# Patient Record
Sex: Female | Born: 1940 | Race: White | Hispanic: No | Marital: Married | Smoking: Former smoker
Health system: Southern US, Community
[De-identification: ages and names within clinical notes are randomized; demographics above are authoritative.]

## PROBLEM LIST (undated history)

## (undated) DIAGNOSIS — E039 Hypothyroidism, unspecified: Secondary | ICD-10-CM

## (undated) DIAGNOSIS — K5790 Diverticulosis of intestine, part unspecified, without perforation or abscess without bleeding: Secondary | ICD-10-CM

## (undated) DIAGNOSIS — C4491 Basal cell carcinoma of skin, unspecified: Secondary | ICD-10-CM

## (undated) DIAGNOSIS — D649 Anemia, unspecified: Secondary | ICD-10-CM

## (undated) DIAGNOSIS — I509 Heart failure, unspecified: Secondary | ICD-10-CM

## (undated) DIAGNOSIS — G629 Polyneuropathy, unspecified: Secondary | ICD-10-CM

## (undated) DIAGNOSIS — M1990 Unspecified osteoarthritis, unspecified site: Secondary | ICD-10-CM

## (undated) DIAGNOSIS — Z9289 Personal history of other medical treatment: Secondary | ICD-10-CM

## (undated) DIAGNOSIS — K219 Gastro-esophageal reflux disease without esophagitis: Secondary | ICD-10-CM

## (undated) DIAGNOSIS — R112 Nausea with vomiting, unspecified: Secondary | ICD-10-CM

## (undated) DIAGNOSIS — I2510 Atherosclerotic heart disease of native coronary artery without angina pectoris: Secondary | ICD-10-CM

## (undated) DIAGNOSIS — Z9889 Other specified postprocedural states: Secondary | ICD-10-CM

---

## 1968-10-09 HISTORY — PX: THYROIDECTOMY: SHX17

## 1970-02-08 HISTORY — PX: ABDOMINAL HYSTERECTOMY: SHX81

## 1988-10-09 HISTORY — PX: BASAL CELL CARCINOMA EXCISION: SHX1214

## 2008-02-09 HISTORY — PX: CARDIAC CATHETERIZATION: SHX172

## 2008-02-09 HISTORY — PX: PERICARDIUM SURGERY: SHX742

## 2008-02-09 HISTORY — PX: CORONARY ARTERY BYPASS GRAFT: SHX141

## 2008-03-05 ENCOUNTER — Ambulatory Visit: Payer: Self-pay | Admitting: Thoracic Surgery (Cardiothoracic Vascular Surgery)

## 2008-04-29 ENCOUNTER — Ambulatory Visit: Payer: Self-pay | Admitting: Cardiothoracic Surgery

## 2008-05-27 ENCOUNTER — Ambulatory Visit: Payer: Self-pay | Admitting: Thoracic Surgery (Cardiothoracic Vascular Surgery)

## 2013-07-09 ENCOUNTER — Emergency Department (HOSPITAL_COMMUNITY): Payer: Medicare Other

## 2013-07-09 ENCOUNTER — Encounter (HOSPITAL_COMMUNITY): Payer: Self-pay | Admitting: Emergency Medicine

## 2013-07-09 ENCOUNTER — Inpatient Hospital Stay (HOSPITAL_COMMUNITY)
Admission: EM | Admit: 2013-07-09 | Discharge: 2013-07-26 | DRG: 291 | Disposition: A | Discharge status: Home / Self Care | Payer: Medicare Other | Attending: Internal Medicine | Admitting: Internal Medicine

## 2013-07-09 DIAGNOSIS — Z951 Presence of aortocoronary bypass graft: Secondary | ICD-10-CM

## 2013-07-09 DIAGNOSIS — E662 Morbid (severe) obesity with alveolar hypoventilation: Secondary | ICD-10-CM | POA: Diagnosis present

## 2013-07-09 DIAGNOSIS — I5033 Acute on chronic diastolic (congestive) heart failure: Principal | ICD-10-CM | POA: Diagnosis present

## 2013-07-09 DIAGNOSIS — J9600 Acute respiratory failure, unspecified whether with hypoxia or hypercapnia: Secondary | ICD-10-CM | POA: Diagnosis present

## 2013-07-09 DIAGNOSIS — E890 Postprocedural hypothyroidism: Secondary | ICD-10-CM | POA: Diagnosis present

## 2013-07-09 DIAGNOSIS — I2510 Atherosclerotic heart disease of native coronary artery without angina pectoris: Secondary | ICD-10-CM | POA: Diagnosis present

## 2013-07-09 DIAGNOSIS — D649 Anemia, unspecified: Secondary | ICD-10-CM | POA: Diagnosis present

## 2013-07-09 DIAGNOSIS — I509 Heart failure, unspecified: Secondary | ICD-10-CM | POA: Diagnosis present

## 2013-07-09 DIAGNOSIS — E876 Hypokalemia: Secondary | ICD-10-CM | POA: Diagnosis present

## 2013-07-09 DIAGNOSIS — G4733 Obstructive sleep apnea (adult) (pediatric): Secondary | ICD-10-CM | POA: Diagnosis present

## 2013-07-09 DIAGNOSIS — I10 Essential (primary) hypertension: Secondary | ICD-10-CM | POA: Diagnosis present

## 2013-07-09 DIAGNOSIS — Z87891 Personal history of nicotine dependence: Secondary | ICD-10-CM

## 2013-07-09 DIAGNOSIS — Z6826 Body mass index (BMI) 26.0-26.9, adult: Secondary | ICD-10-CM

## 2013-07-09 DIAGNOSIS — Z952 Presence of prosthetic heart valve: Secondary | ICD-10-CM

## 2013-07-09 DIAGNOSIS — I959 Hypotension, unspecified: Secondary | ICD-10-CM | POA: Diagnosis present

## 2013-07-09 DIAGNOSIS — I050 Rheumatic mitral stenosis: Secondary | ICD-10-CM

## 2013-07-09 DIAGNOSIS — I5043 Acute on chronic combined systolic (congestive) and diastolic (congestive) heart failure: Secondary | ICD-10-CM

## 2013-07-09 DIAGNOSIS — E039 Hypothyroidism, unspecified: Secondary | ICD-10-CM

## 2013-07-09 DIAGNOSIS — J9601 Acute respiratory failure with hypoxia: Secondary | ICD-10-CM

## 2013-07-09 DIAGNOSIS — Z880 Allergy status to penicillin: Secondary | ICD-10-CM

## 2013-07-09 DIAGNOSIS — G609 Hereditary and idiopathic neuropathy, unspecified: Secondary | ICD-10-CM | POA: Diagnosis present

## 2013-07-09 DIAGNOSIS — G589 Mononeuropathy, unspecified: Secondary | ICD-10-CM | POA: Diagnosis present

## 2013-07-09 LAB — COMPREHENSIVE METABOLIC PANEL
ALT: 10 U/L (ref 0–35)
AST: 27 U/L (ref 0–37)
Albumin: 3.2 g/dL — ABNORMAL LOW (ref 3.5–5.2)
Alkaline Phosphatase: 148 U/L — ABNORMAL HIGH (ref 39–117)
BUN: 13 mg/dL (ref 6–23)
CALCIUM: 8.7 mg/dL (ref 8.4–10.5)
CO2: 19 meq/L (ref 19–32)
Chloride: 98 mEq/L (ref 96–112)
Creatinine, Ser: 0.75 mg/dL (ref 0.50–1.10)
GFR, EST NON AFRICAN AMERICAN: 82 mL/min — AB (ref >90)
GLUCOSE: 92 mg/dL (ref 70–99)
Potassium: 4.2 mEq/L (ref 3.7–5.3)
SODIUM: 133 meq/L — AB (ref 137–147)
Total Bilirubin: 1.2 mg/dL (ref 0.3–1.2)
Total Protein: 6.7 g/dL (ref 6.0–8.3)

## 2013-07-09 LAB — CBC
HCT: 26 % — ABNORMAL LOW (ref 36.0–46.0)
Hemoglobin: 8.1 g/dL — ABNORMAL LOW (ref 12.0–15.0)
MCH: 27.5 pg (ref 26.0–34.0)
MCHC: 31.2 g/dL (ref 30.0–36.0)
MCV: 88.1 fL (ref 78.0–100.0)
Platelets: 156 10*3/uL (ref 150–400)
RBC: 2.95 MIL/uL — ABNORMAL LOW (ref 3.87–5.11)
RDW: 22 % — AB (ref 11.5–15.5)
WBC: 4.8 10*3/uL (ref 4.0–10.5)

## 2013-07-09 LAB — CBC WITH DIFFERENTIAL/PLATELET
BASOS PCT: 0 % (ref 0–1)
Basophils Absolute: 0 10*3/uL (ref 0.0–0.1)
EOS PCT: 2 % (ref 0–5)
Eosinophils Absolute: 0.1 10*3/uL (ref 0.0–0.7)
HEMATOCRIT: 28.2 % — AB (ref 36.0–46.0)
HEMOGLOBIN: 8.7 g/dL — AB (ref 12.0–15.0)
LYMPHS ABS: 1.1 10*3/uL (ref 0.7–4.0)
Lymphocytes Relative: 23 % (ref 12–46)
MCH: 27.6 pg (ref 26.0–34.0)
MCHC: 30.9 g/dL (ref 30.0–36.0)
MCV: 89.5 fL (ref 78.0–100.0)
MONO ABS: 0.5 10*3/uL (ref 0.1–1.0)
Monocytes Relative: 11 % (ref 3–12)
NEUTROS ABS: 3.1 10*3/uL (ref 1.7–7.7)
NEUTROS PCT: 64 % (ref 43–77)
Platelets: 149 10*3/uL — ABNORMAL LOW (ref 150–400)
RBC: 3.15 MIL/uL — AB (ref 3.87–5.11)
RDW: 21.9 % — ABNORMAL HIGH (ref 11.5–15.5)
WBC: 4.8 10*3/uL (ref 4.0–10.5)

## 2013-07-09 LAB — D-DIMER, QUANTITATIVE (NOT AT ARMC): D DIMER QUANT: 2.08 ug{FEU}/mL — AB (ref 0.00–0.48)

## 2013-07-09 LAB — TROPONIN I
Troponin I: <0.3 ng/mL (ref <0.30)
Troponin I: <0.3 ng/mL (ref <0.30)

## 2013-07-09 LAB — LIPID PANEL
Cholesterol: 156 mg/dL (ref 0–200)
HDL: 34 mg/dL — ABNORMAL LOW (ref >39)
LDL Cholesterol: 104 mg/dL — ABNORMAL HIGH (ref 0–99)
Total CHOL/HDL Ratio: 4.6 RATIO
Triglycerides: 89 mg/dL (ref <150)
VLDL: 18 mg/dL (ref 0–40)

## 2013-07-09 LAB — PRO B NATRIURETIC PEPTIDE: PRO B NATRI PEPTIDE: 1673 pg/mL — AB (ref 0–125)

## 2013-07-09 LAB — CREATININE, SERUM
Creatinine, Ser: 0.76 mg/dL (ref 0.50–1.10)
GFR calc Af Amer: >90 mL/min (ref >90)
GFR calc non Af Amer: 82 mL/min — ABNORMAL LOW (ref >90)

## 2013-07-09 LAB — HEMOGLOBIN A1C
HEMOGLOBIN A1C: 5.1 % (ref <5.7)
MEAN PLASMA GLUCOSE: 100 mg/dL (ref <117)

## 2013-07-09 LAB — TSH: TSH: 7.3 u[IU]/mL — AB (ref 0.350–4.500)

## 2013-07-09 MED ORDER — FERROUS SULFATE 325 (65 FE) MG PO TABS
325.0000 mg | ORAL_TABLET | Freq: Every day | ORAL | Status: DC
  Administered 2013-07-10 – 2013-07-26 (×17): 325 mg via ORAL
  Filled 2013-07-09 (×20): qty 1

## 2013-07-09 MED ORDER — LOSARTAN POTASSIUM 25 MG PO TABS: 25.0000 mg | ORAL_TABLET | Freq: Every day | ORAL | Status: DC

## 2013-07-09 MED ORDER — METOPROLOL TARTRATE 25 MG PO TABS
25.0000 mg | ORAL_TABLET | Freq: Two times a day (BID) | ORAL | Status: DC
  Administered 2013-07-09 – 2013-07-12 (×7): 25 mg via ORAL
  Filled 2013-07-09 (×9): qty 1

## 2013-07-09 MED ORDER — GABAPENTIN 300 MG PO CAPS
300.0000 mg | ORAL_CAPSULE | Freq: Every day | ORAL | Status: DC
  Administered 2013-07-09 – 2013-07-25 (×17): 300 mg via ORAL
  Filled 2013-07-09 (×18): qty 1

## 2013-07-09 MED ORDER — ACETAMINOPHEN 325 MG PO TABS
650.0000 mg | ORAL_TABLET | Freq: Four times a day (QID) | ORAL | Status: DC | PRN
  Administered 2013-07-09 – 2013-07-23 (×20): 650 mg via ORAL
  Filled 2013-07-09 (×20): qty 2

## 2013-07-09 MED ORDER — LISINOPRIL 5 MG PO TABS
5.0000 mg | ORAL_TABLET | Freq: Every day | ORAL | Status: DC
  Filled 2013-07-09: qty 1

## 2013-07-09 MED ORDER — SODIUM CHLORIDE 0.9 % IJ SOLN
3.0000 mL | INTRAMUSCULAR | Status: DC | PRN
  Administered 2013-07-16: 3 mL via INTRAVENOUS

## 2013-07-09 MED ORDER — POTASSIUM CHLORIDE CRYS ER 20 MEQ PO TBCR
20.0000 meq | EXTENDED_RELEASE_TABLET | Freq: Two times a day (BID) | ORAL | Status: DC
  Administered 2013-07-09 – 2013-07-13 (×9): 20 meq via ORAL
  Filled 2013-07-09 (×14): qty 1

## 2013-07-09 MED ORDER — AMIODARONE HCL 200 MG PO TABS
200.0000 mg | ORAL_TABLET | Freq: Every day | ORAL | Status: DC
  Administered 2013-07-10: 200 mg via ORAL
  Filled 2013-07-09: qty 1

## 2013-07-09 MED ORDER — FUROSEMIDE 10 MG/ML IJ SOLN: 40.0000 mg | Freq: Two times a day (BID) | INTRAMUSCULAR | Status: DC

## 2013-07-09 MED ORDER — FUROSEMIDE 10 MG/ML IJ SOLN
40.0000 mg | Freq: Once | INTRAMUSCULAR | Status: AC
  Administered 2013-07-09: 40 mg via INTRAVENOUS
  Filled 2013-07-09: qty 4

## 2013-07-09 MED ORDER — SODIUM CHLORIDE 0.9 % IJ SOLN
3.0000 mL | Freq: Two times a day (BID) | INTRAMUSCULAR | Status: DC
  Administered 2013-07-09 – 2013-07-18 (×13): 3 mL via INTRAVENOUS

## 2013-07-09 MED ORDER — SODIUM CHLORIDE 0.9 % IJ SOLN
3.0000 mL | Freq: Two times a day (BID) | INTRAMUSCULAR | Status: DC
  Administered 2013-07-09 – 2013-07-25 (×24): 3 mL via INTRAVENOUS

## 2013-07-09 MED ORDER — SODIUM CHLORIDE 0.9 % IV SOLN
250.0000 mL | INTRAVENOUS | Status: DC | PRN
  Administered 2013-07-10: 250 mL via INTRAVENOUS

## 2013-07-09 MED ORDER — LEVOTHYROXINE SODIUM 200 MCG PO TABS
200.0000 ug | ORAL_TABLET | Freq: Every day | ORAL | Status: DC
  Administered 2013-07-10: 200 ug via ORAL
  Filled 2013-07-09 (×3): qty 1

## 2013-07-09 MED ORDER — HEPARIN SODIUM (PORCINE) 5000 UNIT/ML IJ SOLN
5000.0000 [IU] | Freq: Three times a day (TID) | INTRAMUSCULAR | Status: DC
  Administered 2013-07-09 – 2013-07-25 (×48): 5000 [IU] via SUBCUTANEOUS
  Filled 2013-07-09 (×56): qty 1

## 2013-07-09 NOTE — ED Notes (Signed)
Ordered diet tray 

## 2013-07-09 NOTE — ED Provider Notes (Signed)
CSN: [id number]     Arrival date & time 07/09/13  1412 History   First MD Initiated Contact with Patient 07/09/13 1509     Chief Complaint  Patient presents with  . Shortness of Breath  . Leg Swelling     (Consider location/radiation/quality/duration/timing/severity/associated sxs/prior Treatment) HPI Comments: Patient is a 73 year old female with history of aortic valve replacement and congestive heart failure. She presents today with complaints of shortness of breath and increased lower extremity swelling. This has been occurring intermittently over the past 6 weeks. Daughter reports that she has been hospitalized at [facility] 4 times during this period of time. She states that she is given Lasix and her swelling improves. She'll after she arrives home the swelling increases and she becomes more short of breath. Her cardiologist is Dr. Sherrian Divers at [facility]. They came here today as they seem frustrated with a lack of explanation for why her swelling continues to come back.  Patient is a 73 y.o. female presenting with shortness of breath. The history is provided by the patient and a relative.  Shortness of Breath Severity:  Moderate Onset quality:  Gradual Duration:  2 days Timing:  Constant Progression:  Worsening Chronicity:  New Context: activity   Relieved by:  Nothing Worsened by:  Nothing tried Ineffective treatments:  None tried   No past medical history on file. No past surgical history on file. No family history on file. History  Substance Use Topics  . Smoking status: Not on file  . Smokeless tobacco: Not on file  . Alcohol Use: Not on file   OB History   No data available     Review of Systems  Respiratory: Positive for shortness of breath.   All other systems reviewed and are negative.     Allergies  Penicillins  Home Medications   Prior to Admission medications   Medication Sig Start Date End Date Taking? Authorizing Provider   amiodarone (PACERONE) 200 MG tablet Take 200 mg by mouth daily. 07/06/13  Yes Historical Provider, MD  ferrous sulfate 325 (65 FE) MG tablet Take 325 mg by mouth daily with breakfast.   Yes Historical Provider, MD  furosemide (LASIX) 40 MG tablet Take 20 mg by mouth daily. 07/02/13  Yes Historical Provider, MD  gabapentin (NEURONTIN) 300 MG capsule Take 300 mg by mouth at bedtime. 05/31/13  Yes Historical Provider, MD  levothyroxine (SYNTHROID, LEVOTHROID) 200 MCG tablet Take 200 mcg by mouth daily before breakfast.   Yes Historical Provider, MD  lisinopril (PRINIVIL,ZESTRIL) 5 MG tablet Take 5 mg by mouth daily. 05/10/13  Yes Historical Provider, MD  LORazepam (ATIVAN) 1 MG tablet Take 1 mg by mouth at bedtime as needed. sleep 06/01/13  Yes Historical Provider, MD  losartan (COZAAR) 25 MG tablet Take 25 mg by mouth daily. 06/17/13  Yes Historical Provider, MD  metoprolol tartrate (LOPRESSOR) 25 MG tablet Take 25 mg by mouth 2 (two) times daily. 05/04/13  Yes Historical Provider, MD  potassium chloride SA (K-DUR,KLOR-CON) 20 MEQ tablet Take 20 mEq by mouth 2 (two) times daily. 07/02/13  Yes Historical Provider, MD   BP 108/53  Resp 16  Ht 5' 8.5" (1.74 m)  Wt 214 lb (97.07 kg)  BMI 32.06 kg/m2  SpO2 97% Physical Exam  Nursing note and vitals reviewed. Constitutional: She is oriented to person, place, and time. She appears well-developed and well-nourished. No distress.  HENT:  Head: Normocephalic and atraumatic.  Neck: Normal range of motion. Neck  supple.  Cardiovascular: Normal rate and regular rhythm.  Exam reveals no gallop and no friction rub.   No murmur heard. Pulmonary/Chest: Effort normal and breath sounds normal. No respiratory distress. She has no wheezes.  Abdominal: Soft. Bowel sounds are normal. She exhibits no distension. There is no tenderness.  Musculoskeletal: Normal range of motion. She exhibits edema.  There is 2-3+ bilateral lower extremity edema. There is erythema and warmth  to the anterior aspects of both lower legs. Dorsalis pedis pulses are palpable bilaterally.  Neurological: She is alert and oriented to person, place, and time.  Skin: Skin is warm and dry. She is not diaphoretic.    ED Course  Procedures (including critical care time) Labs Review Labs Reviewed  CBC WITH DIFFERENTIAL  COMPREHENSIVE METABOLIC PANEL  PRO B NATRIURETIC PEPTIDE  TROPONIN I    Imaging Review No results found.   EKG Interpretation   Date/Time:  Monday July 09 2013 14:21:56 EDT Ventricular Rate:  83 PR Interval:  254 QRS Duration: 142 QT Interval:  467 QTC Calculation: 549 R Axis:   98 Text Interpretation:  Sinus rhythm Prolonged PR interval Right bundle  branch block No prior ecg for comparison Confirmed by DELOS  MD, Megon Kalina  ([id number]) on 07/09/2013 3:22:05 PM      MDM   Final diagnoses:  None    Patient is a 72 year old female with history of congestive heart failure. She presents with lower extremity edema and difficulty breathing. She does have a hypoxic with O2 saturations of 90%. This improved with supplemental oxygen. Workup reveals an elevated BNP, negative troponin and chest x-ray that is reflective of a CHF exacerbation. She was given intravenous Lasix with good diuresis. Do to her hypoxia, however I feel as though admission is indicated. I've spoken with Dr. Ernestina Patches from [facility] who agrees to admit the patient.   Veryl Speak, MD 07/10/13 1530

## 2013-07-09 NOTE — ED Notes (Signed)
Pt in from home c/o SOB onset today with bil lower extremity swelling worsening with redness below the knee bilaterally, pt reports having abdominal swelling more so than baseline, pt has lasix 40 mg Rx, daughter reports her taking 20 mg Lasix, pt denies CP, n/v/d, hx of aortic valve replacement, pt A&O x4, follows commands, speaks in complete sentences, pt on 2L [employer] upon arrival to ED

## 2013-07-09 NOTE — Progress Notes (Signed)
Patient c/o generalized pain.  4/10.  MD made aware and tylenol ordered per MD. RN will continue to monitor. Shellee Milo, RN

## 2013-07-09 NOTE — H&P (Signed)
Hospitalist Admission History and Physical  Patient name: Nicole Hansen record number: [id number] Date of birth: [birth year] Age: 73 y.o. Gender: female  Primary Care Provider: Rubie Maid, MD  Chief Complaint: acute resp failure, edema, ? CHF  History of Present Illness:This is a 73 y.o. 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 Patient gets her care to [facility]. Patient states that she was seen back in March for an upper respiratory infection by her primary care physician. Was put on antibiotics and steroids. Patient had a significant amount of weight gain with steroids and has been subsequently hospitalized several times for shortness of breath. Station she's been seen several times by the cardiologist for swelling and dyspnea. States she has had normal echocardiograms. Was instructed to take higher doses of Lasix and she would like. Daughter and patient state that she can only tolerate up to 20 mg of Lasix daily. Otherwise she urinates excessively on the floor. Patient states she's gained up to 50 pounds of weight since initial steroids dosing. Still up with 30-40 pounds with the 20 mg of Lasix. Denies any chest pain. Has had some chest pressure. Positive orthopnea. Positive lower extremity swelling. States that she was also transfused 4 units of blood and [place] within the past month. Had an upper and lower endoscopy that was negative for any bleeding.  Presented to the ER today hemodynamically stable. Pressures in the 100s to 120s. Satting 90-95% on room air. White blood cell count 4.8, hemoglobin 8.7 creatinine 0.75. Pro BNP 1673. Trop WNL.    Assessment and Plan: Nicole Hansen is a 73 y.o. year old female presenting with acute resp failure, edema   Active  Problems:   Acute respiratory failure   Acute Resp Failure/Edema: Overall presentation concerning for CHF flare. Ddx also includes VTE, obstructive lung disease given body habitus and decreased mobility. Prior 20 pack year smoker in remote past (quit in 49s). No wheezing on exam, though rhoncii present. Steroids relatively contraindicated given edema as secondary side effect in the past. Check D dimer. Will diurese with IV lasix in the interim. . Check 2D ECHO. Cardiology consult to reeval overall case. Cycle CEs. Risk stratification labs. Telemetry bed. Supplemental O2 prn.   HTN: Continue home regimen. Hold ARB and ACE in interim pending diurese.   FEN/GI: heart healthy. Low sodium diet.  Prophylaxis: sub q heparin Disposition: pending further evaluation.  Code Status:Full Code    Patient Active Problem List   Diagnosis Date Noted  . Acute respiratory failure 07/09/2013   Past Medical History: No past medical history on file.  Past Surgical History: No past surgical history on file.  Social History: History   Social History  . Marital Status: Married    Spouse Name: N/A    Number of Children: N/A  . Years of Education: N/A   Social History Main Topics  . Smoking status: None  . Smokeless tobacco: None  . Alcohol Use: No  . Drug Use: No  . Sexual Activity: None   Other Topics Concern  . None   Social History Narrative  . None    Family History: No family history on file.  Allergies: Allergies  Allergen Reactions  . Penicillins Rash    Current Facility-Administered Medications  Medication Dose Route Frequency Provider Last Rate Last Dose  . 0.9 %  sodium chloride infusion  250 mL Intravenous PRN Shanda Howells, MD      . amiodarone (PACERONE) tablet 200 mg  200 mg Oral Daily Shanda Howells, MD      . Derrill Memo ON 07/10/2013] ferrous sulfate tablet 325 mg  325 mg Oral Q breakfast Shanda Howells, MD      . Derrill Memo ON 07/10/2013] furosemide (LASIX) injection 40 mg   40 mg Intravenous Q12H Shanda Howells, MD      . gabapentin (NEURONTIN) capsule 300 mg  300 mg Oral QHS Shanda Howells, MD      . heparin injection 5,000 Units  5,000 Units Subcutaneous 3 times per day Shanda Howells, MD      . Derrill Memo ON 07/10/2013] levothyroxine (SYNTHROID, LEVOTHROID) tablet 200 mcg  200 mcg Oral QAC breakfast Shanda Howells, MD      . lisinopril (PRINIVIL,ZESTRIL) tablet 5 mg  5 mg Oral Daily Shanda Howells, MD      . losartan (COZAAR) tablet 25 mg  25 mg Oral Daily Shanda Howells, MD      . metoprolol tartrate (LOPRESSOR) tablet 25 mg  25 mg Oral BID Shanda Howells, MD      . potassium chloride SA (K-DUR,KLOR-CON) CR tablet 20 mEq  20 mEq Oral BID Shanda Howells, MD      . sodium chloride 0.9 % injection 3 mL  3 mL Intravenous Q12H Shanda Howells, MD      . sodium chloride 0.9 % injection 3 mL  3 mL Intravenous Q12H Shanda Howells, MD      . sodium chloride 0.9 % injection 3 mL  3 mL Intravenous PRN Shanda Howells, MD       Current Outpatient Prescriptions  Medication Sig Dispense Refill  . amiodarone (PACERONE) 200 MG tablet Take 200 mg by mouth daily.      . ferrous sulfate 325 (65 FE) MG tablet Take 325 mg by mouth daily with breakfast.      . furosemide (LASIX) 40 MG tablet Take 20 mg by mouth daily.      Marland Kitchen gabapentin (NEURONTIN) 300 MG capsule Take 300 mg by mouth at bedtime.      Marland Kitchen levothyroxine (SYNTHROID, LEVOTHROID) 200 MCG tablet Take 200 mcg by mouth daily before breakfast.      . lisinopril (PRINIVIL,ZESTRIL) 5 MG tablet Take 5 mg by mouth daily.      Marland Kitchen LORazepam (ATIVAN) 1 MG tablet Take 1 mg by mouth at bedtime as needed. sleep      . losartan (COZAAR) 25 MG tablet Take 25 mg by mouth daily.      . metoprolol tartrate (LOPRESSOR) 25 MG tablet Take 25 mg by mouth 2 (two) times daily.      . potassium chloride SA (K-DUR,KLOR-CON) 20 MEQ tablet Take 20 mEq by mouth 2 (two) times daily.       Review Of Systems: 12 point ROS negative except as noted above in HPI.  Physical  Exam: Filed Vitals:   07/09/13 1834  BP: 120/62  Pulse: 88  Resp: 21    General: alert, cooperative, morbidly obese and mild distress HEENT: PERRLA and extra ocular movement intact Heart: S1, S2 normal, no murmur, rub or gallop, regular rate and rhythm Lungs: mild rhoncii diffusely Abdomen: obese abdomen, non tender, + bowel sounds  Extremities: venous stasis dermatitis noted and 2+ peripheral pulses, Skin:venous stasis changes on LEs bilaterally  Neurology: normal without focal findings  Labs and Imaging: Lab Results  Component Value Date/Time   NA  133* 07/09/2013  4:02 PM   K 4.2 07/09/2013  4:02 PM   CL 98 07/09/2013  4:02 PM   CO2 19 07/09/2013  4:02 PM   BUN 13 07/09/2013  4:02 PM   CREATININE 0.75 07/09/2013  4:02 PM   GLUCOSE 92 07/09/2013  4:02 PM   Lab Results  Component Value Date   WBC 4.8 07/09/2013   HGB 8.7* 07/09/2013   HCT 28.2* 07/09/2013   MCV 89.5 07/09/2013   PLT 149* 07/09/2013   EKG: NSR, RBBB   Dg Chest 2 View  07/09/2013   CLINICAL DATA:  Short of breath, increasing lower extremity swelling  EXAM: CHEST  2 VIEW  COMPARISON:  Prior chest x-ray 06/25/2013  FINDINGS: Stable cardiomegaly and mediastinal contours. Atherosclerotic calcifications present within the transverse aorta. Patient is status post median sternotomy with evidence of prior CABG and aortic valve replacement. Pulmonary vascular congestion with mild pulmonary edema but no significant interval progression compared to prior. Small bilateral right larger than left layering pleural effusions with associated bibasilar atelectasis. No acute osseous abnormality.  IMPRESSION: Similar appearance of mild compared to 06/25/2013.  Small right larger than left pleural effusions.   Electronically Signed   By: Jacqulynn Cadet M.D.   On: 07/09/2013 17:42           Shanda Howells MD  Pager: [phone]

## 2013-07-10 ENCOUNTER — Encounter (HOSPITAL_COMMUNITY): Payer: Self-pay | Admitting: Radiology

## 2013-07-10 ENCOUNTER — Observation Stay (HOSPITAL_COMMUNITY): Payer: Medicare Other

## 2013-07-10 DIAGNOSIS — I059 Rheumatic mitral valve disease, unspecified: Secondary | ICD-10-CM

## 2013-07-10 DIAGNOSIS — I959 Hypotension, unspecified: Secondary | ICD-10-CM | POA: Diagnosis present

## 2013-07-10 DIAGNOSIS — I10 Essential (primary) hypertension: Secondary | ICD-10-CM

## 2013-07-10 DIAGNOSIS — J9600 Acute respiratory failure, unspecified whether with hypoxia or hypercapnia: Secondary | ICD-10-CM

## 2013-07-10 LAB — COMPREHENSIVE METABOLIC PANEL
ALT: 9 U/L (ref 0–35)
AST: 24 U/L (ref 0–37)
Albumin: 2.9 g/dL — ABNORMAL LOW (ref 3.5–5.2)
Alkaline Phosphatase: 132 U/L — ABNORMAL HIGH (ref 39–117)
BILIRUBIN TOTAL: 0.9 mg/dL (ref 0.3–1.2)
BUN: 13 mg/dL (ref 6–23)
CHLORIDE: 100 meq/L (ref 96–112)
CO2: 23 meq/L (ref 19–32)
CREATININE: 0.9 mg/dL (ref 0.50–1.10)
Calcium: 8.3 mg/dL — ABNORMAL LOW (ref 8.4–10.5)
GFR calc Af Amer: 72 mL/min — ABNORMAL LOW (ref >90)
GFR, EST NON AFRICAN AMERICAN: 62 mL/min — AB (ref >90)
Glucose, Bld: 93 mg/dL (ref 70–99)
Potassium: 4.4 mEq/L (ref 3.7–5.3)
Sodium: 135 mEq/L — ABNORMAL LOW (ref 137–147)
Total Protein: 5.9 g/dL — ABNORMAL LOW (ref 6.0–8.3)

## 2013-07-10 LAB — CBC WITH DIFFERENTIAL/PLATELET
Basophils Absolute: 0.1 10*3/uL (ref 0.0–0.1)
Basophils Relative: 1 % (ref 0–1)
EOS PCT: 2 % (ref 0–5)
Eosinophils Absolute: 0.1 10*3/uL (ref 0.0–0.7)
HCT: 25 % — ABNORMAL LOW (ref 36.0–46.0)
Hemoglobin: 7.9 g/dL — ABNORMAL LOW (ref 12.0–15.0)
Lymphocytes Relative: 20 % (ref 12–46)
Lymphs Abs: 1 10*3/uL (ref 0.7–4.0)
MCH: 28 pg (ref 26.0–34.0)
MCHC: 31.6 g/dL (ref 30.0–36.0)
MCV: 88.7 fL (ref 78.0–100.0)
MONO ABS: 0.6 10*3/uL (ref 0.1–1.0)
MONOS PCT: 12 % (ref 3–12)
Neutro Abs: 3.4 10*3/uL (ref 1.7–7.7)
Neutrophils Relative %: 65 % (ref 43–77)
PLATELETS: 139 10*3/uL — AB (ref 150–400)
RBC: 2.82 MIL/uL — AB (ref 3.87–5.11)
RDW: 21.8 % — ABNORMAL HIGH (ref 11.5–15.5)
WBC: 5.2 10*3/uL (ref 4.0–10.5)

## 2013-07-10 LAB — TROPONIN I: Troponin I: <0.3 ng/mL (ref <0.30)

## 2013-07-10 MED ORDER — LORAZEPAM 0.5 MG PO TABS
0.5000 mg | ORAL_TABLET | Freq: Once | ORAL | Status: AC
  Administered 2013-07-10: 0.5 mg via ORAL
  Filled 2013-07-10: qty 1

## 2013-07-10 MED ORDER — IOHEXOL 350 MG/ML SOLN
80.0000 mL | Freq: Once | INTRAVENOUS | Status: AC | PRN
  Administered 2013-07-10: 59 mL via INTRAVENOUS

## 2013-07-10 MED ORDER — FUROSEMIDE 10 MG/ML IJ SOLN
40.0000 mg | Freq: Two times a day (BID) | INTRAMUSCULAR | Status: DC
Start: 2013-07-10 — End: 2013-07-10
  Filled 2013-07-10 (×2): qty 4

## 2013-07-10 MED ORDER — DEXTROSE 5 % IV SOLN
8.0000 mg/h | INTRAVENOUS | Status: DC
  Administered 2013-07-10: 4 mg/h via INTRAVENOUS
  Administered 2013-07-11: 8 mg/h via INTRAVENOUS
  Filled 2013-07-10 (×5): qty 25

## 2013-07-10 MED ORDER — GUAIFENESIN-DM 100-10 MG/5ML PO SYRP
5.0000 mL | ORAL_SOLUTION | ORAL | Status: DC | PRN
  Administered 2013-07-10 – 2013-07-25 (×17): 5 mL via ORAL
  Filled 2013-07-10 (×17): qty 5

## 2013-07-10 NOTE — Progress Notes (Signed)
[facility] PROGRESS NOTE  Filed Weights   07/09/13 1421 07/09/13 2129 07/10/13 0505  Weight: 97.07 kg (214 lb) 97.796 kg (215 lb 9.6 oz) 96.888 kg (213 lb 9.6 oz)        Intake/Output Summary (Last 24 hours) at 07/10/13 1101 Last data filed at 07/10/13 0840  Gross per 24 hour  Intake    650 ml  Output    700 ml  Net    -50 ml     Assessment/Plan:  Acute respiratory failure due to HF: - Change to lasix drip, ted hose. Leave pt head down feet up. - daily weight, fluid restriction, low sodium diet. - monitor electrolytes, replete as needed. - She is massively fluid overloaded. - cardiac markers negative x 3.   Essential HTN: - cont home meds.  Code Status: full Family Communication: none  Disposition Plan: inpatient   Consultants:  none  Procedures: ECHO: pending  Antibiotics:  none (indicate start date, and stop date if known)  HPI/Subjective: SOB with ambulation  Objective: Filed Vitals:   07/09/13 1956 07/09/13 2129 07/10/13 0505 07/10/13 1024  BP: 132/76  100/40 107/51  Pulse: 92   82  Temp: 97.5 F (36.4 C)  97.8 F (36.6 C)   TempSrc: Oral  Axillary   Resp: 20  18   Height:  5\' 8"  (1.727 m)    Weight:  97.796 kg (215 lb 9.6 oz) 96.888 kg (213 lb 9.6 oz)   SpO2: 93%  96%      Exam:  General: Alert, awake, oriented x3, in no acute distress.  HEENT: No bruits, no goiter. +JVD Heart: Regular rate and rhythm, 3+ edema, she is also edematous in her abdomen. Lungs: Good air movement, crackles at bases Abdomen: Soft, nontender, nondistended, positive bowel sounds.   Data Reviewed: Basic Metabolic Panel:  Recent Labs Lab 07/09/13 1602 07/09/13 1932 07/10/13 0217  NA 133*  --  135*  K 4.2  --  4.4  CL 98  --  100  CO2 19  --  23  GLUCOSE 92  --  93  BUN 13  --  13  CREATININE 0.75 0.76 0.90  CALCIUM 8.7  --  8.3*   Liver Function Tests:  Recent Labs Lab 07/09/13 1602 07/10/13 0217  AST 27 24  ALT 10 9  ALKPHOS 148*  132*  BILITOT 1.2 0.9  PROT 6.7 5.9*  ALBUMIN 3.2* 2.9*   No results found for this basename: LIPASE, AMYLASE,  in the last 168 hours No results found for this basename: AMMONIA,  in the last 168 hours CBC:  Recent Labs Lab 07/09/13 1602 07/09/13 1932 07/10/13 0217  WBC 4.8 4.8 5.2  NEUTROABS 3.1  --  3.4  HGB 8.7* 8.1* 7.9*  HCT 28.2* 26.0* 25.0*  MCV 89.5 88.1 88.7  PLT 149* 156 139*   Cardiac Enzymes:  Recent Labs Lab 07/09/13 1602 07/09/13 1932 07/10/13 0217 07/10/13 0555  TROPONINI <0.30 <0.30 <0.30 <0.30   BNP (last 3 results)  Recent Labs  07/09/13 1602  PROBNP 1673.0*   CBG: No results found for this basename: GLUCAP,  in the last 168 hours  No results found for this or any previous visit (from the past 240 hour(s)).   Studies: Dg Chest 2 View  07/09/2013   CLINICAL DATA:  Short of breath, increasing lower extremity swelling  EXAM: CHEST  2 VIEW  COMPARISON:  Prior chest x-ray 06/25/2013  FINDINGS: Stable cardiomegaly and mediastinal contours. Atherosclerotic calcifications  present within the transverse aorta. Patient is status post median sternotomy with evidence of prior CABG and aortic valve replacement. Pulmonary vascular congestion with mild pulmonary edema but no significant interval progression compared to prior. Small bilateral right larger than left layering pleural effusions with associated bibasilar atelectasis. No acute osseous abnormality.  IMPRESSION: Similar appearance of mild compared to 06/25/2013.  Small right larger than left pleural effusions.   Electronically Signed   By: Jacqulynn Cadet M.D.   On: 07/09/2013 17:42   Ct Angio Chest Pe W/cm &/or Wo Cm  07/10/2013   CLINICAL DATA:  Dyspnea.  Elevated D-dimer.  EXAM: CT ANGIOGRAPHY CHEST WITH CONTRAST  TECHNIQUE: Multidetector CT imaging of the chest was performed using the standard protocol during bolus administration of intravenous contrast. Multiplanar CT image reconstructions and MIPs were  obtained to evaluate the vascular anatomy.  CONTRAST:  21mL OMNIPAQUE IOHEXOL 350 MG/ML SOLN  COMPARISON:  05/12/2008  FINDINGS: THORACIC INLET/BODY WALL:  Diffuse skin and subcutaneous edema.  MEDIASTINUM:  Cardiomegaly. No pericardial effusion. Patient is status post aortic valve replacement. There is bulky mitral annular calcification. Atherosclerosis, status post CABG. No adenopathy.  No evidence of pulmonary embolism. There is repeated respiratory motion which decreases sensitivity in evaluating the post segmental pulmonary arterial tree.  LUNG WINDOWS:  Moderate volume bilateral water density pleural effusions. There is some scalloping of the lung margins, but overall the fluid is simple appearing. There is dependent ground-glass density in the upper lungs, likely atelectasis. There is a small area of airspace disease in the lingula. Extensive lower lobe atelectasis. There is centrilobular emphysema at the apices, better seen on the previous examination.  UPPER ABDOMEN:  Lobulated liver contour and probable splenomegaly, findings which suggest cirrhosis.  OSSEOUS:  No acute fracture.  No suspicious lytic or blastic lesions.  Review of the MIP images confirms the above findings.  IMPRESSION: 1. Negative for pulmonary embolism to the proximal segmental level. 2. Moderate bilateral pleural effusions with extensive atelectasis. 3. Possible early pneumonia in the lingula. 4. Probable cirrhosis.   Electronically Signed   By: Jorje Guild M.D.   On: 07/10/2013 03:51    Scheduled Meds: . amiodarone  200 mg Oral Daily  . ferrous sulfate  325 mg Oral Q breakfast  . furosemide  40 mg Intravenous BID  . gabapentin  300 mg Oral QHS  . heparin  5,000 Units Subcutaneous 3 times per day  . levothyroxine  200 mcg Oral QAC breakfast  . metoprolol tartrate  25 mg Oral BID  . potassium chloride SA  20 mEq Oral BID  . sodium chloride  3 mL Intravenous Q12H  . sodium chloride  3 mL Intravenous Q12H   Continuous  Infusions:    Charlynne Cousins  [facility] Pager [phone] If 8PM-8AM, please contact night-coverage at [email or web address], password [facility] 07/10/2013, 11:01 AM  LOS: 1 day     **Disclaimer: This note may have been dictated with voice recognition software. Similar sounding words can inadvertently be transcribed and this note may contain transcription errors which may not have been corrected upon publication of note.**

## 2013-07-10 NOTE — Progress Notes (Signed)
Notified by central monitoring that patient was in vent. Tach.   Checked patient and patient was asymptomatic.  Left Upper lead was off and patient was on [facility].  Patient has no complaints.

## 2013-07-10 NOTE — Progress Notes (Signed)
  Echocardiogram 2D Echocardiogram has been performed.  Nicole Hansen Lachlan Mckim 07/10/2013, 12:27 PM

## 2013-07-10 NOTE — Progress Notes (Signed)
[facility]  Patient Status: Active (receiving services up to time of hospitalization)  [facility] is providing the following services: RN, PT and OT  If patient discharges after hours, please call [phone].   Nicole Hansen 07/10/2013, 11:01 AM

## 2013-07-10 NOTE — Progress Notes (Signed)
UR completed. Patient changed to inpatient- requiring IV lasix gtt

## 2013-07-10 NOTE — Progress Notes (Signed)
Patient HGB decreased to 7.9.  NP on call made aware. RN will continue to monitor. Shellee Milo, RN

## 2013-07-11 DIAGNOSIS — G609 Hereditary and idiopathic neuropathy, unspecified: Secondary | ICD-10-CM

## 2013-07-11 DIAGNOSIS — M7989 Other specified soft tissue disorders: Secondary | ICD-10-CM

## 2013-07-11 DIAGNOSIS — I5043 Acute on chronic combined systolic (congestive) and diastolic (congestive) heart failure: Secondary | ICD-10-CM

## 2013-07-11 DIAGNOSIS — R0602 Shortness of breath: Secondary | ICD-10-CM

## 2013-07-11 DIAGNOSIS — E039 Hypothyroidism, unspecified: Secondary | ICD-10-CM

## 2013-07-11 LAB — COMPREHENSIVE METABOLIC PANEL
ALBUMIN: 2.9 g/dL — AB (ref 3.5–5.2)
ALK PHOS: 133 U/L — AB (ref 39–117)
ALT: 10 U/L (ref 0–35)
AST: 27 U/L (ref 0–37)
BUN: 12 mg/dL (ref 6–23)
CHLORIDE: 96 meq/L (ref 96–112)
CO2: 22 mEq/L (ref 19–32)
Calcium: 8.3 mg/dL — ABNORMAL LOW (ref 8.4–10.5)
Creatinine, Ser: 0.75 mg/dL (ref 0.50–1.10)
GFR calc non Af Amer: 82 mL/min — ABNORMAL LOW (ref >90)
GLUCOSE: 97 mg/dL (ref 70–99)
Potassium: 4.4 mEq/L (ref 3.7–5.3)
Sodium: 133 mEq/L — ABNORMAL LOW (ref 137–147)
TOTAL PROTEIN: 5.9 g/dL — AB (ref 6.0–8.3)
Total Bilirubin: 0.9 mg/dL (ref 0.3–1.2)

## 2013-07-11 LAB — CBC WITH DIFFERENTIAL/PLATELET
BASOS ABS: 0.1 10*3/uL (ref 0.0–0.1)
Basophils Relative: 1 % (ref 0–1)
EOS ABS: 0.1 10*3/uL (ref 0.0–0.7)
Eosinophils Relative: 2 % (ref 0–5)
HCT: 26.3 % — ABNORMAL LOW (ref 36.0–46.0)
Hemoglobin: 8.3 g/dL — ABNORMAL LOW (ref 12.0–15.0)
LYMPHS ABS: 1.1 10*3/uL (ref 0.7–4.0)
Lymphocytes Relative: 21 % (ref 12–46)
MCH: 28.1 pg (ref 26.0–34.0)
MCHC: 31.6 g/dL (ref 30.0–36.0)
MCV: 89.2 fL (ref 78.0–100.0)
Monocytes Absolute: 0.7 10*3/uL (ref 0.1–1.0)
Monocytes Relative: 13 % — ABNORMAL HIGH (ref 3–12)
NEUTROS ABS: 3.1 10*3/uL (ref 1.7–7.7)
Neutrophils Relative %: 63 % (ref 43–77)
Platelets: 153 10*3/uL (ref 150–400)
RBC: 2.95 MIL/uL — ABNORMAL LOW (ref 3.87–5.11)
RDW: 21.8 % — AB (ref 11.5–15.5)
WBC: 5.1 10*3/uL (ref 4.0–10.5)

## 2013-07-11 MED ORDER — ONDANSETRON HCL 4 MG/2ML IJ SOLN
4.0000 mg | Freq: Four times a day (QID) | INTRAMUSCULAR | Status: DC | PRN
  Administered 2013-07-11: 4 mg via INTRAVENOUS
  Filled 2013-07-11: qty 2

## 2013-07-11 MED ORDER — LORAZEPAM 0.5 MG PO TABS
0.5000 mg | ORAL_TABLET | Freq: Every evening | ORAL | Status: DC | PRN
  Administered 2013-07-11 – 2013-07-12 (×2): 0.5 mg via ORAL
  Filled 2013-07-11 (×2): qty 1

## 2013-07-11 MED ORDER — METOLAZONE 2.5 MG PO TABS
2.5000 mg | ORAL_TABLET | Freq: Every day | ORAL | Status: DC
  Administered 2013-07-12 – 2013-07-16 (×5): 2.5 mg via ORAL
  Filled 2013-07-11 (×5): qty 1

## 2013-07-11 MED ORDER — BOOST PLUS PO LIQD
237.0000 mL | Freq: Two times a day (BID) | ORAL | Status: DC
  Administered 2013-07-12 – 2013-07-23 (×7): 237 mL via ORAL
  Filled 2013-07-11 (×33): qty 237

## 2013-07-11 MED ORDER — LEVOTHYROXINE SODIUM 200 MCG PO TABS
200.0000 ug | ORAL_TABLET | Freq: Every day | ORAL | Status: DC
  Administered 2013-07-11 – 2013-07-26 (×16): 200 ug via ORAL
  Filled 2013-07-11 (×18): qty 1

## 2013-07-11 MED ORDER — ALUM & MAG HYDROXIDE-SIMETH 200-200-20 MG/5ML PO SUSP
15.0000 mL | ORAL | Status: DC | PRN
  Administered 2013-07-11: 15 mL via ORAL
  Filled 2013-07-11: qty 30

## 2013-07-11 MED ORDER — FAMOTIDINE 20 MG PO TABS
20.0000 mg | ORAL_TABLET | Freq: Two times a day (BID) | ORAL | Status: DC | PRN
  Filled 2013-07-11: qty 1

## 2013-07-11 NOTE — Progress Notes (Signed)
[facility] PROGRESS NOTE  Filed Weights   07/09/13 2129 07/10/13 0505 07/11/13 0624  Weight: 97.796 kg (215 lb 9.6 oz) 96.888 kg (213 lb 9.6 oz) 97.251 kg (214 lb 6.4 oz)        Intake/Output Summary (Last 24 hours) at 07/11/13 2218 Last data filed at 07/11/13 1900  Gross per 24 hour  Intake    480 ml  Output   3040 ml  Net  -2560 ml     Assessment/Plan:  Acute respiratory failure due to acute on chronic diastolic HF: - Continue lasix drip and add metolazone -continue daily weight, fluid restriction, low sodium diet. -monitor electrolytes, replete as needed. -patient continue to be fluid overloaded. - cardiac markers negative x 3 and patient denies CP -continue TED hose   Essential HTN: - cont home antihypertensive meds. -BP stable  Anemia: -no signs of overt bleeding -continue ferrous sulfate -Hgb stable  Neuropathy: -continue gabapentin  Hypothyroidism: -TSH 7.3 -synthroid adjusted to 24mcg during this admission -will need repeat thyroid function test in 4 weeks   Code Status: full Family Communication: none  Disposition Plan: inpatient   Consultants:  none  Procedures:  ECHO:  - Left ventricle: The cavity size was normal. Wall thickness was normal. The estimated ejection fraction was 55%. - Aortic valve: There was mild stenosis. Valve area (Vmax): 0.88 cm^2. - Mitral valve: There is severe mitral annular calcificatin causing moderate stenosis across the mitral oriface There was mild regurgitation. Valve area by continuity equation (using LVOT flow): 0.92 cm^2. - Left atrium: The atrium was moderately dilated. - Right ventricle: The cavity size was mildly dilated. - Right atrium: The atrium was mildly dilated. - Tricuspid valve: There was moderate regurgitation.   LE duplex: no DVT, baker cyst or SVT.  Antibiotics:  none (indicate start date, and stop date if known)  HPI/Subjective: Still fluid overload and with SOB with  minimal exertion.  Objective: Filed Vitals:   07/11/13 0624 07/11/13 0922 07/11/13 1430 07/11/13 2104  BP: 93/45 110/57 112/55 127/58  Pulse: 72 88 68 88  Temp: 98 F (36.7 C)  97.2 F (36.2 C) 97.5 F (36.4 C)  TempSrc: Oral  Oral Oral  Resp: 20  18 18   Height:      Weight: 97.251 kg (214 lb 6.4 oz)     SpO2: 94%  97% 96%     Exam:  General: Alert, awake, oriented x3, in no acute distress.  HEENT: No bruits, no goiter. +JVD Heart: Regular rate and rhythm, 3+ edema, she is also edematous in her abdomen. Lungs: no wheezing, positive crackles at bases Abdomen: Soft, nontender, nondistended, positive bowel sounds.   Data Reviewed: Basic Metabolic Panel:  Recent Labs Lab 07/09/13 1602 07/09/13 1932 07/10/13 0217 07/11/13 0341  NA 133*  --  135* 133*  K 4.2  --  4.4 4.4  CL 98  --  100 96  CO2 19  --  23 22  GLUCOSE 92  --  93 97  BUN 13  --  13 12  CREATININE 0.75 0.76 0.90 0.75  CALCIUM 8.7  --  8.3* 8.3*   Liver Function Tests:  Recent Labs Lab 07/09/13 1602 07/10/13 0217 07/11/13 0341  AST 27 24 27   ALT 10 9 10   ALKPHOS 148* 132* 133*  BILITOT 1.2 0.9 0.9  PROT 6.7 5.9* 5.9*  ALBUMIN 3.2* 2.9* 2.9*   CBC:  Recent Labs Lab 07/09/13 1602 07/09/13 1932 07/10/13 0217 07/11/13 0341  WBC 4.8  4.8 5.2 5.1  NEUTROABS 3.1  --  3.4 3.1  HGB 8.7* 8.1* 7.9* 8.3*  HCT 28.2* 26.0* 25.0* 26.3*  MCV 89.5 88.1 88.7 89.2  PLT 149* 156 139* 153   Cardiac Enzymes:  Recent Labs Lab 07/09/13 1602 07/09/13 1932 07/10/13 0217 07/10/13 0555  TROPONINI <0.30 <0.30 <0.30 <0.30   BNP (last 3 results)  Recent Labs  07/09/13 1602  PROBNP 1673.0*    Studies: Ct Angio Chest Pe W/cm &/or Wo Cm  07/10/2013   CLINICAL DATA:  Dyspnea.  Elevated D-dimer.  EXAM: CT ANGIOGRAPHY CHEST WITH CONTRAST  TECHNIQUE: Multidetector CT imaging of the chest was performed using the standard protocol during bolus administration of intravenous contrast. Multiplanar CT image  reconstructions and MIPs were obtained to evaluate the vascular anatomy.  CONTRAST:  58mL OMNIPAQUE IOHEXOL 350 MG/ML SOLN  COMPARISON:  05/12/2008  FINDINGS: THORACIC INLET/BODY WALL:  Diffuse skin and subcutaneous edema.  MEDIASTINUM:  Cardiomegaly. No pericardial effusion. Patient is status post aortic valve replacement. There is bulky mitral annular calcification. Atherosclerosis, status post CABG. No adenopathy.  No evidence of pulmonary embolism. There is repeated respiratory motion which decreases sensitivity in evaluating the post segmental pulmonary arterial tree.  LUNG WINDOWS:  Moderate volume bilateral water density pleural effusions. There is some scalloping of the lung margins, but overall the fluid is simple appearing. There is dependent ground-glass density in the upper lungs, likely atelectasis. There is a small area of airspace disease in the lingula. Extensive lower lobe atelectasis. There is centrilobular emphysema at the apices, better seen on the previous examination.  UPPER ABDOMEN:  Lobulated liver contour and probable splenomegaly, findings which suggest cirrhosis.  OSSEOUS:  No acute fracture.  No suspicious lytic or blastic lesions.  Review of the MIP images confirms the above findings.  IMPRESSION: 1. Negative for pulmonary embolism to the proximal segmental level. 2. Moderate bilateral pleural effusions with extensive atelectasis. 3. Possible early pneumonia in the lingula. 4. Probable cirrhosis.   Electronically Signed   By: Jorje Guild M.D.   On: 07/10/2013 03:51    Scheduled Meds: . ferrous sulfate  325 mg Oral Q breakfast  . gabapentin  300 mg Oral QHS  . heparin  5,000 Units Subcutaneous 3 times per day  . [START ON 07/12/2013] lactose free nutrition  237 mL Oral BID BM  . levothyroxine  200 mcg Oral QAC breakfast  . [START ON 07/12/2013] metolazone  2.5 mg Oral Daily  . metoprolol tartrate  25 mg Oral BID  . potassium chloride SA  20 mEq Oral BID  . sodium chloride  3  mL Intravenous Q12H  . sodium chloride  3 mL Intravenous Q12H   Continuous Infusions: . furosemide (LASIX) infusion 8 mg/hr (07/10/13 1740)     Barton Dubois  [facility] Pager [phone] If 8PM-8AM, please contact night-coverage at [email or web address], password [place] 07/11/2013, 10:18 PM  LOS: 2 days     **Disclaimer: This note may have been dictated with voice recognition software. Similar sounding words can inadvertently be transcribed and this note may contain transcription errors which may not have been corrected upon publication of note.**

## 2013-07-11 NOTE — Progress Notes (Signed)
Bilateral lower extremity venous duplex:  No evidence of DVT, superficial thrombosis, or Baker's cyst.  Technically difficult study due to the patient's body habitus.   

## 2013-07-12 DIAGNOSIS — I050 Rheumatic mitral stenosis: Secondary | ICD-10-CM

## 2013-07-12 DIAGNOSIS — I5033 Acute on chronic diastolic (congestive) heart failure: Principal | ICD-10-CM

## 2013-07-12 LAB — BASIC METABOLIC PANEL
BUN: 11 mg/dL (ref 6–23)
CO2: 24 mEq/L (ref 19–32)
CREATININE: 0.81 mg/dL (ref 0.50–1.10)
Calcium: 8.4 mg/dL (ref 8.4–10.5)
Chloride: 96 mEq/L (ref 96–112)
GFR calc Af Amer: 82 mL/min — ABNORMAL LOW (ref >90)
GFR, EST NON AFRICAN AMERICAN: 70 mL/min — AB (ref >90)
Glucose, Bld: 87 mg/dL (ref 70–99)
Potassium: 4 mEq/L (ref 3.7–5.3)
Sodium: 132 mEq/L — ABNORMAL LOW (ref 137–147)

## 2013-07-12 NOTE — Plan of Care (Signed)
Problem: Phase I Progression Outcomes Goal: EF % per last Echo/documented,Core Reminder form on chart Outcome: Completed/Met Date Met:  07/12/13 EF 55% per ECHO performed on 07/10/13.

## 2013-07-12 NOTE — Care Management Note (Signed)
    Page 1 of 1   07/12/2013     11:22:14 AM CARE MANAGEMENT NOTE 07/12/2013  Patient:  [name], Nicole Hansen   Account Number:  [id number]  Date Initiated:  07/12/2013  Documentation initiated by:  [facility]  Subjective/Objective Assessment:   73 y.o. year old female presenting with acute resp failure, edema.//Home with spouse.     Action/Plan:   IV lasix. Check 2D ECHO. Cardiology consult to reeval overall case. Cycle CEs. Risk stratification labs. Telemetry bed. Supplemental O2 prn. //Resume HH services.   Anticipated DC Date:  07/14/2013   Anticipated DC Plan:  [place]  CM consult      [facility] Choice  HOME HEALTH  Resumption Of Svcs/PTA Provider   Choice offered to / List presented to:          [facility] arranged  HH-1 RN  [facility]      [place].   Status of service:  Completed, signed off Medicare Important Message given?  YES (If response is "NO", the following Medicare IM given date fields will be blank) Date Medicare IM given:  07/09/2013 Date Additional Medicare IM given:  07/12/2013  Discharge Disposition:    Per UR Regulation:  Reviewed for med. necessity/level of care/duration of stay  If discussed at [place] of Stay Meetings, dates discussed:    Comments:  07/12/12 1030 Finbar Nippert J. Clydene Laming, [place], [place], [employer] [phone]. Pt currently active with [place] for RN/PT/OT services.  Resumption of care requested.  Janae Sauce, RN of [facility] notified of admission.  No DME needs identified at this time.

## 2013-07-12 NOTE — Progress Notes (Signed)
[facility] PROGRESS NOTE  Filed Weights   07/10/13 0505 07/11/13 0624 07/12/13 0550  Weight: 96.888 kg (213 lb 9.6 oz) 97.251 kg (214 lb 6.4 oz) 96.752 kg (213 lb 4.8 oz)        Intake/Output Summary (Last 24 hours) at 07/12/13 1417 Last data filed at 07/12/13 1338  Gross per 24 hour  Intake    720 ml  Output   1950 ml  Net  -1230 ml     Assessment/Plan:  Acute respiratory failure due to acute on chronic diastolic HF: -Continue lasix drip and metolazone -continue daily weight, fluid restriction, low sodium diet. -monitor electrolytes, replete as needed. -patient fluid status is better but still with extra fluid on board. -cardiac markers negative x 3 and patient denies CP -continue TED hose -good diuresis overnight   Essential HTN: -cont home antihypertensive meds. -BP stable  Anemia: -no signs of overt bleeding -continue ferrous sulfate -Hgb stable  Neuropathy: -continue gabapentin  Hypothyroidism: -TSH 7.3 -synthroid adjusted to 214mcg during this admission -will need repeat thyroid function test in 4 weeks  Mitral stenosis: -patient will like to follow and discussed with cardiology future treatments -currently plan is for medication management   Code Status: full Family Communication: none  Disposition Plan: remains inpatient   Consultants:  none  Procedures:  ECHO:  - Left ventricle: The cavity size was normal. Wall thickness was normal. The estimated ejection fraction was 55%. - Aortic valve: There was mild stenosis. Valve area (Vmax): 0.88 cm^2. - Mitral valve: There is severe mitral annular calcificatin causing moderate stenosis across the mitral oriface There was mild regurgitation. Valve area by continuity equation (using LVOT flow): 0.92 cm^2. - Left atrium: The atrium was moderately dilated. - Right ventricle: The cavity size was mildly dilated. - Right atrium: The atrium was mildly dilated. - Tricuspid valve: There was  moderate regurgitation.   LE duplex: no DVT, baker cyst or SVT.  Antibiotics:  none (indicate start date, and stop date if known)  HPI/Subjective: Still fluid overload. Reports she is breathing better and denies CP  Objective: Filed Vitals:   07/11/13 1430 07/11/13 2104 07/12/13 0550 07/12/13 0900  BP: 112/55 127/58 103/43 100/54  Pulse: 68 88 78 83  Temp: 97.2 F (36.2 C) 97.5 F (36.4 C) 98.2 F (36.8 C)   TempSrc: Oral Oral Oral   Resp: 18 18 20    Height:      Weight:   96.752 kg (213 lb 4.8 oz)   SpO2: 97% 96% 94%      Exam:  General: Alert, awake, oriented x3, in no acute distress.  HEENT: No bruits, no goiter. Mild JVD Heart: Regular rate and rhythm, 2-3+ edema, she is also edematous in her abdomen and lower back. Lungs: no wheezing, no frank crackles auscultated  Abdomen: Soft, nontender, nondistended, positive bowel sounds.   Data Reviewed: Basic Metabolic Panel:  Recent Labs Lab 07/09/13 1602 07/09/13 1932 07/10/13 0217 07/11/13 0341 07/12/13 0357  NA 133*  --  135* 133* 132*  K 4.2  --  4.4 4.4 4.0  CL 98  --  100 96 96  CO2 19  --  23 22 24   GLUCOSE 92  --  93 97 87  BUN 13  --  13 12 11   CREATININE 0.75 0.76 0.90 0.75 0.81  CALCIUM 8.7  --  8.3* 8.3* 8.4   Liver Function Tests:  Recent Labs Lab 07/09/13 1602 07/10/13 0217 07/11/13 0341  AST 27 24 27  ALT 10 9 10   ALKPHOS 148* 132* 133*  BILITOT 1.2 0.9 0.9  PROT 6.7 5.9* 5.9*  ALBUMIN 3.2* 2.9* 2.9*   CBC:  Recent Labs Lab 07/09/13 1602 07/09/13 1932 07/10/13 0217 07/11/13 0341  WBC 4.8 4.8 5.2 5.1  NEUTROABS 3.1  --  3.4 3.1  HGB 8.7* 8.1* 7.9* 8.3*  HCT 28.2* 26.0* 25.0* 26.3*  MCV 89.5 88.1 88.7 89.2  PLT 149* 156 139* 153   Cardiac Enzymes:  Recent Labs Lab 07/09/13 1602 07/09/13 1932 07/10/13 0217 07/10/13 0555  TROPONINI <0.30 <0.30 <0.30 <0.30   BNP (last 3 results)  Recent Labs  07/09/13 1602  PROBNP 1673.0*    Studies: No results  found.  Scheduled Meds: . ferrous sulfate  325 mg Oral Q breakfast  . gabapentin  300 mg Oral QHS  . heparin  5,000 Units Subcutaneous 3 times per day  . lactose free nutrition  237 mL Oral BID BM  . levothyroxine  200 mcg Oral QAC breakfast  . metolazone  2.5 mg Oral Daily  . metoprolol tartrate  25 mg Oral BID  . potassium chloride SA  20 mEq Oral BID  . sodium chloride  3 mL Intravenous Q12H  . sodium chloride  3 mL Intravenous Q12H   Continuous Infusions: . furosemide (LASIX) infusion 8 mg/hr (07/11/13 2240)   Time > 30 minutes  Barton Dubois  [facility] Pager [phone] If 8PM-8AM, please contact night-coverage at [email or web address], password [facility] 07/12/2013, 2:17 PM  LOS: 3 days     **Disclaimer: This note may have been dictated with voice recognition software. Similar sounding words can inadvertently be transcribed and this note may contain transcription errors which may not have been corrected upon publication of note.**

## 2013-07-13 LAB — BASIC METABOLIC PANEL
BUN: 14 mg/dL (ref 6–23)
CALCIUM: 8.3 mg/dL — AB (ref 8.4–10.5)
CO2: 25 meq/L (ref 19–32)
CREATININE: 1.14 mg/dL — AB (ref 0.50–1.10)
Chloride: 93 mEq/L — ABNORMAL LOW (ref 96–112)
GFR calc non Af Amer: 47 mL/min — ABNORMAL LOW (ref >90)
GFR, EST AFRICAN AMERICAN: 54 mL/min — AB (ref >90)
Glucose, Bld: 83 mg/dL (ref 70–99)
Potassium: 3.4 mEq/L — ABNORMAL LOW (ref 3.7–5.3)
Sodium: 132 mEq/L — ABNORMAL LOW (ref 137–147)

## 2013-07-13 MED ORDER — FUROSEMIDE 10 MG/ML IJ SOLN
60.0000 mg | Freq: Two times a day (BID) | INTRAMUSCULAR | Status: DC
  Administered 2013-07-13 – 2013-07-14 (×2): 60 mg via INTRAVENOUS
  Filled 2013-07-13 (×3): qty 6

## 2013-07-13 MED ORDER — METOPROLOL TARTRATE 12.5 MG HALF TABLET
12.5000 mg | ORAL_TABLET | Freq: Two times a day (BID) | ORAL | Status: DC
  Administered 2013-07-13 – 2013-07-17 (×8): 12.5 mg via ORAL
  Filled 2013-07-13 (×9): qty 1

## 2013-07-13 NOTE — Progress Notes (Signed)
[facility] PROGRESS NOTE  Filed Weights   07/11/13 0624 07/12/13 0550 07/13/13 0500  Weight: 97.251 kg (214 lb 6.4 oz) 96.752 kg (213 lb 4.8 oz) 96.1 kg (211 lb 13.8 oz)        Intake/Output Summary (Last 24 hours) at 07/13/13 1337 Last data filed at 07/13/13 1314  Gross per 24 hour  Intake    960 ml  Output   2050 ml  Net  -1090 ml     Assessment/Plan:  Acute respiratory failure due to acute on chronic diastolic HF: -BP now soft and patient has had good diuresis; will switch lasix to IV (individual dosages, to help quantifying oral regimen) and discontinue drip; will continue metolazone -continue daily weight, fluid restriction, low sodium diet. -monitor electrolytes, replete as needed. -patient fluid status is better but still with extra fluid on board. -cardiac markers negative x 3 and patient denies CP -continue TED hose -good diuresis overnight   Essential HTN: -cont home antihypertensive meds. -BP stable  Anemia: -no signs of overt bleeding -continue ferrous sulfate -Hgb stable  Neuropathy: -continue gabapentin  Hypothyroidism: -TSH 7.3 -synthroid adjusted to 212mcg during this admission -will need repeat thyroid function test in 4 weeks  Mitral stenosis: -patient will like to follow and discussed with cardiology future treatments -currently plan is for medication management   Code Status: full Family Communication: none  Disposition Plan: remains inpatient   Consultants:  none  Procedures:  ECHO:  - Left ventricle: The cavity size was normal. Wall thickness was normal. The estimated ejection fraction was 55%. - Aortic valve: There was mild stenosis. Valve area (Vmax): 0.88 cm^2. - Mitral valve: There is severe mitral annular calcificatin causing moderate stenosis across the mitral oriface There was mild regurgitation. Valve area by continuity equation (using LVOT flow): 0.92 cm^2. - Left atrium: The atrium was moderately  dilated. - Right ventricle: The cavity size was mildly dilated. - Right atrium: The atrium was mildly dilated. - Tricuspid valve: There was moderate regurgitation.   LE duplex: no DVT, baker cyst or SVT.  Antibiotics:  none (indicate start date, and stop date if known)  HPI/Subjective: No fever, no CP; breathing a lot better and with improvement in her fluid overload.  Objective: Filed Vitals:   07/12/13 1500 07/12/13 2100 07/13/13 0500 07/13/13 0953  BP: 102/53 112/56 92/48 92/50   Pulse: 81 89 79   Temp: 98.5 F (36.9 C) 98 F (36.7 C) 99 F (37.2 C)   TempSrc: Oral Oral Oral   Resp: 18 20 20    Height:      Weight:   96.1 kg (211 lb 13.8 oz)   SpO2: 91% 90% 94%      Exam:  General: slightly somnolent today; but easy to arouse (verbally) and well oriented, in no acute distress.  HEENT: No bruits, no goiter. Mild JVD Heart: Regular rate and rhythm, 2++ edema, she is also edematous in her abdomen, thighs and lower back. Lungs: no wheezing, no frank crackles auscultated; good air movement  Abdomen: Soft, nontender, nondistended, positive bowel sounds.   Data Reviewed: Basic Metabolic Panel:  Recent Labs Lab 07/09/13 1602 07/09/13 1932 07/10/13 0217 07/11/13 0341 07/12/13 0357 07/13/13 0649  NA 133*  --  135* 133* 132* 132*  K 4.2  --  4.4 4.4 4.0 3.4*  CL 98  --  100 96 96 93*  CO2 19  --  23 22 24 25   GLUCOSE 92  --  93 97 87 83  BUN 13  --  13 12 11 14   CREATININE 0.75 0.76 0.90 0.75 0.81 1.14*  CALCIUM 8.7  --  8.3* 8.3* 8.4 8.3*   Liver Function Tests:  Recent Labs Lab 07/09/13 1602 07/10/13 0217 07/11/13 0341  AST 27 24 27   ALT 10 9 10   ALKPHOS 148* 132* 133*  BILITOT 1.2 0.9 0.9  PROT 6.7 5.9* 5.9*  ALBUMIN 3.2* 2.9* 2.9*   CBC:  Recent Labs Lab 07/09/13 1602 07/09/13 1932 07/10/13 0217 07/11/13 0341  WBC 4.8 4.8 5.2 5.1  NEUTROABS 3.1  --  3.4 3.1  HGB 8.7* 8.1* 7.9* 8.3*  HCT 28.2* 26.0* 25.0* 26.3*  MCV 89.5 88.1 88.7 89.2   PLT 149* 156 139* 153   Cardiac Enzymes:  Recent Labs Lab 07/09/13 1602 07/09/13 1932 07/10/13 0217 07/10/13 0555  TROPONINI <0.30 <0.30 <0.30 <0.30   BNP (last 3 results)  Recent Labs  07/09/13 1602  PROBNP 1673.0*    Studies: No results found.  Scheduled Meds: . ferrous sulfate  325 mg Oral Q breakfast  . furosemide  60 mg Intravenous Q12H  . gabapentin  300 mg Oral QHS  . heparin  5,000 Units Subcutaneous 3 times per day  . lactose free nutrition  237 mL Oral BID BM  . levothyroxine  200 mcg Oral QAC breakfast  . metolazone  2.5 mg Oral Daily  . metoprolol tartrate  12.5 mg Oral BID  . potassium chloride SA  20 mEq Oral BID  . sodium chloride  3 mL Intravenous Q12H  . sodium chloride  3 mL Intravenous Q12H   Continuous Infusions:   Time > 30 minutes  Barton Dubois  [facility] Pager [phone] If 8PM-8AM, please contact night-coverage at [email or web address], password [facility] 07/13/2013, 1:37 PM  LOS: 4 days     **Disclaimer: This note may have been dictated with voice recognition software. Similar sounding words can inadvertently be transcribed and this note may contain transcription errors which may not have been corrected upon publication of note.**

## 2013-07-13 NOTE — Progress Notes (Signed)
Utilization Review Completed Kharter Brew J. Darilyn Storbeck, RN, BSN, NCM [phone]  

## 2013-07-14 LAB — BASIC METABOLIC PANEL
BUN: 17 mg/dL (ref 6–23)
CO2: 27 mEq/L (ref 19–32)
Calcium: 8.7 mg/dL (ref 8.4–10.5)
Chloride: 91 mEq/L — ABNORMAL LOW (ref 96–112)
Creatinine, Ser: 1.07 mg/dL (ref 0.50–1.10)
GFR calc Af Amer: 58 mL/min — ABNORMAL LOW (ref >90)
GFR, EST NON AFRICAN AMERICAN: 50 mL/min — AB (ref >90)
GLUCOSE: 88 mg/dL (ref 70–99)
Potassium: 3.5 mEq/L — ABNORMAL LOW (ref 3.7–5.3)
SODIUM: 131 meq/L — AB (ref 137–147)

## 2013-07-14 MED ORDER — POTASSIUM CHLORIDE CRYS ER 20 MEQ PO TBCR
40.0000 meq | EXTENDED_RELEASE_TABLET | Freq: Two times a day (BID) | ORAL | Status: AC
  Administered 2013-07-14 (×2): 40 meq via ORAL
  Filled 2013-07-14: qty 2

## 2013-07-14 MED ORDER — FUROSEMIDE 10 MG/ML IJ SOLN
60.0000 mg | Freq: Three times a day (TID) | INTRAMUSCULAR | Status: DC
  Administered 2013-07-14 – 2013-07-16 (×8): 60 mg via INTRAVENOUS
  Filled 2013-07-14 (×6): qty 6

## 2013-07-14 MED ORDER — FUROSEMIDE 40 MG PO TABS: 40.0000 mg | ORAL_TABLET | Freq: Two times a day (BID) | ORAL | Status: DC

## 2013-07-14 MED ORDER — POLYETHYLENE GLYCOL 3350 17 G PO PACK
17.0000 g | PACK | Freq: Every day | ORAL | Status: DC
  Administered 2013-07-14 – 2013-07-16 (×3): 17 g via ORAL
  Filled 2013-07-14 (×3): qty 1

## 2013-07-14 NOTE — Progress Notes (Signed)
[facility] PROGRESS NOTE  Filed Weights   07/12/13 0550 07/13/13 0500 07/14/13 0541  Weight: 96.752 kg (213 lb 4.8 oz) 96.1 kg (211 lb 13.8 oz) 95.8 kg (211 lb 3.2 oz)        Intake/Output Summary (Last 24 hours) at 07/14/13 0950 Last data filed at 07/14/13 0900  Gross per 24 hour  Intake    840 ml  Output   1025 ml  Net   -185 ml     Assessment/Plan: Acute respiratory failure due to acute on chronic diastolic HF:  -BP now soft and patient has had good diuresis on IV drip. - Now on lasix push and metolazone. Still with JVD and woody lower ext edema. -continue daily weight, fluid restriction, low sodium diet.  -monitor electrolytes, replete as needed.  -patient fluid status is better but still with extra fluid on board.  -cardiac markers negative x 3 and patient denies CP  -continue TED hose   Essential HTN:  - BP has significantly improve with diuresis. -BP stable cont metoprolol.  Anemia:  -no signs of overt bleeding  -continue ferrous sulfate  -Hgb stable.  Neuropathy:  -continue gabapentin   Hypothyroidism:  -TSH 7.3  -synthroid adjusted to 249mcg during this admission  -will need repeat thyroid function test in 4 weeks   Mitral stenosis:  -patient will like to follow and discussed with cardiology future treatments  -currently plan is for medication management   Code Status: full  Family Communication: none  Disposition Plan: remains inpatient    Consultants:  none  Procedures: ECHO: The cavity size was normal. Wall thickness was normal. The estimated ejection fraction was 55%. - Aortic valve: There was mild stenosis. Valve area (Vmax): 0.88 cm^2.  Mitral valve: There is severe mitral annular calcificatin causing moderate stenosis across the mitral oriface There was mild regurgitation. Valve area by continuity equation (using LVOT flow): 0.92 cm^2.   Antibiotics:  None  HPI/Subjective: Relates abdominal discomfort  better  Objective: Filed Vitals:   07/13/13 0953 07/13/13 1417 07/13/13 2245 07/14/13 0541  BP: 92/50 101/43 122/60 90/48  Pulse:  81 94 73  Temp:  97.6 F (36.4 C) 98.1 F (36.7 C) 97.7 F (36.5 C)  TempSrc:  Oral Oral Oral  Resp:  20 20 18   Height:      Weight:    95.8 kg (211 lb 3.2 oz)  SpO2:  98% 96% 92%     Exam:  General: Alert, awake, oriented x3, in no acute distress.  HEENT: No bruits, no goiter. +JVD Heart: Regular rate and rhythm. Lungs: Good air movement, clear Abdomen: Soft, nontender, nondistended, positive bowel sounds.     Data Reviewed: Basic Metabolic Panel:  Recent Labs Lab 07/10/13 0217 07/11/13 0341 07/12/13 0357 07/13/13 0649 07/14/13 0500  NA 135* 133* 132* 132* 131*  K 4.4 4.4 4.0 3.4* 3.5*  CL 100 96 96 93* 91*  CO2 23 22 24 25 27   GLUCOSE 93 97 87 83 88  BUN 13 12 11 14 17   CREATININE 0.90 0.75 0.81 1.14* 1.07  CALCIUM 8.3* 8.3* 8.4 8.3* 8.7   Liver Function Tests:  Recent Labs Lab 07/09/13 1602 07/10/13 0217 07/11/13 0341  AST 27 24 27   ALT 10 9 10   ALKPHOS 148* 132* 133*  BILITOT 1.2 0.9 0.9  PROT 6.7 5.9* 5.9*  ALBUMIN 3.2* 2.9* 2.9*   No results found for this basename: LIPASE, AMYLASE,  in the last 168 hours No results found for this  basename: AMMONIA,  in the last 168 hours CBC:  Recent Labs Lab 07/09/13 1602 07/09/13 1932 07/10/13 0217 07/11/13 0341  WBC 4.8 4.8 5.2 5.1  NEUTROABS 3.1  --  3.4 3.1  HGB 8.7* 8.1* 7.9* 8.3*  HCT 28.2* 26.0* 25.0* 26.3*  MCV 89.5 88.1 88.7 89.2  PLT 149* 156 139* 153   Cardiac Enzymes:  Recent Labs Lab 07/09/13 1602 07/09/13 1932 07/10/13 0217 07/10/13 0555  TROPONINI <0.30 <0.30 <0.30 <0.30   BNP (last 3 results)  Recent Labs  07/09/13 1602  PROBNP 1673.0*   CBG: No results found for this basename: GLUCAP,  in the last 168 hours  No results found for this or any previous visit (from the past 240 hour(s)).   Studies: No results found.  Scheduled  Meds: . ferrous sulfate  325 mg Oral Q breakfast  . furosemide  60 mg Intravenous Q12H  . gabapentin  300 mg Oral QHS  . heparin  5,000 Units Subcutaneous 3 times per day  . lactose free nutrition  237 mL Oral BID BM  . levothyroxine  200 mcg Oral QAC breakfast  . metolazone  2.5 mg Oral Daily  . metoprolol tartrate  12.5 mg Oral BID  . polyethylene glycol  17 g Oral Daily  . potassium chloride SA  20 mEq Oral BID  . sodium chloride  3 mL Intravenous Q12H  . sodium chloride  3 mL Intravenous Q12H   Continuous Infusions:    Charlynne Cousins  [facility] Pager [phone] If 8PM-8AM, please contact night-coverage at [email or web address], password [facility] 07/14/2013, 9:50 AM  LOS: 5 days     **Disclaimer: This note may have been dictated with voice recognition software. Similar sounding words can inadvertently be transcribed and this note may contain transcription errors which may not have been corrected upon publication of note.**

## 2013-07-15 LAB — BASIC METABOLIC PANEL
BUN: 19 mg/dL (ref 6–23)
CO2: 27 mEq/L (ref 19–32)
Calcium: 8.7 mg/dL (ref 8.4–10.5)
Chloride: 92 mEq/L — ABNORMAL LOW (ref 96–112)
Creatinine, Ser: 0.91 mg/dL (ref 0.50–1.10)
GFR, EST AFRICAN AMERICAN: 71 mL/min — AB (ref >90)
GFR, EST NON AFRICAN AMERICAN: 61 mL/min — AB (ref >90)
Glucose, Bld: 91 mg/dL (ref 70–99)
POTASSIUM: 3.7 meq/L (ref 3.7–5.3)
Sodium: 133 mEq/L — ABNORMAL LOW (ref 137–147)

## 2013-07-15 NOTE — Progress Notes (Signed)
[facility] PROGRESS NOTE  Filed Weights   07/13/13 0500 07/14/13 0541 07/15/13 0517  Weight: 96.1 kg (211 lb 13.8 oz) 95.8 kg (211 lb 3.2 oz) 95 kg (209 lb 7 oz)        Intake/Output Summary (Last 24 hours) at 07/15/13 1635 Last data filed at 07/15/13 1359  Gross per 24 hour  Intake    240 ml  Output   1200 ml  Net   -960 ml     Assessment/Plan:  Acute respiratory failure due to acute on chronic diastolic HF: -BP now soft and patient has had good diuresis; will continue lasix to IV (individual dosages, to help quantifying oral regimen), will continue metolazone -continue daily weight, fluid restriction, low sodium diet. -monitor electrolytes, replete as needed. -patient fluid status is better but still with extra fluid on board. -cardiac markers negative x 3 and patient denies CP -continue TED hose -patient continue to have good diuresis (1.2 L overnight)   Essential HTN: -cont home antihypertensive meds. -BP stable  Anemia: -no signs of overt bleeding -continue ferrous sulfate -Hgb stable  Neuropathy: -continue gabapentin  Hypothyroidism: -TSH 7.3 -synthroid adjusted to 274mcg during this admission -will need repeat thyroid function test in 4 weeks  Mitral stenosis: -patient will like to follow and discussed with cardiology future treatments -currently plan is for medication management   Code Status: full Family Communication: none  Disposition Plan: remains inpatient   Consultants:  none  Procedures:  ECHO:  - Left ventricle: The cavity size was normal. Wall thickness was normal. The estimated ejection fraction was 55%. - Aortic valve: There was mild stenosis. Valve area (Vmax): 0.88 cm^2. - Mitral valve: There is severe mitral annular calcificatin causing moderate stenosis across the mitral oriface There was mild regurgitation. Valve area by continuity equation (using LVOT flow): 0.92 cm^2. - Left atrium: The atrium was moderately  dilated. - Right ventricle: The cavity size was mildly dilated. - Right atrium: The atrium was mildly dilated. - Tricuspid valve: There was moderate regurgitation.   LE duplex: no DVT, baker cyst or SVT.  Antibiotics:  none (indicate start date, and stop date if known)  HPI/Subjective:. Afebrile, feeling better. Still fluid overload  Objective: Filed Vitals:   07/14/13 1430 07/14/13 2000 07/15/13 0517 07/15/13 1330  BP: 113/45 112/59 98/49 93/48   Pulse: 69 86 80 75  Temp: 97 F (36.1 C) 97.7 F (36.5 C) 98.8 F (37.1 C) 97.4 F (36.3 C)  TempSrc: Oral Oral Oral Oral  Resp: 18 18 18 20   Height:      Weight:   95 kg (209 lb 7 oz)   SpO2: 96% 98% 98% 90%     Exam:  General: slightly somnolent today; but easy to arouse (verbally) and well oriented, in no acute distress.  HEENT: No bruits, no goiter. Mild JVD Heart: Regular rate and rhythm, 2++ edema, she is also edematous in her abdomen, thighs and lower back. Lungs: no wheezing, no frank crackles auscultated; good air movement  Abdomen: Soft, nontender, nondistended, positive bowel sounds.   Data Reviewed: Basic Metabolic Panel:  Recent Labs Lab 07/11/13 0341 07/12/13 0357 07/13/13 0649 07/14/13 0500 07/15/13 0419  NA 133* 132* 132* 131* 133*  K 4.4 4.0 3.4* 3.5* 3.7  CL 96 96 93* 91* 92*  CO2 22 24 25 27 27   GLUCOSE 97 87 83 88 91  BUN 12 11 14 17 19   CREATININE 0.75 0.81 1.14* 1.07 0.91  CALCIUM 8.3* 8.4 8.3* 8.7  8.7   Liver Function Tests:  Recent Labs Lab 07/09/13 1602 07/10/13 0217 07/11/13 0341  AST 27 24 27   ALT 10 9 10   ALKPHOS 148* 132* 133*  BILITOT 1.2 0.9 0.9  PROT 6.7 5.9* 5.9*  ALBUMIN 3.2* 2.9* 2.9*   CBC:  Recent Labs Lab 07/09/13 1602 07/09/13 1932 07/10/13 0217 07/11/13 0341  WBC 4.8 4.8 5.2 5.1  NEUTROABS 3.1  --  3.4 3.1  HGB 8.7* 8.1* 7.9* 8.3*  HCT 28.2* 26.0* 25.0* 26.3*  MCV 89.5 88.1 88.7 89.2  PLT 149* 156 139* 153   Cardiac Enzymes:  Recent Labs Lab  07/09/13 1602 07/09/13 1932 07/10/13 0217 07/10/13 0555  TROPONINI <0.30 <0.30 <0.30 <0.30   BNP (last 3 results)  Recent Labs  07/09/13 1602  PROBNP 1673.0*    Studies: No results found.  Scheduled Meds: . ferrous sulfate  325 mg Oral Q breakfast  . furosemide  60 mg Intravenous Q8H  . gabapentin  300 mg Oral QHS  . heparin  5,000 Units Subcutaneous 3 times per day  . lactose free nutrition  237 mL Oral BID BM  . levothyroxine  200 mcg Oral QAC breakfast  . metolazone  2.5 mg Oral Daily  . metoprolol tartrate  12.5 mg Oral BID  . polyethylene glycol  17 g Oral Daily  . sodium chloride  3 mL Intravenous Q12H  . sodium chloride  3 mL Intravenous Q12H   Continuous Infusions:   Time > 30 minutes  Barton Dubois  [facility] Pager [phone] If 8PM-8AM, please contact night-coverage at [email or web address], password [facility] 07/15/2013, 4:35 PM  LOS: 6 days     **Disclaimer: This note may have been dictated with voice recognition software. Similar sounding words can inadvertently be transcribed and this note may contain transcription errors which may not have been corrected upon publication of note.**

## 2013-07-16 LAB — BASIC METABOLIC PANEL
BUN: 18 mg/dL (ref 6–23)
CALCIUM: 8.7 mg/dL (ref 8.4–10.5)
CHLORIDE: 90 meq/L — AB (ref 96–112)
CO2: 28 mEq/L (ref 19–32)
Creatinine, Ser: 0.85 mg/dL (ref 0.50–1.10)
GFR calc Af Amer: 77 mL/min — ABNORMAL LOW (ref >90)
GFR calc non Af Amer: 66 mL/min — ABNORMAL LOW (ref >90)
Glucose, Bld: 83 mg/dL (ref 70–99)
Potassium: 3.2 mEq/L — ABNORMAL LOW (ref 3.7–5.3)
Sodium: 133 mEq/L — ABNORMAL LOW (ref 137–147)

## 2013-07-16 MED ORDER — POTASSIUM CHLORIDE CRYS ER 20 MEQ PO TBCR
40.0000 meq | EXTENDED_RELEASE_TABLET | Freq: Two times a day (BID) | ORAL | Status: DC
  Administered 2013-07-16: 40 meq via ORAL
  Filled 2013-07-16 (×3): qty 2

## 2013-07-16 MED ORDER — METOLAZONE 5 MG PO TABS
5.0000 mg | ORAL_TABLET | Freq: Every day | ORAL | Status: DC
  Administered 2013-07-17 – 2013-07-26 (×10): 5 mg via ORAL
  Filled 2013-07-16 (×10): qty 1

## 2013-07-16 MED ORDER — FUROSEMIDE 10 MG/ML IJ SOLN
80.0000 mg | Freq: Three times a day (TID) | INTRAMUSCULAR | Status: DC
  Administered 2013-07-17 – 2013-07-18 (×3): 80 mg via INTRAVENOUS
  Filled 2013-07-16 (×6): qty 8

## 2013-07-16 MED ORDER — POLYETHYLENE GLYCOL 3350 17 G PO PACK
17.0000 g | PACK | Freq: Two times a day (BID) | ORAL | Status: DC
  Administered 2013-07-16 – 2013-07-25 (×7): 17 g via ORAL
  Filled 2013-07-16 (×21): qty 1

## 2013-07-16 NOTE — Consult Note (Signed)
Advanced Heart Failure Team Consult Note  Referring Physician: Dr. Dyann Kief Primary Physician: Rubie Maid, MD  Primary Cardiologist: Dr. Wyline Copas  Reason for Consultation: Diastolic HF and volume overload  HPI:    Ms Kristensen is a 73 yo female with a history of AI s/p AVR (bovine), hypothyroidism s/p thyroidectomy, HTN, obesity and diastolic HF.   She has been admitted multiple times to [facility] for volume overload. During her stay at [facility] she would be diuresed and then was transitioned to PO lasix 20 mg for home. Daughter and patient report she can't take more than 20 mg daily of lasix because she urinates on the floor because she can't get to the bathroom in time. Of note she was transfused with 4 PRBC at [facility] within the past month and upper and lower endo was negative for bleeding.   Pertinent labs on admission Hemoglobin 8.7 creatinine 0.75, Pro BNP 1673 and Trop WNL. Started on lasix gtt with metolazone and now on IV lasix 60 mg q8hrs. Weight down 7 lbs since admission. Creatinine stablel 0.85.  ECHO 07/10/13: EF 55%, mild AI and valve area 0.88, moderate MS (mean = 8)with severe mitral annular calcification, LA mod dilated and RV mildly dilated, moderate TR  Denies SOB, orthopnea or CP.   FH: Mother deceased: HTN, AAA        Father deceased: HTN, CHF  SH: Lives with husband in [place], No ETOH or tobacco abuse.  Review of Systems: [y] = yes, [ ]  = no   General: Weight gain [Y ]; Weight loss [ ] ; Anorexia [ ] ; Fatigue [ ] ; Fever [ ] ; Chills [ ] ; Weakness [ ]   Cardiac: Chest pain/pressure [ ] ; Resting SOB [ ] ; Exertional SOB [Y ]; Orthopnea [ ] ; Pedal Edema [Y ]; Palpitations [ ] ; Syncope [ ] ; Presyncope [ ] ; Paroxysmal nocturnal dyspnea[ ]   Pulmonary: Cough [ ] ; Wheezing[ ] ; Hemoptysis[ ] ; Sputum [ ] ; Snoring [ ]   GI: Vomiting[ ] ; Dysphagia[ ] ; Melena[ ] ; Hematochezia [ ] ; Heartburn[ ] ; Abdominal pain [ ] ; Constipation [ ] ; Diarrhea [ ] ; BRBPR [ ]   GU: Hematuria[ ] ;  Dysuria [ ] ; Nocturia[ ]   Vascular: Pain in legs with walking [ ] ; Pain in feet with lying flat [ ] ; Non-healing sores [ ] ; Stroke [ ] ; TIA [ ] ; Slurred speech [ ] ;  Neuro: Headaches[ ] ; Vertigo[ ] ; Seizures[ ] ; Paresthesias[ ] ;Blurred vision [ ] ; Diplopia [ ] ; Vision changes [ ]   Ortho/Skin: Arthritis [ ] ; Joint pain [ Y]; Muscle pain [ ] ; Joint swelling [ ] ; Back Pain [ ] ; Rash [ ]   Psych: Depression[ ] ; Anxiety[ ]   Heme: Bleeding problems [ Y]; Clotting disorders [ ] ; Anemia [ ]   Endocrine: Diabetes [ ] ; Thyroid dysfunction[ ]   Home Medications Prior to Admission medications   Medication Sig Start Date End Date Taking? Authorizing Provider  acetaminophen (TYLENOL) 325 MG tablet Take 650 mg by mouth every 6 (six) hours as needed (pain).    Yes Historical Provider, MD  ferrous sulfate 325 (65 FE) MG tablet Take 325 mg by mouth 3 (three) times daily with meals.    Yes Historical Provider, MD  furosemide (LASIX) 20 MG tablet Take 20 mg by mouth daily.    Yes Historical Provider, MD  gabapentin (NEURONTIN) 300 MG capsule Take 300 mg by mouth at bedtime. 05/31/13  Yes Historical Provider, MD  levothyroxine (SYNTHROID, LEVOTHROID) 175 MCG tablet Take 175 mcg by mouth daily before breakfast.   Yes Historical  Provider, MD  LORazepam (ATIVAN) 1 MG tablet Take 1 mg by mouth at bedtime as needed for anxiety or sleep. sleep 06/01/13  Yes Historical Provider, MD  losartan (COZAAR) 25 MG tablet Take 25 mg by mouth daily. 06/17/13  Yes Historical Provider, MD  MENTHOL, TOPICAL ANALGESIC, EX Apply 1 application topically daily as needed (pain).   Yes Historical Provider, MD  metoprolol tartrate (LOPRESSOR) 25 MG tablet Take 25 mg by mouth at bedtime.  05/04/13  Yes Historical Provider, MD  nitrofurantoin, macrocrystal-monohydrate, (MACROBID) 100 MG capsule Take 100 mg by mouth 2 (two) times daily. 7 day supply started on 5/30   Yes Historical Provider, MD  potassium chloride SA (K-DUR,KLOR-CON) 20 MEQ tablet Take  20 mEq by mouth 2 (two) times daily. 07/02/13  Yes Historical Provider, MD    Past Medical History: History reviewed. No pertinent past medical history.  Past Surgical History: History reviewed. No pertinent past surgical history.  Family History: History reviewed. No pertinent family history.  Social History: History   Social History  . Marital Status: Married    Spouse Name: N/A    Number of Children: N/A  . Years of Education: N/A   Social History Main Topics  . Smoking status: Former Research scientist (life sciences)  . Smokeless tobacco: Former Systems developer    Quit date: 07/10/1984  . Alcohol Use: No  . Drug Use: No  . Sexual Activity: None   Other Topics Concern  . None   Social History Narrative  . None    Allergies:  Allergies  Allergen Reactions  . Penicillins Rash    Objective:    Vital Signs:   Temp:  [97.4 F (36.3 C)-97.8 F (36.6 C)] 97.8 F (36.6 C) (06/08 0814) Pulse Rate:  [75-84] 80 (06/08 1029) Resp:  [18-20] 18 (06/08 0633) BP: (93-110)/(42-72) 106/47 mmHg (06/08 1029) SpO2:  [90 %-98 %] 98 % (06/08 4818) Weight:  [208 lb 11.7 oz (94.68 kg)] 208 lb 11.7 oz (94.68 kg) (06/08 0633) Last BM Date: 07/13/13  Weight change: Filed Weights   07/14/13 0541 07/15/13 0517 07/16/13 5631  Weight: 211 lb 3.2 oz (95.8 kg) 209 lb 7 oz (95 kg) 208 lb 11.7 oz (94.68 kg)    Intake/Output:   Intake/Output Summary (Last 24 hours) at 07/16/13 1237 Last data filed at 07/16/13 1029  Gross per 24 hour  Intake   1209 ml  Output   1975 ml  Net   -766 ml     Physical Exam: General:  Well appearing. No resp difficulty, sitting on side of bed HEENT: normal Neck: supple. JVP to jaw. Carotids 2+ bilat; no bruits. No lymphadenopathy or thryomegaly appreciated. Cor: PMI nondisplaced. Regular rate & rhythm. No rubs or gallops, 2/6 AS and MR Lungs: clear Abdomen: tight, nontender, ++distended. No hepatosplenomegaly. No bruits or masses. Good bowel sounds. Extremities: no cyanosis,  clubbing, rash, 3+ thick woody edema Neuro: alert & orientedx3, cranial nerves grossly intact. moves all 4 extremities w/o difficulty. Affect pleasant  Telemetry: SR   Labs: Basic Metabolic Panel:  Recent Labs Lab 07/12/13 0357 07/13/13 0649 07/14/13 0500 07/15/13 0419 07/16/13 0359  NA 132* 132* 131* 133* 133*  K 4.0 3.4* 3.5* 3.7 3.2*  CL 96 93* 91* 92* 90*  CO2 24 25 27 27 28   GLUCOSE 87 83 88 91 83  BUN 11 14 17 19 18   CREATININE 0.81 1.14* 1.07 0.91 0.85  CALCIUM 8.4 8.3* 8.7 8.7 8.7    Liver Function Tests:  Recent Labs  Lab 07/09/13 1602 07/10/13 0217 07/11/13 0341  AST 27 24 27   ALT 10 9 10   ALKPHOS 148* 132* 133*  BILITOT 1.2 0.9 0.9  PROT 6.7 5.9* 5.9*  ALBUMIN 3.2* 2.9* 2.9*   No results found for this basename: LIPASE, AMYLASE,  in the last 168 hours No results found for this basename: AMMONIA,  in the last 168 hours  CBC:  Recent Labs Lab 07/09/13 1602 07/09/13 1932 07/10/13 0217 07/11/13 0341  WBC 4.8 4.8 5.2 5.1  NEUTROABS 3.1  --  3.4 3.1  HGB 8.7* 8.1* 7.9* 8.3*  HCT 28.2* 26.0* 25.0* 26.3*  MCV 89.5 88.1 88.7 89.2  PLT 149* 156 139* 153    Cardiac Enzymes:  Recent Labs Lab 07/09/13 1602 07/09/13 1932 07/10/13 0217 07/10/13 0555  TROPONINI <0.30 <0.30 <0.30 <0.30    BNP: BNP (last 3 results)  Recent Labs  07/09/13 1602  PROBNP 1673.0*    CBG: No results found for this basename: GLUCAP,  in the last 168 hours  Coagulation Studies: No results found for this basename: LABPROT, INR,  in the last 72 hours    Imaging:  No results found.   Medications:     Current Medications: . ferrous sulfate  325 mg Oral Q breakfast  . furosemide  60 mg Intravenous Q8H  . gabapentin  300 mg Oral QHS  . heparin  5,000 Units Subcutaneous 3 times per day  . lactose free nutrition  237 mL Oral BID BM  . levothyroxine  200 mcg Oral QAC breakfast  . metolazone  2.5 mg Oral Daily  . metoprolol tartrate  12.5 mg Oral BID  .  polyethylene glycol  17 g Oral Daily  . sodium chloride  3 mL Intravenous Q12H  . sodium chloride  3 mL Intravenous Q12H     Infusions:      Assessment:   1) A/C diastolic HF 2) HTN 3) ?OSA/OHS 4) AI s/p AVR 5) Obese 6) hypothyroidism s/p thyroidectomy 7) Hypokalemia  Plan/Discussion:    Mrs. Reasor is a 73 yo female who has been admitted multiple times for diastolic HF at [facility]. She reports weighing daily, however never was taught about sliding scale diuretics.  She remains markedly volume overload. Will increase lasix to 80 mg TID and increase metolazone to 5 mg BID. Likely will need torsemide at home.   Abdomen very distended and has anasarca may need [place] with guided paracentesis.  If she becomes hypotensive with diuresis would stop losartan.   Consult CR for ambulation and education.  Length of Stay: [place] 07/16/2013, 12:37 PM  Advanced Heart Failure Team Pager [phone] (M-F; 7a - 4p)  Please contact [facility] for night-coverage after hours (4p -7a ) and weekends on [email or web address]  Patient seen and examined with Junie Bame, NP. We discussed all aspects of the encounter. I agree with the assessment and plan as stated above.   Difficult situation. She is improving with diuresis but expect she has at least 20 pounds to go. Will increase and metolazone. She seems to have problems with taking higher doses of oral diuretics at home and suspect this may be related to why she has had so many readmits. Once she is fully diuresed we will switch her to oral demadex and they we will need to keep her in hospital until we achieve stable outpatient regimen. i have reviewed echo personally. Mitral stenosis is moderate and can be managed medically. We will follow closely  and continue with HF teaching. Would hold of on paracentesis for now. See how she does with diuresis.  Shaune Pascal Bensimhon,MD 6:32 PM

## 2013-07-16 NOTE — Progress Notes (Signed)
UR completed Joell Buerger K. Larinda Herter, RN, BSN, MSHL, CCM  07/16/2013 8:10 PM

## 2013-07-16 NOTE — Progress Notes (Signed)
Attempted PIV insertion in left forearm for routine IV restart, attempt unsuccessful.  Unable to assess second site.  IV team notified and aware.  Will continue to monitor.

## 2013-07-16 NOTE — Progress Notes (Signed)
[facility] PROGRESS NOTE  Filed Weights   07/14/13 0541 07/15/13 0517 07/16/13 7619  Weight: 95.8 kg (211 lb 3.2 oz) 95 kg (209 lb 7 oz) 94.68 kg (208 lb 11.7 oz)        Intake/Output Summary (Last 24 hours) at 07/16/13 1257 Last data filed at 07/16/13 1029  Gross per 24 hour  Intake   1209 ml  Output   1975 ml  Net   -766 ml     Assessment/Plan:  Acute respiratory failure due to acute on chronic diastolic HF: -BP now soft and patient still with signs of been fluid overload; will continue lasix to IV (individual dosages, to help discharge quantifying oral regimen), will continue metolazone -since still fluid overload and her diuresis is marginal with soft BP now; will consult heart failure team for further rec's -continue daily weight, fluid restriction, low sodium diet. -monitor electrolytes, replete as needed. -patient fluid status is better but still with extra fluid on board. -cardiac markers negative x 3 and patient denies CP -continue TED hose -patient continue to have good diuresis (82ml overnight)   Essential HTN: -cont home antihypertensive meds. -BP soft but stable  Anemia: -no signs of overt bleeding -continue ferrous sulfate -Hgb stable  Neuropathy: -continue gabapentin  Hypothyroidism: -TSH 7.3 -synthroid adjusted to 261mcg during this admission -will need repeat thyroid function test in 4 weeks  Mitral stenosis: -patient will like to follow and discussed with cardiology future treatments -currently plan is for medication management   Code Status: full Family Communication: none  Disposition Plan: remains inpatient   Consultants:  Heart failure team  Procedures:  ECHO:  - Left ventricle: The cavity size was normal. Wall thickness was normal. The estimated ejection fraction was 55%. - Aortic valve: There was mild stenosis. Valve area (Vmax): 0.88 cm^2. - Mitral valve: There is severe mitral annular calcificatin  causing moderate stenosis across the mitral oriface There was mild regurgitation. Valve area by continuity equation (using LVOT flow): 0.92 cm^2. - Left atrium: The atrium was moderately dilated. - Right ventricle: The cavity size was mildly dilated. - Right atrium: The atrium was mildly dilated. - Tricuspid valve: There was moderate regurgitation.   LE duplex: no DVT, baker cyst or SVT.  Antibiotics:  none (indicate start date, and stop date if known)  HPI/Subjective:. Afebrile. No CP and no SOB. Still fluid overload.  Objective: Filed Vitals:   07/15/13 1330 07/15/13 2204 07/16/13 0633 07/16/13 1029  BP: 93/48 106/42 110/72 106/47  Pulse: 75 84 80 80  Temp: 97.4 F (36.3 C) 97.7 F (36.5 C) 97.8 F (36.6 C)   TempSrc: Oral Oral Oral   Resp: 20 18 18    Height:      Weight:   94.68 kg (208 lb 11.7 oz)   SpO2: 90% 93% 98%      Exam:  General: in no acute distress; reports breathing is at baseline and feel swelling continue improving.  HEENT: No bruits, no goiter. Mild JVD Heart: Regular rate and rhythm, 2++ edema, she is also edematous in her abdomen, thighs and lower back. Lungs: no wheezing, no frank crackles auscultated; good air movement  Abdomen: Soft, nontender, nondistended, positive bowel sounds.   Data Reviewed: Basic Metabolic Panel:  Recent Labs Lab 07/12/13 0357 07/13/13 0649 07/14/13 0500 07/15/13 0419 07/16/13 0359  NA 132* 132* 131* 133* 133*  K 4.0 3.4* 3.5* 3.7 3.2*  CL 96 93* 91* 92* 90*  CO2 24 25 27 27  28  GLUCOSE 87 83 88 91 83  BUN 11 14 17 19 18   CREATININE 0.81 1.14* 1.07 0.91 0.85  CALCIUM 8.4 8.3* 8.7 8.7 8.7   Liver Function Tests:  Recent Labs Lab 07/09/13 1602 07/10/13 0217 07/11/13 0341  AST 27 24 27   ALT 10 9 10   ALKPHOS 148* 132* 133*  BILITOT 1.2 0.9 0.9  PROT 6.7 5.9* 5.9*  ALBUMIN 3.2* 2.9* 2.9*   CBC:  Recent Labs Lab 07/09/13 1602 07/09/13 1932 07/10/13 0217 07/11/13 0341  WBC 4.8 4.8 5.2 5.1   NEUTROABS 3.1  --  3.4 3.1  HGB 8.7* 8.1* 7.9* 8.3*  HCT 28.2* 26.0* 25.0* 26.3*  MCV 89.5 88.1 88.7 89.2  PLT 149* 156 139* 153   Cardiac Enzymes:  Recent Labs Lab 07/09/13 1602 07/09/13 1932 07/10/13 0217 07/10/13 0555  TROPONINI <0.30 <0.30 <0.30 <0.30   BNP (last 3 results)  Recent Labs  07/09/13 1602  PROBNP 1673.0*    Studies: No results found.  Scheduled Meds: . ferrous sulfate  325 mg Oral Q breakfast  . furosemide  60 mg Intravenous Q8H  . gabapentin  300 mg Oral QHS  . heparin  5,000 Units Subcutaneous 3 times per day  . lactose free nutrition  237 mL Oral BID BM  . levothyroxine  200 mcg Oral QAC breakfast  . metolazone  2.5 mg Oral Daily  . metoprolol tartrate  12.5 mg Oral BID  . polyethylene glycol  17 g Oral Daily  . sodium chloride  3 mL Intravenous Q12H  . sodium chloride  3 mL Intravenous Q12H   Continuous Infusions:   Time > 30 minutes  Barton Dubois  [facility] Pager [phone] If 8PM-8AM, please contact night-coverage at [email or web address], password [email or web address] 07/16/2013, 12:57 PM  LOS: 7 days     **Disclaimer: This note may have been dictated with voice recognition software. Similar sounding words can inadvertently be transcribed and this note may contain transcription errors which may not have been corrected upon publication of note.**

## 2013-07-16 NOTE — Progress Notes (Signed)
Report given to receiving RN. Patient in bed resting watching TV. No verbal complaints and no signs or symptoms of distress or discomfort noted.

## 2013-07-17 DIAGNOSIS — E876 Hypokalemia: Secondary | ICD-10-CM

## 2013-07-17 LAB — BASIC METABOLIC PANEL
BUN: 18 mg/dL (ref 6–23)
CALCIUM: 8.6 mg/dL (ref 8.4–10.5)
CO2: 29 meq/L (ref 19–32)
Chloride: 86 mEq/L — ABNORMAL LOW (ref 96–112)
Creatinine, Ser: 0.77 mg/dL (ref 0.50–1.10)
GFR, EST NON AFRICAN AMERICAN: 81 mL/min — AB (ref >90)
Glucose, Bld: 84 mg/dL (ref 70–99)
Potassium: 2.8 mEq/L — CL (ref 3.7–5.3)
SODIUM: 130 meq/L — AB (ref 137–147)

## 2013-07-17 MED ORDER — POTASSIUM CHLORIDE CRYS ER 20 MEQ PO TBCR
40.0000 meq | EXTENDED_RELEASE_TABLET | Freq: Once | ORAL | Status: AC
  Administered 2013-07-17: 40 meq via ORAL
  Filled 2013-07-17: qty 2

## 2013-07-17 MED ORDER — POTASSIUM CHLORIDE CRYS ER 20 MEQ PO TBCR
40.0000 meq | EXTENDED_RELEASE_TABLET | Freq: Three times a day (TID) | ORAL | Status: DC
  Administered 2013-07-17 – 2013-07-20 (×10): 40 meq via ORAL
  Filled 2013-07-17 (×12): qty 2

## 2013-07-17 MED ORDER — SPIRONOLACTONE 25 MG PO TABS
25.0000 mg | ORAL_TABLET | Freq: Two times a day (BID) | ORAL | Status: DC
  Administered 2013-07-17 – 2013-07-18 (×2): 25 mg via ORAL
  Filled 2013-07-17 (×5): qty 1

## 2013-07-17 NOTE — Progress Notes (Signed)
Report given to receiving RN. Patient in bed resting watching TV. No verbal complaints and no signs or symptoms of distress or discomfort noted.

## 2013-07-17 NOTE — Progress Notes (Signed)
Advanced Heart Failure Rounding Note  Primary Physician: Rubie Maid, MD  Primary Cardiologist: Dr. Wyline Copas  Subjective:    Nicole Hansen is a 73 yo female with a history of AI s/p AVR (bovine), hypothyroidism s/p thyroidectomy, HTN, obesity and diastolic HF.   She has been admitted multiple times to [facility] for volume overload. During her stay at [facility] she would be diuresed and then was transitioned to PO lasix 20 mg for home. Daughter and patient report she can't take more than 20 mg daily of lasix because she urinates on the floor because she can't get to the bathroom in time. Of note she was transfused with 4 PRBC at [facility] within the past month and upper and lower endo was negative for bleeding.   Pertinent labs on admission Hemoglobin 8.7 creatinine 0.75, Pro BNP 1673 and Trop WNL. Started on lasix gtt with metolazone and now on IV lasix 60 mg q8hrs. Weight down 7 lbs since admission. Creatinine stablel 0.85.   ECHO 07/10/13: EF 55%, mild AI and valve area 0.88, moderate Nicole (mean = 8) with severe mitral annular calcification, LA mod dilated and RV mildly dilated, moderate TR  Increased IV lasix yesterday to 80 mg IV TID and metolazone 5 mg BID. 24 hr I/O -2.5 liters and weight down 1 lb. Denies SOB, orthopnea or CP. Creatinine stable.   Objective:   Weight Range:  Vital Signs:   Temp:  [97.2 F (36.2 C)-97.5 F (36.4 C)] 97.3 F (36.3 C) (06/09 0410) Pulse Rate:  [70-82] 82 (06/09 0955) Resp:  [18-20] 18 (06/09 0410) BP: (98-122)/(40-48) 100/43 mmHg (06/09 0955) SpO2:  [91 %-97 %] 91 % (06/09 0410) Weight:  [207 lb 7.3 oz (94.1 kg)] 207 lb 7.3 oz (94.1 kg) (06/09 0410) Last BM Date: 07/13/13  Weight change: Filed Weights   07/15/13 0517 07/16/13 0633 07/17/13 0410  Weight: 209 lb 7 oz (95 kg) 208 lb 11.7 oz (94.68 kg) 207 lb 7.3 oz (94.1 kg)    Intake/Output:   Intake/Output Summary (Last 24 hours) at 07/17/13 1012 Last data filed at 07/17/13 1000  Gross per 24 hour   Intake   1203 ml  Output   4150 ml  Net  -2947 ml     Physical Exam: General: Well appearing. No resp difficulty, sitting on side of bed  HEENT: normal  Neck: supple. JVP to jaw. Carotids 2+ bilat; no bruits. No lymphadenopathy or thryomegaly appreciated.  Cor: PMI nondisplaced. Regular rate & rhythm. No rubs or gallops, 2/6 AS and MR  Lungs: clear  Abdomen: tight, nontender, ++distended. No hepatosplenomegaly. No bruits or masses. Good bowel sounds.  Extremities: no cyanosis, clubbing, rash, 3-4+ thick woody edema  Neuro: alert & orientedx3, cranial nerves grossly intact. moves all 4 extremities w/o difficulty. Affect pleasant  Telemetry: SR  Labs: Basic Metabolic Panel:  Recent Labs Lab 07/13/13 0649 07/14/13 0500 07/15/13 0419 07/16/13 0359 07/17/13 0345  NA 132* 131* 133* 133* 130*  K 3.4* 3.5* 3.7 3.2* 2.8*  CL 93* 91* 92* 90* 86*  CO2 25 27 27 28 29   GLUCOSE 83 88 91 83 84  BUN 14 17 19 18 18   CREATININE 1.14* 1.07 0.91 0.85 0.77  CALCIUM 8.3* 8.7 8.7 8.7 8.6    Liver Function Tests:  Recent Labs Lab 07/11/13 0341  AST 27  ALT 10  ALKPHOS 133*  BILITOT 0.9  PROT 5.9*  ALBUMIN 2.9*   No results found for this basename: LIPASE, AMYLASE,  in  the last 168 hours No results found for this basename: AMMONIA,  in the last 168 hours  CBC:  Recent Labs Lab 07/11/13 0341  WBC 5.1  NEUTROABS 3.1  HGB 8.3*  HCT 26.3*  MCV 89.2  PLT 153    Cardiac Enzymes: No results found for this basename: CKTOTAL, CKMB, CKMBINDEX, TROPONINI,  in the last 168 hours  BNP: BNP (last 3 results)  Recent Labs  07/09/13 1602  PROBNP 1673.0*     Other results:    Imaging:  No results found.   Medications:     Scheduled Medications: . ferrous sulfate  325 mg Oral Q breakfast  . furosemide  80 mg Intravenous Q8H  . gabapentin  300 mg Oral QHS  . heparin  5,000 Units Subcutaneous 3 times per day  . lactose free nutrition  237 mL Oral BID BM  .  levothyroxine  200 mcg Oral QAC breakfast  . metolazone  5 mg Oral Daily  . metoprolol tartrate  12.5 mg Oral BID  . polyethylene glycol  17 g Oral BID  . potassium chloride  40 mEq Oral TID  . sodium chloride  3 mL Intravenous Q12H  . sodium chloride  3 mL Intravenous Q12H     Infusions:     PRN Medications:  sodium chloride, acetaminophen, alum & mag hydroxide-simeth, famotidine, guaiFENesin-dextromethorphan, ondansetron, sodium chloride   Assessment:   1) A/C diastolic HF  2) HTN 3) ?OSA/OHS  4) AI s/p AVR  5) Obese  6) hypothyroidism s/p thyroidectomy  7) Hypokalemia  Plan/Discussion:    Nicole Hansen is a 73 yo female who has been admitted multiple times for diastolic HF at [facility].   Volume status remains elevated on diuretics, but she is diuresing briskly. Will continue current plan. She may need paracentesis but will hold off currently. Will stop BB.  Will call ortho techs to wrap legs.   Continue to ambulate in the halls and work with CR on HF education.  She will need to remain in the hospital a couple of days after transitioning off IV diuretics once we get her euvolemic in order to to determine home regimen. Will likely need torsemide and not furosemide.   Length of Stay: [place] NP-C 07/17/2013, 10:12 AM  Advanced Heart Failure Team Pager [phone] (M-F; 7a - 4p)  Please contact [facility] for night-coverage after hours (4p -7a ) and weekends on [email or web address]   Patient seen and examined with Junie Bame, NP. We discussed all aspects of the encounter. I agree with the assessment and plan as stated above.   Still with anasarca. Starting to mobilize fluid. Will continue lasix 80 iv tid and metolazone 5 bid. Add spironolactone 25 bid. Supp K+. Place UNNA boots.    Keldon Lassen R Tabias Swayze,MD 11:19 AM

## 2013-07-17 NOTE — Progress Notes (Signed)
PA made aware of BP 92/48. Patient is due for scheduled lasix and spirolactone. Ok to hold. No verbal complaints and no signs or symptoms of distress or discomfort. Will continue to monitor patient for further changes in condition. Will make receiving RN aware of low BP.

## 2013-07-17 NOTE — Progress Notes (Signed)
Orthopedic Tech Progress Note Patient Details:  Nicole Hansen 1940-06-18 [id number]  Ortho Devices Type of Ortho Device: Haematologist Ortho Device/Splint Location: Bilateral unna boots Ortho Device/Splint Interventions: Application   [name] [name] 07/17/2013, 1:08 PM

## 2013-07-17 NOTE — Progress Notes (Signed)
CRITICAL VALUE ALERT  Critical value received:  Potassium 2.8  Date of notification:  07/17/2013   Time of notification:  0549  Critical value read back:yes  Nurse who received alert:  Ronnette Hila, RN   MD notified (1st page):  Tylene Fantasia  Time of first page:  05:55  MD notified (2nd page):  Time of second page:  Responding MD:  Tylene Fantasia  Time MD responded:  06:00, new order for PO potassium supplement

## 2013-07-17 NOTE — Progress Notes (Signed)
[facility] PROGRESS NOTE  Filed Weights   07/15/13 0517 07/16/13 8144 07/17/13 0410  Weight: 95 kg (209 lb 7 oz) 94.68 kg (208 lb 11.7 oz) 94.1 kg (207 lb 7.3 oz)        Intake/Output Summary (Last 24 hours) at 07/17/13 2016 Last data filed at 07/17/13 1747  Gross per 24 hour  Intake   1560 ml  Output   3850 ml  Net  -2290 ml     Assessment/Plan:  Acute respiratory failure due to acute on chronic diastolic HF: -BP now soft and patient still with signs of been fluid overload; will continue lasix to IV (individual dosages, to help discharge quantifying oral regimen), will continue metolazone -since still fluid overload and her diuresis is marginal with soft BP now; will consult heart failure team for further rec's -plan is to add spironolactone and lasix/metolazone doses has been increased -continue daily weight, fluid restriction, low sodium diet. -monitor electrolytes, replete as needed. -patient fluid status is better but still with extra fluid on board. -cardiac markers negative x 3 and patient denies CP -plan is to apply unna boots -patient with improved diuresis (neg 2.2 L)   Essential HTN: -cont home antihypertensive meds. -BP soft but stable  Anemia: -no signs of overt bleeding -continue ferrous sulfate -Hgb stable  Neuropathy: -continue gabapentin  Hypothyroidism: -TSH 7.3 -synthroid adjusted to 276mcg during this admission -will need repeat thyroid function test in 4 weeks  Mitral stenosis: -patient will like to follow and discussed with cardiology future treatments -currently plan is for medication management   Code Status: full Family Communication: none  Disposition Plan: remains inpatient   Consultants:  Heart failure team  Procedures:  ECHO:  - Left ventricle: The cavity size was normal. Wall thickness was normal. The estimated ejection fraction was 55%. - Aortic valve: There was mild stenosis. Valve area (Vmax): 0.88 cm^2. -  Mitral valve: There is severe mitral annular calcificatin causing moderate stenosis across the mitral oriface There was mild regurgitation. Valve area by continuity equation (using LVOT flow): 0.92 cm^2. - Left atrium: The atrium was moderately dilated. - Right ventricle: The cavity size was mildly dilated. - Right atrium: The atrium was mildly dilated. - Tricuspid valve: There was moderate regurgitation.   LE duplex: no DVT, baker cyst or SVT.  Antibiotics:  none (indicate start date, and stop date if known)  HPI/Subjective:. Afebrile. No CP and no SOB. Still with signs of fluid overload, but improving. Patient negative 2.2 L  Objective: Filed Vitals:   07/17/13 0955 07/17/13 1310 07/17/13 1452 07/17/13 1755  BP: 100/43 101/82 110/53 92/48  Pulse: 82 76 77 74  Temp:   97.5 F (36.4 C)   TempSrc:   Oral   Resp:   20   Height:      Weight:      SpO2:   94%      Exam:  General: in no acute distress; reports breathing is at baseline and feel swelling continue improving.  HEENT: No bruits, no goiter. Mild JVD Heart: Regular rate and rhythm, 2++ edema, she is also edematous in her abdomen, thighs and lower back. Lungs: no wheezing, no frank crackles auscultated; good air movement  Abdomen: Soft, nontender, nondistended, positive bowel sounds.   Data Reviewed: Basic Metabolic Panel:  Recent Labs Lab 07/13/13 0649 07/14/13 0500 07/15/13 0419 07/16/13 0359 07/17/13 0345  NA 132* 131* 133* 133* 130*  K 3.4* 3.5* 3.7 3.2* 2.8*  CL 93* 91* 92*  90* 86*  CO2 25 27 27 28 29   GLUCOSE 83 88 91 83 84  BUN 14 17 19 18 18   CREATININE 1.14* 1.07 0.91 0.85 0.77  CALCIUM 8.3* 8.7 8.7 8.7 8.6   Liver Function Tests:  Recent Labs Lab 07/11/13 0341  AST 27  ALT 10  ALKPHOS 133*  BILITOT 0.9  PROT 5.9*  ALBUMIN 2.9*   CBC:  Recent Labs Lab 07/11/13 0341  WBC 5.1  NEUTROABS 3.1  HGB 8.3*  HCT 26.3*  MCV 89.2  PLT 153   Cardiac Enzymes: No results found for  this basename: CKTOTAL, CKMB, CKMBINDEX, TROPONINI,  in the last 168 hours BNP (last 3 results)  Recent Labs  07/09/13 1602  PROBNP 1673.0*    Studies: No results found.  Scheduled Meds: . ferrous sulfate  325 mg Oral Q breakfast  . furosemide  80 mg Intravenous Q8H  . gabapentin  300 mg Oral QHS  . heparin  5,000 Units Subcutaneous 3 times per day  . lactose free nutrition  237 mL Oral BID BM  . levothyroxine  200 mcg Oral QAC breakfast  . metolazone  5 mg Oral Daily  . polyethylene glycol  17 g Oral BID  . potassium chloride  40 mEq Oral TID  . sodium chloride  3 mL Intravenous Q12H  . sodium chloride  3 mL Intravenous Q12H  . spironolactone  25 mg Oral BID   Continuous Infusions:   Time > 30 minutes  Barton Dubois  [facility] Pager [phone] If 8PM-8AM, please contact night-coverage at [email or web address], password [email or web address] 07/17/2013, 8:16 PM  LOS: 8 days     **Disclaimer: This note may have been dictated with voice recognition software. Similar sounding words can inadvertently be transcribed and this note may contain transcription errors which may not have been corrected upon publication of note.**

## 2013-07-18 DIAGNOSIS — E876 Hypokalemia: Secondary | ICD-10-CM

## 2013-07-18 LAB — URINALYSIS, ROUTINE W REFLEX MICROSCOPIC
BILIRUBIN URINE: NEGATIVE
GLUCOSE, UA: NEGATIVE mg/dL
HGB URINE DIPSTICK: NEGATIVE
Ketones, ur: NEGATIVE mg/dL
Leukocytes, UA: NEGATIVE
Nitrite: NEGATIVE
Protein, ur: NEGATIVE mg/dL
SPECIFIC GRAVITY, URINE: 1.01 (ref 1.005–1.030)
Urobilinogen, UA: 2 mg/dL — ABNORMAL HIGH (ref 0.0–1.0)
pH: 8 (ref 5.0–8.0)

## 2013-07-18 LAB — BASIC METABOLIC PANEL
BUN: 17 mg/dL (ref 6–23)
CO2: 33 meq/L — AB (ref 19–32)
Calcium: 9 mg/dL (ref 8.4–10.5)
Chloride: 90 mEq/L — ABNORMAL LOW (ref 96–112)
Creatinine, Ser: 0.83 mg/dL (ref 0.50–1.10)
GFR calc Af Amer: 79 mL/min — ABNORMAL LOW (ref >90)
GFR calc non Af Amer: 68 mL/min — ABNORMAL LOW (ref >90)
GLUCOSE: 133 mg/dL — AB (ref 70–99)
POTASSIUM: 3.8 meq/L (ref 3.7–5.3)
SODIUM: 136 meq/L — AB (ref 137–147)

## 2013-07-18 MED ORDER — FUROSEMIDE 10 MG/ML IJ SOLN: 80.0000 mg | Freq: Once | INTRAMUSCULAR | Status: DC

## 2013-07-18 MED ORDER — SPIRONOLACTONE 25 MG PO TABS
25.0000 mg | ORAL_TABLET | Freq: Every day | ORAL | Status: DC
  Administered 2013-07-19 – 2013-07-26 (×8): 25 mg via ORAL
  Filled 2013-07-18 (×8): qty 1

## 2013-07-18 MED ORDER — FUROSEMIDE 10 MG/ML IJ SOLN
10.0000 mg/h | INTRAVENOUS | Status: DC
  Administered 2013-07-18 – 2013-07-25 (×7): 10 mg/h via INTRAVENOUS
  Filled 2013-07-18 (×18): qty 25

## 2013-07-18 NOTE — Progress Notes (Signed)
Report given to receiving RN. Patient in bed resting watching TV. No verbal complaints and no signs or symptoms of distress or discomfort noted.

## 2013-07-18 NOTE — Progress Notes (Signed)
Advanced Heart Failure Rounding Note  Primary Physician: Rubie Maid, MD  Primary Cardiologist: Dr. Wyline Copas  Subjective:    Nicole Hansen is a 73 yo female with a history of AI s/p AVR (bovine), hypothyroidism s/p thyroidectomy, HTN, obesity and diastolic HF.   She has been admitted multiple times to [facility] for volume overload. During her stay at [facility] she would be diuresed and then was transitioned to PO lasix 20 mg for home. Daughter and patient report she can't take more than 20 mg daily of lasix because she urinates on the floor because she can't get to the bathroom in time. Of note she was transfused with 4 PRBC at [facility] within the past month and upper and lower endo was negative for bleeding.   Pertinent labs on admission Hemoglobin 8.7 creatinine 0.75, Pro BNP 1673 and Trop WNL. Started on lasix gtt with metolazone and now on IV lasix 60 mg q8hrs. Weight down 7 lbs since admission. Creatinine stablel 0.85.   ECHO 07/10/13: EF 55%, mild AI and valve area 0.88, moderate Nicole (mean = 8) with severe mitral annular calcification, LA mod dilated and RV mildly dilated, moderate TR  Remains on lasix 80 mg IV TID and metolazone 5 mg daily. 24 hr I/O -1.1 liters. Denies SOB, orthopnea or CP. BMET pending. BB stopped yesterday. Weight down 3 lbs. Unna boots placed. Sprio 25 mg BID started yesterday.   Objective:   Weight Range:  Vital Signs:   Temp:  [97.5 F (36.4 C)-98.2 F (36.8 C)] 98.2 F (36.8 C) (06/10 0622) Pulse Rate:  [74-87] 87 (06/10 0622) Resp:  [16-20] 16 (06/10 0622) BP: (92-112)/(43-82) 108/51 mmHg (06/10 0622) SpO2:  [94 %-96 %] 94 % (06/10 0622) Weight:  [204 lb 3.2 oz (92.625 kg)] 204 lb 3.2 oz (92.625 kg) (06/10 0622) Last BM Date: 07/17/13  Weight change: Filed Weights   07/16/13 0633 07/17/13 0410 07/18/13 0622  Weight: 208 lb 11.7 oz (94.68 kg) 207 lb 7.3 oz (94.1 kg) 204 lb 3.2 oz (92.625 kg)    Intake/Output:   Intake/Output Summary (Last 24 hours) at  07/18/13 0857 Last data filed at 07/18/13 0833  Gross per 24 hour  Intake   1080 ml  Output   2950 ml  Net  -1870 ml     Physical Exam: General: Well appearing. No resp difficulty, sitting on side of bed  HEENT: normal  Neck: supple. JVP 9-10. Carotids 2+ bilat; no bruits. No lymphadenopathy or thryomegaly appreciated.  Cor: PMI nondisplaced. Regular rate & rhythm. No rubs or gallops, 2/6 AS and MR  Lungs: clear  Abdomen: tight, nontender, ++distended. No hepatosplenomegaly. No bruits or masses. Good bowel sounds.  Extremities: no cyanosis, clubbing, rash, 3 + thick woody edema to thighs Neuro: alert & orientedx3, cranial nerves grossly intact. moves all 4 extremities w/o difficulty. Affect pleasant  Telemetry: SR  Labs: Basic Metabolic Panel:  Recent Labs Lab 07/13/13 0649 07/14/13 0500 07/15/13 0419 07/16/13 0359 07/17/13 0345  NA 132* 131* 133* 133* 130*  K 3.4* 3.5* 3.7 3.2* 2.8*  CL 93* 91* 92* 90* 86*  CO2 25 27 27 28 29   GLUCOSE 83 88 91 83 84  BUN 14 17 19 18 18   CREATININE 1.14* 1.07 0.91 0.85 0.77  CALCIUM 8.3* 8.7 8.7 8.7 8.6    Liver Function Tests: No results found for this basename: AST, ALT, ALKPHOS, BILITOT, PROT, ALBUMIN,  in the last 168 hours No results found for this basename: LIPASE, AMYLASE,  in the last 168 hours No results found for this basename: AMMONIA,  in the last 168 hours  CBC: No results found for this basename: WBC, NEUTROABS, HGB, HCT, MCV, PLT,  in the last 168 hours  Cardiac Enzymes: No results found for this basename: CKTOTAL, CKMB, CKMBINDEX, TROPONINI,  in the last 168 hours  BNP: BNP (last 3 results)  Recent Labs  07/09/13 1602  PROBNP 1673.0*     Other results:    Imaging: No results found.   Medications:     Scheduled Medications: . ferrous sulfate  325 mg Oral Q breakfast  . furosemide  80 mg Intravenous Q8H  . gabapentin  300 mg Oral QHS  . heparin  5,000 Units Subcutaneous 3 times per day  .  lactose free nutrition  237 mL Oral BID BM  . levothyroxine  200 mcg Oral QAC breakfast  . metolazone  5 mg Oral Daily  . polyethylene glycol  17 g Oral BID  . potassium chloride  40 mEq Oral TID  . sodium chloride  3 mL Intravenous Q12H  . sodium chloride  3 mL Intravenous Q12H  . spironolactone  25 mg Oral BID    Infusions:    PRN Medications: sodium chloride, acetaminophen, alum & mag hydroxide-simeth, famotidine, guaiFENesin-dextromethorphan, ondansetron, sodium chloride   Assessment:   1) A/C diastolic HF  2) HTN 3) ?OSA/OHS  4) AI s/p AVR  5) Obese  6) hypothyroidism s/p thyroidectomy  7) Hypokalemia  Plan/Discussion:    Nicole Hansen is a 73 yo female who has been admitted multiple times for diastolic HF at [facility].   Volume status improving however remains elevated. Will switch to lasix gtt 10 mg/hr and continue metolazone 5 mg daily. BMET pending. Will change spiro to 25 mg daily to help with BP.   Continue to ambulate in the halls and work with CR on HF education.  She will need to remain in the hospital a couple of days after transitioning off IV diuretics once we get her euvolemic in order to to determine home regimen. Will likely need torsemide and not furosemide.   Length of Stay: [place] B NP-C 07/18/2013, 8:57 AM  Advanced Heart Failure Team Pager [phone] (M-F; 7a - 4p)  Please contact [facility] for night-coverage after hours (4p -7a ) and weekends on [email or web address]   Patient seen and examined with Junie Bame, NP. We discussed all aspects of the encounter. I agree with the assessment and plan as stated above.   Patient seen and examined with Junie Bame, NP. We discussed all aspects of the encounter. I agree with the assessment and plan as stated above. Diuresing well. Still with very marked volume overload/ansarca. Will switch to lasix gtt and continue metolazone. Back down on spiro to allow BP room for diuresis. Watch  electrolytes. BMET pending.   Nicole Hornbaker,MD 11:03 AM

## 2013-07-18 NOTE — Progress Notes (Signed)
Foley catheter inserted. Patient tolerated it well.

## 2013-07-18 NOTE — Progress Notes (Addendum)
Heart Failure Navigator Consult Note  Presentation: Nicole Hansen  is a 73 y.o. 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 Patient gets her care to [facility]. Patient states that she was seen back in March for an upper respiratory infection by her primary care physician. Was put on antibiotics and steroids. Patient had a significant amount of weight gain with steroids and has been subsequently hospitalized several times for shortness of breath. Station she's been seen several times by the cardiologist for swelling and dyspnea. States she has had normal echocardiograms. Was instructed to take higher doses of Lasix and she would like. Daughter and patient state that she can only tolerate up to 20 mg of Lasix daily. Otherwise she urinates excessively on the floor. Patient states she's gained up to 50 pounds of weight since initial steroids dosing. Still up with 30-40 pounds with the 20 mg of Lasix. Denies any chest pain. Has had some chest pressure. Positive orthopnea. Positive lower extremity swelling. States that she was also transfused 4 units of blood and [place] within the past month. Had an upper and lower endoscopy that was negative for any bleeding.     History reviewed. No pertinent past medical history.  History   Social History  . Marital Status: Married    Spouse Name: N/A    Number of Children: N/A  . Years of Education: N/A   Social History Main Topics  . Smoking status: Former Research scientist (life sciences)  . Smokeless tobacco: Former Systems developer    Quit date: 07/10/1984  . Alcohol Use: No  . Drug Use: No  . Sexual Activity: None   Other Topics Concern  . None   Social History Narrative  . None    ECHO:Study Conclusions--07/10/13  - Left ventricle: The cavity size was normal. Wall  thickness was normal. The estimated ejection fraction was 55%. - Aortic valve: There was mild stenosis. Valve area (Vmax): 0.88 cm^2. - Mitral valve: There is severe mitral annular calcificatin causing moderate stenosis across the mitral oriface There was mild regurgitation. Valve area by continuity equation (using LVOT flow): 0.92 cm^2. - Left atrium: The atrium was moderately dilated. - Right ventricle: The cavity size was mildly dilated. - Right atrium: The atrium was mildly dilated. - Tricuspid valve: There was moderate regurgitation.  Transthoracic echocardiography. M-mode, complete 2D, spectral Doppler, and color Doppler. Birthdate: Patient birthdate: [birth year]. Age: Patient is 73 yr old. Sex: Gender: female. Height: Height: 172.7 cm. Height: 68 in. Weight: Weight: 90.7 kg. Weight: 199.5 lb. Body mass index: BMI: 30.4 kg/m^2. Body surface area: BSA: 2.11 m^2. Blood pressure: 107/51 Patient status: Inpatient. Study date: Study date: 07/10/2013. Study time: 11:35 AM.   BNP    Component Value Date/Time   PROBNP 1673.0* 07/09/2013 1602    Education Assessment and Provision:  Detailed education and instructions provided on heart failure disease management including the following:  Signs and symptoms of Heart Failure When to call the physician Importance of daily weights Low sodium diet  Fluid restriction Medication management Anticipated future follow-up appointments  Patient education given on each of the above topics.  Patient acknowledges understanding and acceptance of all instructions.  I spoke at length with Nicole Hansen regarding her HF diagnosis.  She acknowledges that she had received previous education and was able to teach back much of the topics listed  above.  She says that she was living home alone with her husband(13 years old)--however says that now her son has moved in to assist them.  She claims that she eats mostly a low sodium diet.  She does say that she has  a scale and weighs daily at home.  I will plan to come back to visit her to address any questions and reinforce HF education.  Education Materials:  "Living Better With Heart Failure" Booklet, Daily Weight Tracker Tool, Sodium Content of Foods and Heart Failure Nutrition Therapy from Nutrition Care Manual.  High Risk Criteria for Readmission and/or Poor Patient Outcomes:  (Recommend Follow-up with Advanced Heart Failure Clinic)--Yes-she will need quick outpatient follow-up   EF <30%- No --55%  2 or more admissions in 6 months- Yes--at [facility]  Difficult social situation- No--says that son has moved in with she and her husband  Demonstrates medication noncompliance-No   Barriers of Care:  Knowledge of HF and compliance  Discharge Planning:   Plans to discharge to home with husband and son.  She has previously failed on home regimen.  She will need close outpatient follow- up.  I have given her the map and will set up outpatient appt with HF clinic when she is closer to discharge.

## 2013-07-18 NOTE — Progress Notes (Signed)
[facility] PROGRESS NOTE Interim summary 73 y.o. 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 She has been admitted multiple times to [facility] for volume overload. During her stay at [facility] she would be diuresed and then was transitioned to PO lasix 20 mg for home. Daughter and patient report she can't take more than 20 mg daily of lasix because she urinates on the floor because she can't get to the bathroom in time. Of note she was transfused with 4 PRBC at [facility] within the past month and upper and lower endo was negative for acute bleeding.  Pertinent labs on admission Hemoglobin 8.7 creatinine 0.75, Pro BNP 1673 and Trop WNL. Started on lasix gtt with metolazone and eventually transition to IV lasix 80 mg q8hrs (to quantify PO regimen at discharge). Weight down 12 lbs since admission. Creatinine stablel 0.85.  ECHO 07/10/13: EF 55%, mild AI and valve area 0.88, moderate MS (mean = 8) with severe mitral annular calcification, LA mod dilated and RV mildly dilated, moderate TR  Still fluid overload and heart failure team has been consulted; plan is for more diuresis and to restart lasix drip.  Filed Weights   07/16/13 0511 07/17/13 0410 07/18/13 0622  Weight: 94.68 kg (208 lb 11.7 oz) 94.1 kg (207 lb 7.3 oz) 92.625 kg (204 lb 3.2 oz)        Intake/Output Summary (Last 24 hours) at 07/18/13 1403 Last data filed at 07/18/13 1357  Gross per 24 hour  Intake   1080 ml  Output   2151 ml  Net  -1071 ml     Assessment/Plan:  Acute respiratory failure due to acute on chronic diastolic HF: -BP now soft and patient still with signs of been fluid overload; will continue lasix to IV (individual dosages, to help discharge quantifying oral regimen), will continue metolazone -since still fluid  overload and her diuresis is marginal with soft BP now, heart failure team has been consulted. -Appreciate assistance from heart failure team and will follow further rec's -plan is to continue low dose spironolactone; lasix to be switch to drip and continue metolazone at 5mg  BID -continue daily weight, fluid restriction, low sodium diet. -monitor electrolytes, replete as needed. -cardiac markers negative x 3 and patient denies CP -continue unna boots -patient with improved in diuresis; still with significant fluid overload   Essential HTN: -cont home antihypertensive meds. -BP soft but stable  Anemia: -no signs of overt bleeding -continue ferrous sulfate -Hgb stable  Neuropathy: -continue gabapentin  Hypothyroidism: -TSH 7.3 -synthroid adjusted to 239mcg during this admission -will need repeat thyroid function test in 4 weeks  Mitral stenosis: -patient will like to follow and discussed with cardiology future treatments -currently plan is for medication management   Code Status: full Family Communication: none  Disposition Plan: remains inpatient   Consultants:  Heart failure team  Procedures:  ECHO:  - Left ventricle: The cavity size was normal. Wall thickness was normal. The estimated ejection fraction was 55%. - Aortic valve: There was mild stenosis. Valve area (Vmax): 0.88 cm^2. - Mitral valve: There is severe mitral annular calcificatin causing moderate stenosis across the mitral oriface There was mild regurgitation. Valve area by continuity equation (using LVOT flow): 0.92 cm^2. - Left atrium: The atrium was moderately dilated. - Right ventricle: The cavity size was mildly dilated. - Right atrium: The  atrium was mildly dilated. - Tricuspid valve: There was moderate regurgitation.   LE duplex: no DVT, baker cyst or SVT.  Antibiotics:  none (indicate start date, and stop date if known)  HPI/Subjective:. Afebrile. No CP and no SOB. Patient with  improved diuresis, but still fluid overload  Objective: Filed Vitals:   07/17/13 2027 07/18/13 0252 07/18/13 0622 07/18/13 0930  BP: 103/51 112/55 108/51 110/50  Pulse: 79 80 87 91  Temp: 97.5 F (36.4 C)  98.2 F (36.8 C)   TempSrc: Oral  Oral   Resp: 18  16   Height:      Weight:   92.625 kg (204 lb 3.2 oz)   SpO2: 96%  94%      Exam:  General: in no acute distress; reports breathing is at baseline and feel swelling continue improving.  HEENT: No bruits, no goiter. Mild JVD Heart: Regular rate and rhythm, 2++ edema, she is also edematous in her abdomen, thighs and lower back. Lungs: no wheezing, no frank crackles auscultated; good air movement  Abdomen: Soft, nontender, nondistended, positive bowel sounds.   Data Reviewed: Basic Metabolic Panel:  Recent Labs Lab 07/14/13 0500 07/15/13 0419 07/16/13 0359 07/17/13 0345 07/18/13 1010  NA 131* 133* 133* 130* 136*  K 3.5* 3.7 3.2* 2.8* 3.8  CL 91* 92* 90* 86* 90*  CO2 27 27 28 29  33*  GLUCOSE 88 91 83 84 133*  BUN 17 19 18 18 17   CREATININE 1.07 0.91 0.85 0.77 0.83  CALCIUM 8.7 8.7 8.7 8.6 9.0   BNP (last 3 results)  Recent Labs  07/09/13 1602  PROBNP 1673.0*    Studies: No results found.  Scheduled Meds: . ferrous sulfate  325 mg Oral Q breakfast  . furosemide  80 mg Intravenous Once  . gabapentin  300 mg Oral QHS  . heparin  5,000 Units Subcutaneous 3 times per day  . lactose free nutrition  237 mL Oral BID BM  . levothyroxine  200 mcg Oral QAC breakfast  . metolazone  5 mg Oral Daily  . polyethylene glycol  17 g Oral BID  . potassium chloride  40 mEq Oral TID  . sodium chloride  3 mL Intravenous Q12H  . sodium chloride  3 mL Intravenous Q12H  . [START ON 07/19/2013] spironolactone  25 mg Oral Daily   Continuous Infusions: . furosemide (LASIX) infusion 10 mg/hr (07/18/13 1032)   Time > 30 minutes  Barton Dubois  [facility] Pager [phone] If 8PM-8AM, please contact night-coverage at  [email or web address], password [facility] 07/18/2013, 2:03 PM  LOS: 9 days     **Disclaimer: This note may have been dictated with voice recognition software. Similar sounding words can inadvertently be transcribed and this note may contain transcription errors which may not have been corrected upon publication of note.**

## 2013-07-18 NOTE — Progress Notes (Signed)
CARDIAC REHAB PHASE I   PRE:  Rate/Rhythm: 91 off monitor    BP: sitting 110/70    SaO2: 98 RA  MODE:  Ambulation: 230 ft   POST:  Rate/Rhythm: 98 off monitor    BP: sitting 120/56     SaO2: 91 RA  Pt able to walk with RW. Denied SOB although did need to stop and rest x1 on return trip due to fatigue. X1 LOB due to distraction. Pt was happy she was able to walk. SaO2 lower but stable after walking. Began ed and gave pt HF booklet. Discussed daily wts, low sodium, HF guideline. Pt was thankful for the information. Will continue to discuss. To recliner. [phone]   Josephina Shih [place] CES, ACSM 07/18/2013 11:21 AM

## 2013-07-19 LAB — BASIC METABOLIC PANEL
BUN: 18 mg/dL (ref 6–23)
CO2: 29 meq/L (ref 19–32)
Calcium: 8.7 mg/dL (ref 8.4–10.5)
Chloride: 88 mEq/L — ABNORMAL LOW (ref 96–112)
Creatinine, Ser: 0.93 mg/dL (ref 0.50–1.10)
GFR calc Af Amer: 69 mL/min — ABNORMAL LOW (ref >90)
GFR calc non Af Amer: 60 mL/min — ABNORMAL LOW (ref >90)
GLUCOSE: 86 mg/dL (ref 70–99)
POTASSIUM: 4.3 meq/L (ref 3.7–5.3)
Sodium: 133 mEq/L — ABNORMAL LOW (ref 137–147)

## 2013-07-19 MED ORDER — TRAMADOL HCL 50 MG PO TABS
50.0000 mg | ORAL_TABLET | Freq: Once | ORAL | Status: AC
  Administered 2013-07-19: 50 mg via ORAL
  Filled 2013-07-19: qty 1

## 2013-07-19 MED ORDER — LORAZEPAM 0.5 MG PO TABS
0.5000 mg | ORAL_TABLET | Freq: Once | ORAL | Status: AC
  Administered 2013-07-19: 0.5 mg via ORAL
  Filled 2013-07-19: qty 1

## 2013-07-19 MED ORDER — TRAMADOL HCL 50 MG PO TABS
50.0000 mg | ORAL_TABLET | Freq: Four times a day (QID) | ORAL | Status: DC | PRN
  Administered 2013-07-20: 50 mg via ORAL
  Filled 2013-07-19: qty 1

## 2013-07-19 NOTE — Progress Notes (Signed)
Nutrition Brief Note   Patient assessed for Length of Stay >/=10 days   Wt Readings from Last 15 Encounters:  07/19/13 201 lb 1 oz (91.2 kg)    Body mass index is 30.58 kg/(m^2). Patient meets criteria for Obesity Class I based on current BMI.   Current diet order is Heart Healthy, patient is consuming approximately 100% of meals at this time. Labs and medications reviewed.   Pt reports great appetite and really enjoys the food here. Pt has no concerns at this time.   No nutrition interventions warranted at this time. If nutrition issues arise, please consult RD.   Carrolyn Leigh, BS Dietetic Intern Pager: [phone]   I agree with student dietitian note; appropriate revisions have been made.  Molli Barrows, RD, LDN, [place] Pager# [phone] After Hours Pager# [phone]

## 2013-07-19 NOTE — Progress Notes (Signed)
[facility] PROGRESS NOTE Interim summary 73 y.o. 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 She has been admitted multiple times to [facility] for volume overload. During her stay at [facility] she would be diuresed and then was transitioned to PO lasix 20 mg for home. Daughter and patient report she can't take more than 20 mg daily of lasix because she urinates on the floor because she can't get to the bathroom in time. Of note she was transfused with 4 PRBC at [facility] within the past month and upper and lower endo was negative for acute bleeding.  Pertinent labs on admission Hemoglobin 8.7 creatinine 0.75, Pro BNP 1673 and Trop WNL. Started on lasix gtt with metolazone and eventually transition to IV lasix 80 mg q8hrs (to quantify PO regimen at discharge). Weight down 12 lbs since admission. Creatinine stablel 0.85.  ECHO 07/10/13: EF 55%, mild AI and valve area 0.88, moderate MS (mean = 8) with severe mitral annular calcification, LA mod dilated and RV mildly dilated, moderate TR  Still fluid overload and heart failure team has been consulted; plan is for more diuresis and to restart lasix drip.  Filed Weights   07/17/13 0410 07/18/13 0622 07/19/13 0637  Weight: 94.1 kg (207 lb 7.3 oz) 92.625 kg (204 lb 3.2 oz) 91.2 kg (201 lb 1 oz)        Intake/Output Summary (Last 24 hours) at 07/19/13 1244 Last data filed at 07/19/13 0914  Gross per 24 hour  Intake    480 ml  Output   3650 ml  Net  -3170 ml     Assessment/Plan:  Acute respiratory failure due to acute on chronic diastolic HF: - BP now soft and patient still with signs of been fluid overload; will continue lasix ggt and metolazone - Advance heart failure team managing diuretics. Good diuresis with current regimen. - Appreciate  assistance from heart failure team and will follow further rec's - Continue daily weight, fluid restriction, low sodium diet. - Cardiac markers negative x 3 and patient denies CP - Continue unna boots - patient with improved in diuresis; still with significant fluid overload   Essential HTN: -cont home antihypertensive meds. -BP soft but stable  Anemia: -no signs of overt bleeding -continue ferrous sulfate -Hgb stable  Neuropathy: -continue gabapentin  Hypothyroidism: -TSH 7.3 -synthroid adjusted to 235mcg during this admission -will need repeat thyroid function test in 4 weeks  Mitral stenosis: -patient will like to follow and discussed with cardiology future treatments -currently plan is for medication management   Code Status: full Family Communication: none  Disposition Plan: remains inpatient   Consultants:  Heart failure team  Procedures:  ECHO:  - Left ventricle: The cavity size was normal. Wall thickness was normal. The estimated ejection fraction was 55%. - Aortic valve: There was mild stenosis. Valve area (Vmax): 0.88 cm^2. - Mitral valve: There is severe mitral annular calcificatin causing moderate stenosis across the mitral oriface There was mild regurgitation. Valve area by continuity equation (using LVOT flow): 0.92 cm^2. - Left atrium: The atrium was moderately dilated. - Right ventricle: The cavity size was mildly dilated. - Right atrium: The atrium was mildly dilated. - Tricuspid valve: There was moderate regurgitation.   LE duplex: no DVT, baker cyst or SVT.  Antibiotics:  none (indicate start date, and stop date if known)  HPI/Subjective:.  No SOB. Patient with improved diuresis, but still fluid overload  Objective: Filed Vitals:   07/18/13 1415 07/18/13 2138 07/19/13 0637 07/19/13 1018  BP: 106/50 114/38 85/32 109/49  Pulse: 92 92 88 87  Temp: 97.8 F (36.6 C) 98 F (36.7 C) 98.4 F (36.9 C)   TempSrc: Oral Oral Oral   Resp:  18 18 17    Height:      Weight:   91.2 kg (201 lb 1 oz)   SpO2: 95% 95% 91%      Exam:  General: in no acute distress; reports breathing is at baseline and feel swelling continue improving.  HEENT: No bruits, no goiter. Mild JVD Heart: Regular rate and rhythm, 2++ edema, she is also edematous in her abdomen, thighs and lower back. Lungs: no wheezing, no frank crackles auscultated; good air movement  Abdomen: Soft, nontender, nondistended, positive bowel sounds.   Data Reviewed: Basic Metabolic Panel:  Recent Labs Lab 07/15/13 0419 07/16/13 0359 07/17/13 0345 07/18/13 1010 07/19/13 0552  NA 133* 133* 130* 136* 133*  K 3.7 3.2* 2.8* 3.8 4.3  CL 92* 90* 86* 90* 88*  CO2 27 28 29  33* 29  GLUCOSE 91 83 84 133* 86  BUN 19 18 18 17 18   CREATININE 0.91 0.85 0.77 0.83 0.93  CALCIUM 8.7 8.7 8.6 9.0 8.7   BNP (last 3 results)  Recent Labs  07/09/13 1602  PROBNP 1673.0*    Studies: No results found.  Scheduled Meds: . ferrous sulfate  325 mg Oral Q breakfast  . gabapentin  300 mg Oral QHS  . heparin  5,000 Units Subcutaneous 3 times per day  . lactose free nutrition  237 mL Oral BID BM  . levothyroxine  200 mcg Oral QAC breakfast  . metolazone  5 mg Oral Daily  . polyethylene glycol  17 g Oral BID  . potassium chloride  40 mEq Oral TID  . sodium chloride  3 mL Intravenous Q12H  . spironolactone  25 mg Oral Daily   Continuous Infusions: . furosemide (LASIX) infusion 10 mg/hr (07/19/13 1128)   Time > 30 minutes  Charlynne Cousins  [facility] Pager [phone] If 8PM-8AM, please contact night-coverage at [email or web address], password [facility] 07/19/2013, 12:44 PM  LOS: 10 days     **Disclaimer: This note may have been dictated with voice recognition software. Similar sounding words can inadvertently be transcribed and this note may contain transcription errors which may not have been corrected upon publication of note.**

## 2013-07-19 NOTE — Progress Notes (Signed)
CARDIAC REHAB PHASE I   PRE:  Rate/Rhythm: 86 by pulse ox, off monitor    BP: sitting 112/60    SaO2: 93 RA  MODE:  Ambulation: 420 ft   POST:  Rate/Rhythm: 93    BP: sitting 108/68     SaO2: 93 RA  Pt with slow, steady gait with RW. Feeling stronger. To recliner after walk. Will f/u. 1427-1500   Nicole Hansen [place] CES, ACSM 07/19/2013 3:34 PM

## 2013-07-19 NOTE — Progress Notes (Signed)
Pt a/o, no c/o pain, vss, pt stable 

## 2013-07-19 NOTE — Progress Notes (Signed)
Advanced Heart Failure Rounding Note  Primary Physician: Rubie Maid, MD  Primary Cardiologist: Dr. Wyline Copas  Subjective:    Ms Bruster is a 73 yo female with a history of AI s/p AVR (bovine), hypothyroidism s/p thyroidectomy, HTN, obesity and diastolic HF.   She has been admitted multiple times to [facility] for volume overload. During her stay at [facility] she would be diuresed and then was transitioned to PO lasix 20 mg for home. Daughter and patient report she can't take more than 20 mg daily of lasix because she urinates on the floor because she can't get to the bathroom in time. Of note she was transfused with 4 PRBC at [facility] within the past month and upper and lower endo was negative for bleeding.   Pertinent labs on admission Hemoglobin 8.7 creatinine 0.75, Pro BNP 1673 and Trop WNL. Started on lasix gtt with metolazone and now on IV lasix 60 mg q8hrs. Weight down 7 lbs since admission. Creatinine stablel 0.85.   ECHO 07/10/13: EF 55%, mild AI and valve area 0.88, moderate MS (mean = 8) with severe mitral annular calcification, LA mod dilated and RV mildly dilated, moderate TR  Yesterday transitioned to lasix gtt 10 mg/hr with metolazone 5 mg daily. Foley placed. Weight down 3 lbs, 24 hr I/O -4 liters. Denies SOB, orthopnea or CP. Cr stable. Unna boots intact. Leg cramping last night, K 4.3 Objective:   Weight Range:  Vital Signs:   Temp:  [97.8 F (36.6 C)-98.4 F (36.9 C)] 98.4 F (36.9 C) (06/11 5397) Pulse Rate:  [87-92] 87 (06/11 1018) Resp:  [17-18] 17 (06/11 0637) BP: (85-114)/(32-50) 109/49 mmHg (06/11 1018) SpO2:  [91 %-95 %] 91 % (06/11 0637) Weight:  [201 lb 1 oz (91.2 kg)] 201 lb 1 oz (91.2 kg) (06/11 0637) Last BM Date: 07/17/13  Weight change: Filed Weights   07/17/13 0410 07/18/13 0622 07/19/13 0637  Weight: 207 lb 7.3 oz (94.1 kg) 204 lb 3.2 oz (92.625 kg) 201 lb 1 oz (91.2 kg)    Intake/Output:   Intake/Output Summary (Last 24 hours) at 07/19/13  1115 Last data filed at 07/19/13 0914  Gross per 24 hour  Intake    480 ml  Output   3650 ml  Net  -3170 ml     Physical Exam: General: Well appearing. No resp difficulty, lying in bed HEENT: normal  Neck: supple. JVP 10-12. Carotids 2+ bilat; no bruits. No lymphadenopathy or thryomegaly appreciated.  Cor: PMI nondisplaced. Regular rate & rhythm. No rubs or gallops, 2/6 AS and MR  Lungs: clear  Abdomen: tight, nontender, ++distended. No hepatosplenomegaly. No bruits or masses. Good bowel sounds.  Extremities: no cyanosis, clubbing, rash, 3 + thick woody edema to thighs Neuro: alert & orientedx3, cranial nerves grossly intact. moves all 4 extremities w/o difficulty. Affect pleasant  Telemetry: SR  Labs: Basic Metabolic Panel:  Recent Labs Lab 07/15/13 0419 07/16/13 0359 07/17/13 0345 07/18/13 1010 07/19/13 0552  NA 133* 133* 130* 136* 133*  K 3.7 3.2* 2.8* 3.8 4.3  CL 92* 90* 86* 90* 88*  CO2 27 28 29  33* 29  GLUCOSE 91 83 84 133* 86  BUN 19 18 18 17 18   CREATININE 0.91 0.85 0.77 0.83 0.93  CALCIUM 8.7 8.7 8.6 9.0 8.7    Liver Function Tests: No results found for this basename: AST, ALT, ALKPHOS, BILITOT, PROT, ALBUMIN,  in the last 168 hours No results found for this basename: LIPASE, AMYLASE,  in the last 168  hours No results found for this basename: AMMONIA,  in the last 168 hours  CBC: No results found for this basename: WBC, NEUTROABS, HGB, HCT, MCV, PLT,  in the last 168 hours  Cardiac Enzymes: No results found for this basename: CKTOTAL, CKMB, CKMBINDEX, TROPONINI,  in the last 168 hours  BNP: BNP (last 3 results)  Recent Labs  07/09/13 1602  PROBNP 1673.0*     Other results:    Imaging: No results found.   Medications:     Scheduled Medications: . ferrous sulfate  325 mg Oral Q breakfast  . furosemide  80 mg Intravenous Once  . gabapentin  300 mg Oral QHS  . heparin  5,000 Units Subcutaneous 3 times per day  . lactose free nutrition   237 mL Oral BID BM  . levothyroxine  200 mcg Oral QAC breakfast  . metolazone  5 mg Oral Daily  . polyethylene glycol  17 g Oral BID  . potassium chloride  40 mEq Oral TID  . sodium chloride  3 mL Intravenous Q12H  . sodium chloride  3 mL Intravenous Q12H  . spironolactone  25 mg Oral Daily    Infusions: . furosemide (LASIX) infusion 10 mg/hr (07/18/13 1032)    PRN Medications: sodium chloride, acetaminophen, alum & mag hydroxide-simeth, famotidine, guaiFENesin-dextromethorphan, ondansetron, sodium chloride   Assessment:   1) A/C diastolic HF  2) HTN 3) ?OSA/OHS  4) AI s/p AVR  5) Obese  6) hypothyroidism s/p thyroidectomy  7) Hypokalemia  Plan/Discussion:    Mrs. Pullen is a 73 yo female who has been admitted multiple times for diastolic HF at [facility].   Diuresis brisk with lasix gtt and metolazone. She remains markedly volume overloaded and Cr stable. WIll continue current regiment.   Leg cramping but K 4.3. Will continue to follow and told to ask for Tramadol if needed which helped.   She will need to remain in the hospital a couple of days after transitioning off IV diuretics once we get her euvolemic in order to to determine home regimen. Will likely need torsemide and not furosemide.   Length of Stay: [place] B NP-C 07/19/2013, 11:15 AM  Advanced Heart Failure Team Pager [phone] (M-F; 7a - 4p)  Please contact [facility] for night-coverage after hours (4p -7a ) and weekends on [email or web address]   Patient seen and examined with Junie Bame, NP. We discussed all aspects of the encounter. I agree with the assessment and plan as stated above. Continues to diureses well. Still a ways to go. Renal function stable. Bicarb ok. Continue current regimen.   Glenis Musolf,MD 1:24 PM

## 2013-07-20 LAB — FERRITIN: FERRITIN: 72 ng/mL (ref 10–291)

## 2013-07-20 LAB — CBC
HEMATOCRIT: 23 % — AB (ref 36.0–46.0)
HEMOGLOBIN: 7.1 g/dL — AB (ref 12.0–15.0)
MCH: 27.6 pg (ref 26.0–34.0)
MCHC: 30.9 g/dL (ref 30.0–36.0)
MCV: 89.5 fL (ref 78.0–100.0)
Platelets: 146 10*3/uL — ABNORMAL LOW (ref 150–400)
RBC: 2.57 MIL/uL — ABNORMAL LOW (ref 3.87–5.11)
RDW: 22.4 % — ABNORMAL HIGH (ref 11.5–15.5)
WBC: 6.6 10*3/uL (ref 4.0–10.5)

## 2013-07-20 LAB — BASIC METABOLIC PANEL
BUN: 21 mg/dL (ref 6–23)
CHLORIDE: 90 meq/L — AB (ref 96–112)
CO2: 30 mEq/L (ref 19–32)
Calcium: 8.9 mg/dL (ref 8.4–10.5)
Creatinine, Ser: 0.98 mg/dL (ref 0.50–1.10)
GFR calc non Af Amer: 56 mL/min — ABNORMAL LOW (ref >90)
GFR, EST AFRICAN AMERICAN: 65 mL/min — AB (ref >90)
GLUCOSE: 90 mg/dL (ref 70–99)
Potassium: 4.9 mEq/L (ref 3.7–5.3)
Sodium: 134 mEq/L — ABNORMAL LOW (ref 137–147)

## 2013-07-20 LAB — ABO/RH: ABO/RH(D): O POS

## 2013-07-20 LAB — PREPARE RBC (CROSSMATCH)

## 2013-07-20 LAB — IRON AND TIBC
Iron: 326 ug/dL — ABNORMAL HIGH (ref 42–135)
Saturation Ratios: 91 % — ABNORMAL HIGH (ref 20–55)
TIBC: 359 ug/dL (ref 250–470)
UIBC: 33 ug/dL — ABNORMAL LOW (ref 125–400)

## 2013-07-20 MED ORDER — POTASSIUM CHLORIDE CRYS ER 20 MEQ PO TBCR
40.0000 meq | EXTENDED_RELEASE_TABLET | Freq: Two times a day (BID) | ORAL | Status: DC
  Administered 2013-07-20 – 2013-07-26 (×12): 40 meq via ORAL
  Filled 2013-07-20 (×14): qty 2

## 2013-07-20 MED ORDER — SODIUM CHLORIDE 0.9 % IJ SOLN
10.0000 mL | INTRAMUSCULAR | Status: DC | PRN
  Administered 2013-07-21 – 2013-07-24 (×6): 10 mL
  Administered 2013-07-25: 20 mL

## 2013-07-20 NOTE — Progress Notes (Signed)
Advanced Heart Failure Rounding Note  Primary Physician: Rubie Maid, MD  Primary Cardiologist: Dr. Wyline Copas  Subjective:    Ms Doffing is a 73 yo female with a history of AI s/p AVR (bovine), hypothyroidism s/p thyroidectomy, HTN, obesity and diastolic HF.   She has been admitted multiple times to [facility] for volume overload. During her stay at [facility] she would be diuresed and then was transitioned to PO lasix 20 mg for home. Daughter and patient report she can't take more than 20 mg daily of lasix because she urinates on the floor because she can't get to the bathroom in time. Of note she was transfused with 4 PRBC at [facility] within the past month and upper and lower endo was negative for bleeding.   Pertinent labs on admission Hemoglobin 8.7 creatinine 0.75, Pro BNP 1673 and Trop WNL. Started on lasix gtt with metolazone and now on IV lasix 60 mg q8hrs. Weight down 7 lbs since admission. Creatinine stablel 0.85.   ECHO 07/10/13: EF 55%, mild AI and valve area 0.88, moderate MS (mean = 8) with severe mitral annular calcification, LA mod dilated and RV mildly dilated, moderate TR  On lasix gtt 10 mg/hr with metolazone 5 mg daily. Weight down another 3 lbs (total 16 lbs) Denies SOB, orthopnea or CP. Cr stable. Unna boots intact. Ambulating in the halls with no issues.   Objective:   Weight Range:  Vital Signs:   Temp:  [97.5 F (36.4 C)-98.1 F (36.7 C)] 98.1 F (36.7 C) (06/12 0537) Pulse Rate:  [86-93] 93 (06/12 1017) Resp:  [18-19] 18 (06/12 1017) BP: (94-117)/(38-51) 101/38 mmHg (06/12 1017) SpO2:  [92 %-97 %] 97 % (06/12 1017) Weight:  [198 lb 3.1 oz (89.9 kg)] 198 lb 3.1 oz (89.9 kg) (06/12 0537) Last BM Date: 07/19/13  Weight change: Filed Weights   07/18/13 0622 07/19/13 0637 07/20/13 0537  Weight: 204 lb 3.2 oz (92.625 kg) 201 lb 1 oz (91.2 kg) 198 lb 3.1 oz (89.9 kg)    Intake/Output:   Intake/Output Summary (Last 24 hours) at 07/20/13 1141 Last data filed at  07/20/13 0856  Gross per 24 hour  Intake   1080 ml  Output   3425 ml  Net  -2345 ml     Physical Exam: General: Well appearing. No resp difficulty, lying in bed HEENT: normal  Neck: supple. JVP 10-12. Carotids 2+ bilat; no bruits. No lymphadenopathy or thryomegaly appreciated.  Cor: PMI nondisplaced. Regular rate & rhythm. No rubs or gallops, 2/6 AS and MR  Lungs: clear  Abdomen: tight, nontender, ++distended. No hepatosplenomegaly. No bruits or masses. Good bowel sounds.  Extremities: no cyanosis, clubbing, rash, 3 + thick woody edema to thighs Neuro: alert & orientedx3, cranial nerves grossly intact. moves all 4 extremities w/o difficulty. Affect pleasant  Telemetry: SR  Labs: Basic Metabolic Panel:  Recent Labs Lab 07/16/13 0359 07/17/13 0345 07/18/13 1010 07/19/13 0552 07/20/13 0600  NA 133* 130* 136* 133* 134*  K 3.2* 2.8* 3.8 4.3 4.9  CL 90* 86* 90* 88* 90*  CO2 28 29 33* 29 30  GLUCOSE 83 84 133* 86 90  BUN 18 18 17 18 21   CREATININE 0.85 0.77 0.83 0.93 0.98  CALCIUM 8.7 8.6 9.0 8.7 8.9    Liver Function Tests: No results found for this basename: AST, ALT, ALKPHOS, BILITOT, PROT, ALBUMIN,  in the last 168 hours No results found for this basename: LIPASE, AMYLASE,  in the last 168 hours No results  found for this basename: AMMONIA,  in the last 168 hours  CBC:  Recent Labs Lab 07/20/13 0600  WBC 6.6  HGB 7.1*  HCT 23.0*  MCV 89.5  PLT 146*    Cardiac Enzymes: No results found for this basename: CKTOTAL, CKMB, CKMBINDEX, TROPONINI,  in the last 168 hours  BNP: BNP (last 3 results)  Recent Labs  07/09/13 1602  PROBNP 1673.0*     Other results:    Imaging: No results found.   Medications:     Scheduled Medications: . ferrous sulfate  325 mg Oral Q breakfast  . gabapentin  300 mg Oral QHS  . heparin  5,000 Units Subcutaneous 3 times per day  . lactose free nutrition  237 mL Oral BID BM  . levothyroxine  200 mcg Oral QAC breakfast   . metolazone  5 mg Oral Daily  . polyethylene glycol  17 g Oral BID  . potassium chloride  40 mEq Oral TID  . sodium chloride  3 mL Intravenous Q12H  . spironolactone  25 mg Oral Daily    Infusions: . furosemide (LASIX) infusion 10 mg/hr (07/19/13 1128)    PRN Medications: sodium chloride, acetaminophen, alum & mag hydroxide-simeth, famotidine, guaiFENesin-dextromethorphan, ondansetron, sodium chloride, traMADol   Assessment:   1) A/C diastolic HF  2) HTN 3) ?OSA/OHS  4) AI s/p AVR  5) Obese  6) hypothyroidism s/p thyroidectomy  7) Hypokalemia  Plan/Discussion:    Mrs. Trantham is a 73 yo female who has been admitted multiple times for diastolic HF at [place] to diurese with lasix gtt and metolazone. She remains markedly volume overloaded and Cr stable. WIll continue current regiment until Cr starts rising. Still appears to have about 20 lbs on board.  Hgb trending down will place for guaiac stool and transfuse 2 PRBCs. Will check anemia panel.   She will need to remain in the hospital a couple of days after transitioning off IV diuretics once we get her euvolemic in order to to determine home regimen. Will likely need torsemide and not furosemide.   Length of Stay: [place] B NP-C 07/20/2013, 11:41 AM  Advanced Heart Failure Team Pager [phone] (M-F; 7a - 4p)  Please contact [facility] for night-coverage after hours (4p -7a ) and weekends on [email or web address]   Patient seen and examined with Junie Bame, NP. We discussed all aspects of the encounter. I agree with the assessment and plan as stated above.  Doing very well. Continues to diurese. Now down 17 pounds. Still with at least 10-15 pounds to go. Renal function remains stable. Continue current regimen. Agree with transfusion. Check iron stores. Will need outpt GI f/u.  Daniel Bensimhon,MD 2:02 PM

## 2013-07-20 NOTE — Progress Notes (Signed)
PT Cancellation Note  Patient Details Name: Nicole Hansen MRN: [id number] DOB: 1940-08-31   Cancelled Treatment:    Reason Eval/Treat Not Completed: Other (comment) Politely declining amb this afternoon; Noted she walks quite well with Cardiac Rehab   [place] 07/20/2013, 2:45 PM

## 2013-07-20 NOTE — Progress Notes (Signed)
1400 attempted to insert IV but no avail . 1400 IV team called attepmted twice including by [place] not sucessful . Made EDr. Isaias Sakai aware

## 2013-07-20 NOTE — Progress Notes (Signed)
[facility] PROGRESS NOTE Interim summary 73 y.o. 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 She has been admitted multiple times to [facility] for volume overload. During her stay at [facility] she would be diuresed and then was transitioned to PO lasix 20 mg for home. Daughter and patient report she can't take more than 20 mg daily of lasix because she urinates on the floor because she can't get to the bathroom in time. Of note she was transfused with 4 PRBC at [facility] within the past month and upper and lower endo was negative for acute bleeding.  Pertinent labs on admission Hemoglobin 8.7 creatinine 0.75, Pro BNP 1673 and Trop WNL. Started on lasix gtt with metolazone and eventually transition to IV lasix 80 mg q8hrs (to quantify PO regimen at discharge). Weight down 12 lbs since admission. Creatinine stablel 0.85.  ECHO 07/10/13: EF 55%, mild AI and valve area 0.88, moderate MS (mean = 8) with severe mitral annular calcification, LA mod dilated and RV mildly dilated, moderate TR  Still fluid overload and heart failure team has been consulted; plan is for more diuresis and to restart lasix drip.  Filed Weights   07/18/13 0622 07/19/13 0637 07/20/13 0537  Weight: 92.625 kg (204 lb 3.2 oz) 91.2 kg (201 lb 1 oz) 89.9 kg (198 lb 3.1 oz)        Intake/Output Summary (Last 24 hours) at 07/20/13 1141 Last data filed at 07/20/13 0856  Gross per 24 hour  Intake   1080 ml  Output   3425 ml  Net  -2345 ml     Assessment/Plan:  Acute respiratory failure due to acute on chronic diastolic HF: - BP now soft and patient still with signs of been fluid overload; will continue lasix ggt and metolazone, Ct stable. - Advance heart failure team managing diuretics. Good diuresis with current regimen. -  Appreciate assistance from heart failure team and will follow further rec's - Continue daily weight, fluid restriction, low sodium diet. - Continue unna boots - patient with improved in diuresis; still with significant fluid overload   Essential HTN: -cont home antihypertensive meds. -BP soft but stable  Anemia: -no signs of overt bleeding -continue ferrous sulfate -Hgb stable  Neuropathy: -continue gabapentin  Hypothyroidism: -TSH 7.3 -synthroid adjusted to 23mcg during this admission -will need repeat thyroid function test in 4 weeks  Mitral stenosis: -patient will like to follow and discussed with cardiology future treatments -currently plan is for medication management   Code Status: full Family Communication: none  Disposition Plan: remains inpatient   Consultants:  Heart failure team  Procedures: ECHO: - Left ventricle: The cavity size was normal. Wall thickness was normal. The estimated ejection fraction was 55%. - Aortic valve: There was mild stenosis. Valve area (Vmax): 0.88 cm^2. - Mitral valve: There is severe mitral annular calcificatin causing moderate stenosis across the mitral oriface There was mild regurgitation. Valve area by continuity equation (using LVOT flow): 0.92 cm^2. - Left atrium: The atrium was moderately dilated. - Right ventricle: The cavity size was mildly dilated. - Right atrium: The atrium was mildly dilated. - Tricuspid valve: There was moderate regurgitation.   LE duplex: no DVT, baker cyst or SVT.  Antibiotics:  none (indicate start date, and stop date if known)  HPI/Subjective:.  No SOB. Patient with improved diuresis, but still fluid  overload  Objective: Filed Vitals:   07/19/13 1608 07/19/13 2106 07/20/13 0537 07/20/13 1017  BP: 117/47 108/51 94/44 101/38  Pulse: 87 90 86 93  Temp: 97.5 F (36.4 C) 97.6 F (36.4 C) 98.1 F (36.7 C)   TempSrc: Oral Oral Oral   Resp: 18 18 19 18   Height:      Weight:   89.9 kg (198 lb  3.1 oz)   SpO2: 94% 96% 92% 97%     Exam:  General: in no acute distress; reports breathing is at baseline and feel swelling continue improving.  HEENT: No bruits, no goiter. Mild JVD Heart: Regular rate and rhythm, 2++ edema, she is also edematous  Thigh. Lungs: no wheezing, no frank crackles auscultated; good air movement  Abdomen: Soft, nontender, nondistended, positive bowel sounds.   Data Reviewed: Basic Metabolic Panel:  Recent Labs Lab 07/16/13 0359 07/17/13 0345 07/18/13 1010 07/19/13 0552 07/20/13 0600  NA 133* 130* 136* 133* 134*  K 3.2* 2.8* 3.8 4.3 4.9  CL 90* 86* 90* 88* 90*  CO2 28 29 33* 29 30  GLUCOSE 83 84 133* 86 90  BUN 18 18 17 18 21   CREATININE 0.85 0.77 0.83 0.93 0.98  CALCIUM 8.7 8.6 9.0 8.7 8.9   BNP (last 3 results)  Recent Labs  07/09/13 1602  PROBNP 1673.0*    Studies: No results found.  Scheduled Meds: . ferrous sulfate  325 mg Oral Q breakfast  . gabapentin  300 mg Oral QHS  . heparin  5,000 Units Subcutaneous 3 times per day  . lactose free nutrition  237 mL Oral BID BM  . levothyroxine  200 mcg Oral QAC breakfast  . metolazone  5 mg Oral Daily  . polyethylene glycol  17 g Oral BID  . potassium chloride  40 mEq Oral TID  . sodium chloride  3 mL Intravenous Q12H  . spironolactone  25 mg Oral Daily   Continuous Infusions: . furosemide (LASIX) infusion 10 mg/hr (07/19/13 1128)   Time > 30 minutes  Charlynne Cousins  [facility] Pager [phone] If 8PM-8AM, please contact night-coverage at [email or web address], password [facility] 07/20/2013, 11:41 AM  LOS: 11 days     **Disclaimer: This note may have been dictated with voice recognition software. Similar sounding words can inadvertently be transcribed and this note may contain transcription errors which may not have been corrected upon publication of note.**

## 2013-07-20 NOTE — Progress Notes (Signed)
I, Janeann Merl, cosign Darlin Drop' assessments, med administration, I's & O's, vital signs, etc.

## 2013-07-20 NOTE — Progress Notes (Signed)
CARDIAC REHAB PHASE I   PRE:  Rate/Rhythm: 90 regular (non tele pt)  BP:  Sitting: 98/44      SaO2: 96 RA  MODE:  Ambulation: 360 ft   POST:  Rate/Rhythm: 97 regular  BP:  Sitting: 119/46     SaO2: 99 0930-1000 Patient ambulated in hallway with RW x 1 assist to manage IV pole. Steady gait noted. Patient states she uses a walker at home but hopes to go back to a cane after hospitalization and fluid removal. Patient denies complaints of SOB during ambulation. No rest breaks taken. Reinforced CHF education with teach back noted. Patient stated she has read thru heart failure packet, has scales at home, understands the importance of following a low sodium diet. Post walk patient requested to sit at bedside with phone and call bell in reach.  Santina Evans, BSN 07/20/2013 10:04 AM

## 2013-07-20 NOTE — Progress Notes (Signed)
Peripherally Inserted Central Catheter/Midline Placement  The IV Nurse has discussed with the patient and/or persons authorized to consent for the patient, the purpose of this procedure and the potential benefits and risks involved with this procedure.  The benefits include less needle sticks, lab draws from the catheter and patient may be discharged home with the catheter.  Risks include, but not limited to, infection, bleeding, blood clot (thrombus formation), and puncture of an artery; nerve damage and irregular heat beat.  Alternatives to this procedure were also discussed.  PICC/Midline Placement Documentation        Pearson Picou, Nicolette Bang 07/20/2013, 9:01 PM

## 2013-07-21 DIAGNOSIS — I5033 Acute on chronic diastolic (congestive) heart failure: Secondary | ICD-10-CM

## 2013-07-21 LAB — CBC
HCT: 26.8 % — ABNORMAL LOW (ref 36.0–46.0)
HEMOGLOBIN: 8.5 g/dL — AB (ref 12.0–15.0)
MCH: 27.9 pg (ref 26.0–34.0)
MCHC: 31.7 g/dL (ref 30.0–36.0)
MCV: 87.9 fL (ref 78.0–100.0)
Platelets: 158 10*3/uL (ref 150–400)
RBC: 3.05 MIL/uL — ABNORMAL LOW (ref 3.87–5.11)
RDW: 20.6 % — ABNORMAL HIGH (ref 11.5–15.5)
WBC: 6.8 10*3/uL (ref 4.0–10.5)

## 2013-07-21 LAB — BASIC METABOLIC PANEL
BUN: 22 mg/dL (ref 6–23)
CO2: 30 mEq/L (ref 19–32)
Calcium: 8.6 mg/dL (ref 8.4–10.5)
Chloride: 87 mEq/L — ABNORMAL LOW (ref 96–112)
Creatinine, Ser: 0.88 mg/dL (ref 0.50–1.10)
GFR calc Af Amer: 74 mL/min — ABNORMAL LOW (ref >90)
GFR calc non Af Amer: 64 mL/min — ABNORMAL LOW (ref >90)
GLUCOSE: 150 mg/dL — AB (ref 70–99)
POTASSIUM: 3.8 meq/L (ref 3.7–5.3)
Sodium: 130 mEq/L — ABNORMAL LOW (ref 137–147)

## 2013-07-21 NOTE — Progress Notes (Signed)
[facility] PROGRESS NOTE Interim summary 73 y.o. 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 She has been admitted multiple times to [facility] for volume overload. During her stay at [facility] she would be diuresed and then was transitioned to PO lasix 20 mg for home. Daughter and patient report she can't take more than 20 mg daily of lasix because she urinates on the floor because she can't get to the bathroom in time. Of note she was transfused with 4 PRBC at [facility] within the past month and upper and lower endo was negative for acute bleeding.  Pertinent labs on admission Hemoglobin 8.7 creatinine 0.75, Pro BNP 1673 and Trop WNL. Started on lasix gtt with metolazone and eventually transition to IV lasix 80 mg q8hrs (to quantify PO regimen at discharge). Weight down 12 lbs since admission. Creatinine stablel 0.85.  ECHO 07/10/13: EF 55%, mild AI and valve area 0.88, moderate MS (mean = 8) with severe mitral annular calcification, LA mod dilated and RV mildly dilated, moderate TR  Still fluid overload and heart failure team has been consulted; plan is for more diuresis and to restart lasix drip.  Filed Weights   07/19/13 0637 07/20/13 0537 07/21/13 0539  Weight: 91.2 kg (201 lb 1 oz) 89.9 kg (198 lb 3.1 oz) 89.721 kg (197 lb 12.8 oz)        Intake/Output Summary (Last 24 hours) at 07/21/13 1005 Last data filed at 07/21/13 0700  Gross per 24 hour  Intake    825 ml  Output   3350 ml  Net  -2525 ml     Assessment/Plan:  Acute respiratory failure due to acute on chronic diastolic HF: - BP soft, will continue lasix ggt and metolazone, Ct stable. - Advance heart failure team managing diuretics. Good diuresis with current regimen. Weight is trending down. - Appreciate assistance from heart  failure team and will follow further rec's - Continue daily weight, fluid restriction, low sodium diet. - Continue unna boots - patient with improved in diuresis; still with significant fluid overload   Essential HTN: -cont home antihypertensive meds. -BP soft but stable  Anemia: -no signs of overt bleeding -continue ferrous sulfate -Hgb stable  Neuropathy: -continue gabapentin  Hypothyroidism: -TSH 7.3 -synthroid adjusted to 265mcg during this admission -will need repeat thyroid function test in 4 weeks  Mitral stenosis: -patient will like to follow and discussed with cardiology future treatments -currently plan is for medication management   Code Status: full Family Communication: none  Disposition Plan: remains inpatient   Consultants:  Heart failure team  Procedures: ECHO: - Left ventricle: The cavity size was normal. Wall thickness was normal. The estimated ejection fraction was 55%. - Aortic valve: There was mild stenosis. Valve area (Vmax): 0.88 cm^2. - Mitral valve: There is severe mitral annular calcificatin causing moderate stenosis across the mitral oriface There was mild regurgitation. Valve area by continuity equation (using LVOT flow): 0.92 cm^2. - Left atrium: The atrium was moderately dilated. - Right ventricle: The cavity size was mildly dilated. - Right atrium: The atrium was mildly dilated. - Tricuspid valve: There was moderate regurgitation.   LE duplex: no DVT, baker cyst or SVT.  Antibiotics:  none (indicate start date, and stop date if known)  HPI/Subjective:.  No SOB. Patient with improved diuresis.  Objective: Filed Vitals:   07/21/13 3500  07/21/13 0543 07/21/13 0553 07/21/13 0700  BP: 100/49 99/52 104/58 89/45  Pulse: 85 80 78 84  Temp: 98 F (36.7 C) 98 F (36.7 C) 98.5 F (36.9 C) 98.2 F (36.8 C)  TempSrc: Axillary Oral Oral Oral  Resp: 18 20 20 22   Height:      Weight: 89.721 kg (197 lb 12.8 oz)     SpO2: 98% 98% 96% 98%       Exam:  General: in no acute distress; reports breathing is at baseline and feel swelling continue improving.  HEENT: No bruits, no goiter. Mild JVD Heart: Regular rate and rhythm, 2++ edema, she is also edematous  Thigh. Lungs: no wheezing, no frank crackles auscultated; good air movement  Abdomen: Soft, nontender, nondistended, positive bowel sounds.   Data Reviewed: Basic Metabolic Panel:  Recent Labs Lab 07/17/13 0345 07/18/13 1010 07/19/13 0552 07/20/13 0600 07/21/13 0840  NA 130* 136* 133* 134* 130*  K 2.8* 3.8 4.3 4.9 3.8  CL 86* 90* 88* 90* 87*  CO2 29 33* 29 30 30   GLUCOSE 84 133* 86 90 150*  BUN 18 17 18 21 22   CREATININE 0.77 0.83 0.93 0.98 0.88  CALCIUM 8.6 9.0 8.7 8.9 8.6   BNP (last 3 results)  Recent Labs  07/09/13 1602  PROBNP 1673.0*    Studies: No results found.  Scheduled Meds: . ferrous sulfate  325 mg Oral Q breakfast  . gabapentin  300 mg Oral QHS  . heparin  5,000 Units Subcutaneous 3 times per day  . lactose free nutrition  237 mL Oral BID BM  . levothyroxine  200 mcg Oral QAC breakfast  . metolazone  5 mg Oral Daily  . polyethylene glycol  17 g Oral BID  . potassium chloride  40 mEq Oral BID  . sodium chloride  3 mL Intravenous Q12H  . spironolactone  25 mg Oral Daily   Continuous Infusions: . furosemide (LASIX) infusion 10 mg/hr (07/20/13 1722)   Time > 30 minutes  Charlynne Cousins  [facility] Pager [phone] If 8PM-8AM, please contact night-coverage at [email or web address], password [facility] 07/21/2013, 10:05 AM  LOS: 12 days     **Disclaimer: This note may have been dictated with voice recognition software. Similar sounding words can inadvertently be transcribed and this note may contain transcription errors which may not have been corrected upon publication of note.**

## 2013-07-21 NOTE — Evaluation (Signed)
Physical Therapy Evaluation Patient Details Name: Nicole Hansen MRN: [id number] DOB: January 28, 1941 Today's Date: 07/21/2013   History of Present Illness  Patient is a 73 yo female admited 07/09/13 with respiratory distress, edema.  Patient with CHF.  PMH: Aortic valve replacement, HTN, obesity, CHF, neuropathy, unna boots bil.  Clinical Impression  Patient at mod I level with mobility and supervision for ambulation.  Able to ambulate 32' with RW safely with supervision.  Good balance with RW.  No acute PT needs identified - PT will sign off.  Encouraged continued ambulation in hallway with Cardiac Rehab and nursing.    Follow Up Recommendations No PT follow up;Supervision - Intermittent    Equipment Recommendations  None recommended by PT    Recommendations for Other Services       Precautions / Restrictions Precautions Precautions: None Restrictions Weight Bearing Restrictions: No      Mobility  Bed Mobility Overal bed mobility: Independent                Transfers Overall transfer level: Modified independent Equipment used: Rolling walker (2 wheeled)             General transfer comment: Uses safe technique.  Ambulation/Gait Ambulation/Gait assistance: Supervision Ambulation Distance (Feet): 380 Feet Assistive device: Rolling walker (2 wheeled) Gait Pattern/deviations: Step-through pattern;Decreased stride length Gait velocity: Decreased Gait velocity interpretation: Below normal speed for age/gender General Gait Details: Patient demonstrates safe use of RW.  Good balance with RW.    Stairs            Wheelchair Mobility    Modified Rankin (Stroke Patients Only)       Balance                                             Pertinent Vitals/Pain     Home Living Family/patient expects to be discharged to:: Private residence Living Arrangements: Spouse/significant other;Children Available Help at Discharge: Family;Available 24  hours/day Type of Home: House Home Access: Stairs to enter Entrance Stairs-Rails: Right Entrance Stairs-Number of Steps: 3 Home Layout: One level Home Equipment: Walker - 2 wheels;Cane - single point;Bedside commode      Prior Function Level of Independence: Independent with assistive device(s);Needs assistance   Gait / Transfers Assistance Needed: Uses RW/cane for ambulation.  ADL's / Homemaking Assistance Needed: Daughter assists patient with bath - difficulty getting feet into tub when "swollen"        Hand Dominance        Extremity/Trunk Assessment   Upper Extremity Assessment: Overall WFL for tasks assessed           Lower Extremity Assessment: Generalized weakness (Edema bil. LE's; h/o neuropathy)         Communication   Communication: No difficulties  Cognition Arousal/Alertness: Awake/alert Behavior During Therapy: WFL for tasks assessed/performed Overall Cognitive Status: Within Functional Limits for tasks assessed                      General Comments      Exercises        Assessment/Plan    PT Assessment Patent does not need any further PT services  PT Diagnosis     PT Problem List    PT Treatment Interventions     PT Goals (Current goals can be found in the Care Plan section) Acute Rehab PT  Goals PT Goal Formulation: No goals set, d/c therapy    Frequency     Barriers to discharge        Co-evaluation               End of Session Equipment Utilized During Treatment: Gait belt Activity Tolerance: Patient tolerated treatment well Patient left: in bed;with call bell/phone within reach Nurse Communication: Mobility status (Encouraged ambulation in hallway with nursing)         Time: 3570-1779 PT Time Calculation (min): 25 min   Charges:   PT Evaluation $Initial PT Evaluation Tier I: 1 Procedure PT Treatments $Gait Training: 23-37 mins   PT G Codes:          Despina Pole 07/21/2013, 10:57 AM Carita Pian.  Sanjuana Kava, [place] Pager [phone]

## 2013-07-21 NOTE — Progress Notes (Signed)
Patient ID: Nicole Hansen, female   DOB: 11/11/1940, 73 y.o.   MRN: [id number]    SUBJECTIVE:  Patient continues to diuresis. She is on significant doses of diuretics. Renal function remained stable. She still has anasarca.   Filed Vitals:   07/21/13 0539 07/21/13 0543 07/21/13 0553 07/21/13 0700  BP: 100/49 99/52 104/58 89/45  Pulse: 85 80 78 84  Temp: 98 F (36.7 C) 98 F (36.7 C) 98.5 F (36.9 C) 98.2 F (36.8 C)  TempSrc: Axillary Oral Oral Oral  Resp: 18 20 20 22   Height:      Weight: 197 lb 12.8 oz (89.721 kg)     SpO2: 98% 98% 96% 98%     Intake/Output Summary (Last 24 hours) at 07/21/13 1025 Last data filed at 07/21/13 0700  Gross per 24 hour  Intake    825 ml  Output   3350 ml  Net  -2525 ml    LABS: Basic Metabolic Panel:  Recent Labs  07/20/13 0600 07/21/13 0840  NA 134* 130*  K 4.9 3.8  CL 90* 87*  CO2 30 30  GLUCOSE 90 150*  BUN 21 22  CREATININE 0.98 0.88  CALCIUM 8.9 8.6   Liver Function Tests: No results found for this basename: AST, ALT, ALKPHOS, BILITOT, PROT, ALBUMIN,  in the last 72 hours No results found for this basename: LIPASE, AMYLASE,  in the last 72 hours CBC:  Recent Labs  07/20/13 0600 07/21/13 0840  WBC 6.6 6.8  HGB 7.1* 8.5*  HCT 23.0* 26.8*  MCV 89.5 87.9  PLT 146* 158   Cardiac Enzymes: No results found for this basename: CKTOTAL, CKMB, CKMBINDEX, TROPONINI,  in the last 72 hours BNP: No components found with this basename: POCBNP,  D-Dimer: No results found for this basename: DDIMER,  in the last 72 hours Hemoglobin A1C: No results found for this basename: HGBA1C,  in the last 72 hours Fasting Lipid Panel: No results found for this basename: CHOL, HDL, LDLCALC, TRIG, CHOLHDL, LDLDIRECT,  in the last 72 hours Thyroid Function Tests: No results found for this basename: TSH, T4TOTAL, FREET3, T3FREE, THYROIDAB,  in the last 72 hours  RADIOLOGY: Dg Chest 2 View  07/09/2013   CLINICAL DATA:  Short of breath,  increasing lower extremity swelling  EXAM: CHEST  2 VIEW  COMPARISON:  Prior chest x-ray 06/25/2013  FINDINGS: Stable cardiomegaly and mediastinal contours. Atherosclerotic calcifications present within the transverse aorta. Patient is status post median sternotomy with evidence of prior CABG and aortic valve replacement. Pulmonary vascular congestion with mild pulmonary edema but no significant interval progression compared to prior. Small bilateral right larger than left layering pleural effusions with associated bibasilar atelectasis. No acute osseous abnormality.  IMPRESSION: Similar appearance of mild compared to 06/25/2013.  Small right larger than left pleural effusions.   Electronically Signed   By: Jacqulynn Cadet M.D.   On: 07/09/2013 17:42   Ct Angio Chest Pe W/cm &/or Wo Cm  07/10/2013   CLINICAL DATA:  Dyspnea.  Elevated D-dimer.  EXAM: CT ANGIOGRAPHY CHEST WITH CONTRAST  TECHNIQUE: Multidetector CT imaging of the chest was performed using the standard protocol during bolus administration of intravenous contrast. Multiplanar CT image reconstructions and MIPs were obtained to evaluate the vascular anatomy.  CONTRAST:  33mL OMNIPAQUE IOHEXOL 350 MG/ML SOLN  COMPARISON:  05/12/2008  FINDINGS: THORACIC INLET/BODY WALL:  Diffuse skin and subcutaneous edema.  MEDIASTINUM:  Cardiomegaly. No pericardial effusion. Patient is status post aortic valve replacement. There is  bulky mitral annular calcification. Atherosclerosis, status post CABG. No adenopathy.  No evidence of pulmonary embolism. There is repeated respiratory motion which decreases sensitivity in evaluating the post segmental pulmonary arterial tree.  LUNG WINDOWS:  Moderate volume bilateral water density pleural effusions. There is some scalloping of the lung margins, but overall the fluid is simple appearing. There is dependent ground-glass density in the upper lungs, likely atelectasis. There is a small area of airspace disease in the lingula.  Extensive lower lobe atelectasis. There is centrilobular emphysema at the apices, better seen on the previous examination.  UPPER ABDOMEN:  Lobulated liver contour and probable splenomegaly, findings which suggest cirrhosis.  OSSEOUS:  No acute fracture.  No suspicious lytic or blastic lesions.  Review of the MIP images confirms the above findings.  IMPRESSION: 1. Negative for pulmonary embolism to the proximal segmental level. 2. Moderate bilateral pleural effusions with extensive atelectasis. 3. Possible early pneumonia in the lingula. 4. Probable cirrhosis.   Electronically Signed   By: Jorje Guild M.D.   On: 07/10/2013 03:51    PHYSICAL EXAM   the patient is comfortable in bed. Lungs reveal scattered rhonchi. Abdomen is soft. She has edema up to her groin.   TELEMETRY:  The patient is not currently on telemetry.   ASSESSMENT AND PLAN:    Acute on chronic diastolic CHF (congestive heart failure)     Patient continues to diuresis. Renal function continues to be stable. The plan is to continue the current diuretics until we've a close to dry weight or we see change in renal function.   Dola Argyle 07/21/2013 10:25 AM

## 2013-07-22 LAB — BASIC METABOLIC PANEL
BUN: 21 mg/dL (ref 6–23)
CALCIUM: 8.6 mg/dL (ref 8.4–10.5)
CHLORIDE: 87 meq/L — AB (ref 96–112)
CO2: 30 meq/L (ref 19–32)
Creatinine, Ser: 0.89 mg/dL (ref 0.50–1.10)
GFR calc Af Amer: 73 mL/min — ABNORMAL LOW (ref >90)
GFR calc non Af Amer: 63 mL/min — ABNORMAL LOW (ref >90)
Glucose, Bld: 90 mg/dL (ref 70–99)
POTASSIUM: 3.8 meq/L (ref 3.7–5.3)
SODIUM: 132 meq/L — AB (ref 137–147)

## 2013-07-22 LAB — TYPE AND SCREEN
ABO/RH(D): O POS
Antibody Screen: NEGATIVE
UNIT DIVISION: 0
Unit division: 0

## 2013-07-22 NOTE — Progress Notes (Signed)
[facility] PROGRESS NOTE Interim summary 73 y.o. 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 She has been admitted multiple times to [facility] for volume overload. During her stay at [facility] she would be diuresed and then was transitioned to PO lasix 20 mg for home. Daughter and patient report she can't take more than 20 mg daily of lasix because she urinates on the floor because she can't get to the bathroom in time. Of note she was transfused with 4 PRBC at [facility] within the past month and upper and lower endo was negative for acute bleeding.  Pertinent labs on admission Hemoglobin 8.7 creatinine 0.75, Pro BNP 1673 and Trop WNL. Started on lasix gtt with metolazone and eventually transition to IV lasix 80 mg q8hrs (to quantify PO regimen at discharge). Weight down 12 lbs since admission. Creatinine stablel 0.85.  ECHO 07/10/13: EF 55%, mild AI and valve area 0.88, moderate MS (mean = 8) with severe mitral annular calcification, LA mod dilated and RV mildly dilated, moderate TR  Still fluid overload and heart failure team has been consulted; plan is for more diuresis and to restart lasix drip.  Filed Weights   07/20/13 0537 07/21/13 0539 07/22/13 0520  Weight: 89.9 kg (198 lb 3.1 oz) 89.721 kg (197 lb 12.8 oz) 88.1 kg (194 lb 3.6 oz)        Intake/Output Summary (Last 24 hours) at 07/22/13 1039 Last data filed at 07/22/13 0955  Gross per 24 hour  Intake    790 ml  Output   4625 ml  Net  -3835 ml     Assessment/Plan:  Acute respiratory failure due to acute on chronic diastolic HF: - BP stable, continue lasix ggt and metolazone, Ct stable. - Advance heart failure team managing diuretics. Good diuresis with current regimen. Weight is trending down. - Appreciate assistance from heart  failure team and will follow further rec's - Continue daily weight, fluid restriction, low sodium diet. - Continue unna boots - patient with improved in diuresis; still with significant fluid overload   Essential HTN: - Cont home antihypertensive meds. - BP soft but stable  Anemia: - No signs of overt bleeding - Continue ferrous sulfate - Hgb stable  Neuropathy: -continue gabapentin  Hypothyroidism: -TSH 7.3 -synthroid adjusted to 221mcg during this admission -will need repeat thyroid function test in 4 weeks  Mitral stenosis: -patient will like to follow and discussed with cardiology future treatments -currently plan is for medication management   Code Status: full Family Communication: none  Disposition Plan: remains inpatient   Consultants:  Heart failure team  Procedures: ECHO: - Left ventricle: The cavity size was normal. Wall thickness was normal. The estimated ejection fraction was 55%. - Aortic valve: There was mild stenosis. Valve area (Vmax): 0.88 cm^2. - Mitral valve: There is severe mitral annular calcificatin causing moderate stenosis across the mitral oriface There was mild regurgitation. Valve area by continuity equation (using LVOT flow): 0.92 cm^2. - Left atrium: The atrium was moderately dilated. - Right ventricle: The cavity size was mildly dilated. - Right atrium: The atrium was mildly dilated. - Tricuspid valve: There was moderate regurgitation.   LE duplex: no DVT, baker cyst or SVT.  Antibiotics:  none (indicate start date, and stop date if known)  HPI/Subjective:.  No SOB.   Objective: Filed Vitals:   07/21/13  0700 07/21/13 1424 07/21/13 2204 07/22/13 0520  BP: 89/45 101/50 86/42 103/49  Pulse: 84 85 88 80  Temp: 98.2 F (36.8 C) 97.5 F (36.4 C) 97.7 F (36.5 C) 97.4 F (36.3 C)  TempSrc: Oral Oral Oral Oral  Resp: 22 20 20 20   Height:      Weight:    88.1 kg (194 lb 3.6 oz)  SpO2: 98% 96% 96% 96%    Exam: General: in no  acute distress; reports breathing is at baseline and feel swelling continue improving.  HEENT: No bruits, no goiter. Mild JVD Heart: Regular rate and rhythm, 2++ edema, she is also edematous  Thigh. Lungs: no wheezing, no frank crackles auscultated; good air movement  Abdomen: Soft, nontender, nondistended, positive bowel sounds.   Data Reviewed: Basic Metabolic Panel:  Recent Labs Lab 07/18/13 1010 07/19/13 0552 07/20/13 0600 07/21/13 0840 07/22/13 0527  NA 136* 133* 134* 130* 132*  K 3.8 4.3 4.9 3.8 3.8  CL 90* 88* 90* 87* 87*  CO2 33* 29 30 30 30   GLUCOSE 133* 86 90 150* 90  BUN 17 18 21 22 21   CREATININE 0.83 0.93 0.98 0.88 0.89  CALCIUM 9.0 8.7 8.9 8.6 8.6   BNP (last 3 results)  Recent Labs  07/09/13 1602  PROBNP 1673.0*    Studies: No results found.  Scheduled Meds: . ferrous sulfate  325 mg Oral Q breakfast  . gabapentin  300 mg Oral QHS  . heparin  5,000 Units Subcutaneous 3 times per day  . lactose free nutrition  237 mL Oral BID BM  . levothyroxine  200 mcg Oral QAC breakfast  . metolazone  5 mg Oral Daily  . polyethylene glycol  17 g Oral BID  . potassium chloride  40 mEq Oral BID  . sodium chloride  3 mL Intravenous Q12H  . spironolactone  25 mg Oral Daily   Continuous Infusions: . furosemide (LASIX) infusion 10 mg/hr (07/21/13 1602)   Time > 30 minutes  Charlynne Cousins  [facility] Pager [phone] If 8PM-8AM, please contact night-coverage at [email or web address], password [facility] 07/22/2013, 10:39 AM  LOS: 13 days     **Disclaimer: This note may have been dictated with voice recognition software. Similar sounding words can inadvertently be transcribed and this note may contain transcription errors which may not have been corrected upon publication of note.**

## 2013-07-22 NOTE — Consult Note (Signed)
WOC wound consult note Reason for Consult:Assessment for Unna's boots used to decompress LE edema and treat woody dermatitis Wound type:Vascular insufficiency without wounds Pressure Ulcer POA: No Measurement:None Wound TMB:PJPE Drainage (amount, consistency, odor)  Periwound:Intact with evidence of recent decrease in edema. Dressing procedure/placement/frequency:Placement of Unna's Boot therapy by Jari Sportsman until PCP determines compression hosiery can be ordered. [place] nursing team will not follow, but will remain available to this patient, the nursing and medical team.  Please re-consult if needed. Thanks, Maudie Flakes, MSN, RN, [place], [place], [address] [phone])

## 2013-07-22 NOTE — Progress Notes (Signed)
Patient ID: Nicole Hansen, female   DOB: [birth year], 73 y.o.   MRN: [id number]    SUBJECTIVE: Patient continues to diuresis.   Filed Vitals:   07/21/13 0700 07/21/13 1424 07/21/13 2204 07/22/13 0520  BP: 89/45 101/50 86/42 103/49  Pulse: 84 85 88 80  Temp: 98.2 F (36.8 C) 97.5 F (36.4 C) 97.7 F (36.5 C) 97.4 F (36.3 C)  TempSrc: Oral Oral Oral Oral  Resp: 22 20 20 20   Height:      Weight:    194 lb 3.6 oz (88.1 kg)  SpO2: 98% 96% 96% 96%     Intake/Output Summary (Last 24 hours) at 07/22/13 1310 Last data filed at 07/22/13 1051  Gross per 24 hour  Intake    790 ml  Output   5425 ml  Net  -4635 ml    LABS: Basic Metabolic Panel:  Recent Labs  07/21/13 0840 07/22/13 0527  NA 130* 132*  K 3.8 3.8  CL 87* 87*  CO2 30 30  GLUCOSE 150* 90  BUN 22 21  CREATININE 0.88 0.89  CALCIUM 8.6 8.6   Liver Function Tests: No results found for this basename: AST, ALT, ALKPHOS, BILITOT, PROT, ALBUMIN,  in the last 72 hours No results found for this basename: LIPASE, AMYLASE,  in the last 72 hours CBC:  Recent Labs  07/20/13 0600 07/21/13 0840  WBC 6.6 6.8  HGB 7.1* 8.5*  HCT 23.0* 26.8*  MCV 89.5 87.9  PLT 146* 158   Cardiac Enzymes: No results found for this basename: CKTOTAL, CKMB, CKMBINDEX, TROPONINI,  in the last 72 hours BNP: No components found with this basename: POCBNP,  D-Dimer: No results found for this basename: DDIMER,  in the last 72 hours Hemoglobin A1C: No results found for this basename: HGBA1C,  in the last 72 hours Fasting Lipid Panel: No results found for this basename: CHOL, HDL, LDLCALC, TRIG, CHOLHDL, LDLDIRECT,  in the last 72 hours Thyroid Function Tests: No results found for this basename: TSH, T4TOTAL, FREET3, T3FREE, THYROIDAB,  in the last 72 hours  RADIOLOGY: Dg Chest 2 View  07/09/2013   CLINICAL DATA:  Short of breath, increasing lower extremity swelling  EXAM: CHEST  2 VIEW  COMPARISON:  Prior chest x-ray 06/25/2013  FINDINGS:  Stable cardiomegaly and mediastinal contours. Atherosclerotic calcifications present within the transverse aorta. Patient is status post median sternotomy with evidence of prior CABG and aortic valve replacement. Pulmonary vascular congestion with mild pulmonary edema but no significant interval progression compared to prior. Small bilateral right larger than left layering pleural effusions with associated bibasilar atelectasis. No acute osseous abnormality.  IMPRESSION: Similar appearance of mild compared to 06/25/2013.  Small right larger than left pleural effusions.   Electronically Signed   By: Jacqulynn Cadet M.D.   On: 07/09/2013 17:42   Ct Angio Chest Pe W/cm &/or Wo Cm  07/10/2013   CLINICAL DATA:  Dyspnea.  Elevated D-dimer.  EXAM: CT ANGIOGRAPHY CHEST WITH CONTRAST  TECHNIQUE: Multidetector CT imaging of the chest was performed using the standard protocol during bolus administration of intravenous contrast. Multiplanar CT image reconstructions and MIPs were obtained to evaluate the vascular anatomy.  CONTRAST:  37mL OMNIPAQUE IOHEXOL 350 MG/ML SOLN  COMPARISON:  05/12/2008  FINDINGS: THORACIC INLET/BODY WALL:  Diffuse skin and subcutaneous edema.  MEDIASTINUM:  Cardiomegaly. No pericardial effusion. Patient is status post aortic valve replacement. There is bulky mitral annular calcification. Atherosclerosis, status post CABG. No adenopathy.  No evidence of pulmonary embolism.  There is repeated respiratory motion which decreases sensitivity in evaluating the post segmental pulmonary arterial tree.  LUNG WINDOWS:  Moderate volume bilateral water density pleural effusions. There is some scalloping of the lung margins, but overall the fluid is simple appearing. There is dependent ground-glass density in the upper lungs, likely atelectasis. There is a small area of airspace disease in the lingula. Extensive lower lobe atelectasis. There is centrilobular emphysema at the apices, better seen on the previous  examination.  UPPER ABDOMEN:  Lobulated liver contour and probable splenomegaly, findings which suggest cirrhosis.  OSSEOUS:  No acute fracture.  No suspicious lytic or blastic lesions.  Review of the MIP images confirms the above findings.  IMPRESSION: 1. Negative for pulmonary embolism to the proximal segmental level. 2. Moderate bilateral pleural effusions with extensive atelectasis. 3. Possible early pneumonia in the lingula. 4. Probable cirrhosis.   Electronically Signed   By: Jorje Guild M.D.   On: 07/10/2013 03:51    PHYSICAL EXAM   the patient still has edema above the knees.   ASSESSMENT AND PLAN:    Acute on chronic diastolic CHF (congestive heart failure)    Plan to continue diuresis watching her renal function. The heart failure team will see her again tomorrow.   Dola Argyle 07/22/2013 1:10 PM

## 2013-07-22 NOTE — Progress Notes (Signed)
Orthopedic Tech Progress Note Patient Details:  Nicole Hansen [birth year] [id number]  Ortho Devices Type of Ortho Device: Haematologist Ortho Device/Splint Location: Bilateral unna boots Ortho Device/Splint Interventions: Application   [name], [name] 07/22/2013, 1:12 PM

## 2013-07-23 LAB — BASIC METABOLIC PANEL
BUN: 23 mg/dL (ref 6–23)
CHLORIDE: 85 meq/L — AB (ref 96–112)
CO2: 32 mEq/L (ref 19–32)
Calcium: 9.1 mg/dL (ref 8.4–10.5)
Creatinine, Ser: 0.93 mg/dL (ref 0.50–1.10)
GFR calc Af Amer: 69 mL/min — ABNORMAL LOW (ref >90)
GFR, EST NON AFRICAN AMERICAN: 60 mL/min — AB (ref >90)
GLUCOSE: 91 mg/dL (ref 70–99)
Potassium: 3.6 mEq/L — ABNORMAL LOW (ref 3.7–5.3)
SODIUM: 131 meq/L — AB (ref 137–147)

## 2013-07-23 MED ORDER — POTASSIUM CHLORIDE CRYS ER 20 MEQ PO TBCR
40.0000 meq | EXTENDED_RELEASE_TABLET | Freq: Once | ORAL | Status: AC
  Administered 2013-07-23: 40 meq via ORAL

## 2013-07-23 NOTE — Progress Notes (Signed)
[facility] PROGRESS NOTE Interim summary 73 y.o. 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 She has been admitted multiple times to [facility] for volume overload. During her stay at [facility] she would be diuresed and then was transitioned to PO lasix 20 mg for home. Daughter and patient report she can't take more than 20 mg daily of lasix because she urinates on the floor because she can't get to the bathroom in time. Of note she was transfused with 4 PRBC at [facility] within the past month and upper and lower endo was negative for acute bleeding.  Pertinent labs on admission Hemoglobin 8.7 creatinine 0.75, Pro BNP 1673 and Trop WNL. Started on lasix gtt with metolazone and eventually transition to IV lasix 80 mg q8hrs (to quantify PO regimen at discharge). Weight down 12 lbs since admission. Creatinine stablel 0.85.  ECHO 07/10/13: EF 55%, mild AI and valve area 0.88, moderate MS (mean = 8) with severe mitral annular calcification, LA mod dilated and RV mildly dilated, moderate TR  Still fluid overload and heart failure team has been consulted; plan is for more diuresis and to restart lasix drip.  Filed Weights   07/21/13 0539 07/22/13 0520 07/23/13 0532  Weight: 89.721 kg (197 lb 12.8 oz) 88.1 kg (194 lb 3.6 oz) 83.1 kg (183 lb 3.2 oz)        Intake/Output Summary (Last 24 hours) at 07/23/13 1123 Last data filed at 07/23/13 0932  Gross per 24 hour  Intake 2757.6 ml  Output   7051 ml  Net -4293.4 ml     Assessment/Plan:  Acute respiratory failure due to acute on chronic diastolic HF: - BP stable, continue lasix ggt and metolazone, Ct stable. - Good diuresis with current regimen. Weight is trending down. - Appreciate assistance from heart failure team and will follow further rec's -  Continue daily weight, fluid restriction, low sodium diet. - Continue unna boots - still with significant fluid overload   Essential HTN: - Cont home antihypertensive meds. - BP soft but stable  Anemia: - No signs of overt bleeding - Continue ferrous sulfate - Hgb stable  Neuropathy: -continue gabapentin  Hypothyroidism: -TSH 7.3 -synthroid adjusted to 263mcg during this admission -will need repeat thyroid function test in 4 weeks  Mitral stenosis: -patient will like to follow and discussed with cardiology future treatments -currently plan is for medication management   Code Status: full Family Communication: none  Disposition Plan: remains inpatient   Consultants:  Heart failure team  Procedures: ECHO: - Left ventricle: The cavity size was normal. Wall thickness was normal. The estimated ejection fraction was 55%. - Aortic valve: There was mild stenosis. Valve area (Vmax): 0.88 cm^2. - Mitral valve: There is severe mitral annular calcificatin causing moderate stenosis across the mitral oriface There was mild regurgitation. Valve area by continuity equation (using LVOT flow): 0.92 cm^2. - Left atrium: The atrium was moderately dilated. - Right ventricle: The cavity size was mildly dilated. - Right atrium: The atrium was mildly dilated. - Tricuspid valve: There was moderate regurgitation.   LE duplex: no DVT, baker cyst or SVT.  Antibiotics:  none (indicate start date, and stop date if known)  HPI/Subjective:.  No SOB.  Wants to go home this week.  Objective: Filed Vitals:   07/22/13 1432 07/22/13 2042 07/23/13 0532 07/23/13 0938  BP:  103/52 91/45 97/43  102/51  Pulse: 86 91 85 86  Temp: 98 F (36.7 C) 98.1 F (36.7 C) 97.9 F (36.6 C) 98.2 F (36.8 C)  TempSrc: Oral Oral Oral Oral  Resp: 20 19 18 18   Height:      Weight:   83.1 kg (183 lb 3.2 oz)   SpO2: 94% 94% 94% 96%    Exam: General: in no acute distress; reports breathing is at baseline and  feel swelling continue improving.  HEENT: No bruits, no goiter. Mild JVD Heart: Regular rate and rhythm, 2++ edema, she is also edematous  Thigh. Lungs: no wheezing, no frank crackles auscultated; good air movement  Abdomen: Soft, nontender, nondistended, positive bowel sounds.   Data Reviewed: Basic Metabolic Panel:  Recent Labs Lab 07/19/13 0552 07/20/13 0600 07/21/13 0840 07/22/13 0527 07/23/13 0420  NA 133* 134* 130* 132* 131*  K 4.3 4.9 3.8 3.8 3.6*  CL 88* 90* 87* 87* 85*  CO2 29 30 30 30  32  GLUCOSE 86 90 150* 90 91  BUN 18 21 22 21 23   CREATININE 0.93 0.98 0.88 0.89 0.93  CALCIUM 8.7 8.9 8.6 8.6 9.1   BNP (last 3 results)  Recent Labs  07/09/13 1602  PROBNP 1673.0*    Studies: No results found.  Scheduled Meds: . ferrous sulfate  325 mg Oral Q breakfast  . gabapentin  300 mg Oral QHS  . heparin  5,000 Units Subcutaneous 3 times per day  . lactose free nutrition  237 mL Oral BID BM  . levothyroxine  200 mcg Oral QAC breakfast  . metolazone  5 mg Oral Daily  . polyethylene glycol  17 g Oral BID  . potassium chloride  40 mEq Oral BID  . potassium chloride  40 mEq Oral Once  . sodium chloride  3 mL Intravenous Q12H  . spironolactone  25 mg Oral Daily   Continuous Infusions: . furosemide (LASIX) infusion 10 mg/hr (07/22/13 2309)   Time > 30 minutes  Charlynne Cousins  [facility] Pager [phone] If 8PM-8AM, please contact night-coverage at [email or web address], password [facility] 07/23/2013, 11:23 AM  LOS: 14 days     **Disclaimer: This note may have been dictated with voice recognition software. Similar sounding words can inadvertently be transcribed and this note may contain transcription errors which may not have been corrected upon publication of note.**

## 2013-07-23 NOTE — Progress Notes (Signed)
Advanced Heart Failure Rounding Note  Primary Physician: Rubie Maid, MD  Primary Cardiologist: Dr. Wyline Copas  Subjective:    Nicole Hansen is a 73 yo female with a history of AI s/p AVR (bovine), hypothyroidism s/p thyroidectomy, HTN, obesity and diastolic HF.   She has been admitted multiple times to [facility] for volume overload. During her stay at [facility] she would be diuresed and then was transitioned to PO lasix 20 mg for home. Daughter and patient report she can't take more than 20 mg daily of lasix because she urinates on the floor because she can't get to the bathroom in time. Of note she was transfused with 4 PRBC at [facility] within the past month and upper and lower endo was negative for bleeding.   ECHO 07/10/13: EF 55%, mild AI and valve area 0.88, moderate Nicole (mean = 8) with severe mitral annular calcification, LA mod dilated and RV mildly dilated, moderate TR   Admitted with volume overload. Diuresed with lasix drip and metolazone.  Overall she has diuresed 32 pounds.  Denies SOB and orthopnea.      Objective:   Weight Range:  Vital Signs:   Temp:  [97.9 F (36.6 C)-98.2 F (36.8 C)] 98.2 F (36.8 C) (06/15 0938) Pulse Rate:  [85-91] 86 (06/15 0938) Resp:  [18-20] 18 (06/15 0938) BP: (91-103)/(43-52) 102/51 mmHg (06/15 0938) SpO2:  [94 %-96 %] 96 % (06/15 0938) Weight:  [183 lb 3.2 oz (83.1 kg)] 183 lb 3.2 oz (83.1 kg) (06/15 0532) Last BM Date: 07/22/13  Weight change: Filed Weights   07/21/13 0539 07/22/13 0520 07/23/13 0532  Weight: 197 lb 12.8 oz (89.721 kg) 194 lb 3.6 oz (88.1 kg) 183 lb 3.2 oz (83.1 kg)    Intake/Output:   Intake/Output Summary (Last 24 hours) at 07/23/13 1115 Last data filed at 07/23/13 0932  Gross per 24 hour  Intake 2757.6 ml  Output   7051 ml  Net -4293.4 ml     Physical Exam: General: Well appearing. No resp difficulty, lying in bed HEENT: normal  Neck: supple. JVP 8 Carotids 2+ bilat; no bruits. No lymphadenopathy or  thryomegaly appreciated.  Cor: PMI nondisplaced. Regular rate & rhythm. No rubs or gallops, 2/6 AS and MR  Lungs: clear  Abdomen: tight, nontender, +distended. No hepatosplenomegaly. No bruits or masses. Good bowel sounds.  Extremities: no cyanosis, clubbing, rash, R and LLE with unna boot. 1-2+ thigh edema.  Neuro: alert & orientedx3, cranial nerves grossly intact. moves all 4 extremities w/o difficulty. Affect pleasant  Telemetry: SR  Labs: Basic Metabolic Panel:  Recent Labs Lab 07/19/13 0552 07/20/13 0600 07/21/13 0840 07/22/13 0527 07/23/13 0420  NA 133* 134* 130* 132* 131*  K 4.3 4.9 3.8 3.8 3.6*  CL 88* 90* 87* 87* 85*  CO2 29 30 30 30  32  GLUCOSE 86 90 150* 90 91  BUN 18 21 22 21 23   CREATININE 0.93 0.98 0.88 0.89 0.93  CALCIUM 8.7 8.9 8.6 8.6 9.1    Liver Function Tests: No results found for this basename: AST, ALT, ALKPHOS, BILITOT, PROT, ALBUMIN,  in the last 168 hours No results found for this basename: LIPASE, AMYLASE,  in the last 168 hours No results found for this basename: AMMONIA,  in the last 168 hours  CBC:  Recent Labs Lab 07/20/13 0600 07/21/13 0840  WBC 6.6 6.8  HGB 7.1* 8.5*  HCT 23.0* 26.8*  MCV 89.5 87.9  PLT 146* 158    Cardiac Enzymes: No results  found for this basename: CKTOTAL, CKMB, CKMBINDEX, TROPONINI,  in the last 168 hours  BNP: BNP (last 3 results)  Recent Labs  07/09/13 1602  PROBNP 1673.0*     Other results:    Imaging: No results found.   Medications:     Scheduled Medications: . ferrous sulfate  325 mg Oral Q breakfast  . gabapentin  300 mg Oral QHS  . heparin  5,000 Units Subcutaneous 3 times per day  . lactose free nutrition  237 mL Oral BID BM  . levothyroxine  200 mcg Oral QAC breakfast  . metolazone  5 mg Oral Daily  . polyethylene glycol  17 g Oral BID  . potassium chloride  40 mEq Oral BID  . sodium chloride  3 mL Intravenous Q12H  . spironolactone  25 mg Oral Daily    Infusions: .  furosemide (LASIX) infusion 10 mg/hr (07/22/13 2309)    PRN Medications: sodium chloride, acetaminophen, alum & mag hydroxide-simeth, famotidine, guaiFENesin-dextromethorphan, ondansetron, sodium chloride, sodium chloride, traMADol   Assessment:   1) A/C diastolic HF  2) HTN 3) ?OSA/OHS  4) AI s/p AVR  5) Obese  6) hypothyroidism s/p thyroidectomy  7) Hypokalemia  Plan/Discussion:    Nicole Hansen is a 73 yo female who has been admitted multiple times for diastolic HF at [facility].   Volume status improving. Brisk diuresis over the last 24-48 hours. Continue lasix drip 10 mg per hour and metolazone 5 mg daily. Plan to transition to torsemide after she is fully diuresed. Renal function stable.   Consult cardiac rehab.   Length of Stay: 14  CLEGG,AMY NP-C 07/23/2013, 11:15 AM  Advanced Heart Failure Team Pager [phone] (M-F; [place])  Please contact [facility] for night-coverage after hours (4p -7a ) and weekends on [email or web address]  Patient seen and examined with Darrick Grinder, NP. We discussed all aspects of the encounter. I agree with the assessment and plan as stated above.   Volume status much improved but still with fluid overload. Renal function stable. Continue current regimen.   Daniel Bensimhon,MD 12:58 PM

## 2013-07-24 LAB — BASIC METABOLIC PANEL
BUN: 27 mg/dL — ABNORMAL HIGH (ref 6–23)
CO2: 32 meq/L (ref 19–32)
Calcium: 8.9 mg/dL (ref 8.4–10.5)
Chloride: 86 mEq/L — ABNORMAL LOW (ref 96–112)
Creatinine, Ser: 0.79 mg/dL (ref 0.50–1.10)
GFR calc non Af Amer: 81 mL/min — ABNORMAL LOW (ref >90)
Glucose, Bld: 91 mg/dL (ref 70–99)
POTASSIUM: 3.3 meq/L — AB (ref 3.7–5.3)
Sodium: 131 mEq/L — ABNORMAL LOW (ref 137–147)

## 2013-07-24 LAB — GLUCOSE, CAPILLARY: Glucose-Capillary: 131 mg/dL — ABNORMAL HIGH (ref 70–99)

## 2013-07-24 LAB — PRO B NATRIURETIC PEPTIDE: PRO B NATRI PEPTIDE: 837.4 pg/mL — AB (ref 0–125)

## 2013-07-24 MED ORDER — POTASSIUM CHLORIDE CRYS ER 20 MEQ PO TBCR
40.0000 meq | EXTENDED_RELEASE_TABLET | Freq: Two times a day (BID) | ORAL | Status: AC
  Administered 2013-07-24 (×2): 40 meq via ORAL
  Filled 2013-07-24: qty 2

## 2013-07-24 MED ORDER — POTASSIUM CHLORIDE CRYS ER 20 MEQ PO TBCR: 40.0000 meq | EXTENDED_RELEASE_TABLET | Freq: Two times a day (BID) | ORAL | Status: DC

## 2013-07-24 NOTE — Progress Notes (Signed)
[facility] PROGRESS NOTE Interim summary 73 y.o. 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 She has been admitted multiple times to [facility] for volume overload. During her stay at [facility] she would be diuresed and then was transitioned to PO lasix 20 mg for home. Daughter and patient report she can't take more than 20 mg daily of lasix because she urinates on the floor because she can't get to the bathroom in time. Of note she was transfused with 4 PRBC at [facility] within the past month and upper and lower endo was negative for acute bleeding.  Pertinent labs on admission Hemoglobin 8.7 creatinine 0.75, Pro BNP 1673 and Trop WNL. Started on lasix gtt with metolazone and eventually transition to IV lasix 80 mg q8hrs (to quantify PO regimen at discharge). Weight down 12 lbs since admission. Creatinine stablel 0.85.  ECHO 07/10/13: EF 55%, mild AI and valve area 0.88, moderate MS (mean = 8) with severe mitral annular calcification, LA mod dilated and RV mildly dilated, moderate TR  Still fluid overload and heart failure team has been consulted; plan is for more diuresis, will need to stay in the hospital to titrate diuretic orally.  Filed Weights   07/22/13 0520 07/23/13 0532 07/24/13 0517  Weight: 88.1 kg (194 lb 3.6 oz) 83.1 kg (183 lb 3.2 oz) 81.466 kg (179 lb 9.6 oz)        Intake/Output Summary (Last 24 hours) at 07/24/13 1057 Last data filed at 07/24/13 1016  Gross per 24 hour  Intake 1200.33 ml  Output   4800 ml  Net -3599.67 ml     Assessment/Plan:  Acute respiratory failure due to acute on chronic diastolic HF: - BP stable, continue lasix ggt and metolazone, Ct stable. - Good diuresis with current regimen. Weight is trending down. - Appreciate assistance from heart failure team  and will follow further rec's - Continue daily weight, fluid restriction, low sodium diet. - She is on lasix, metolazone and aldactone, currently not on a BB or ACE-I. - still with significant fluid overload   Essential HTN: - BP soft but stable  Anemia: - No signs of overt bleeding - Continue ferrous sulfate - Hgb stable  Neuropathy: -continue gabapentin  Hypothyroidism: -TSH 7.3 -synthroid adjusted to 278mcg during this admission -will need repeat thyroid function test in 4 weeks  Mitral stenosis: -patient will like to follow and discussed with cardiology future treatments -currently plan is for medication management   Code Status: full Family Communication: none  Disposition Plan: remains inpatient   Consultants:  Heart failure team  Procedures: ECHO: - Left ventricle: The cavity size was normal. Wall thickness was normal. The estimated ejection fraction was 55%. - Aortic valve: There was mild stenosis. Valve area (Vmax): 0.88 cm^2. - Mitral valve: There is severe mitral annular calcificatin causing moderate stenosis across the mitral oriface There was mild regurgitation. Valve area by continuity equation (using LVOT flow): 0.92 cm^2. - Left atrium: The atrium was moderately dilated. - Right ventricle: The cavity size was mildly dilated. - Right atrium: The atrium was mildly dilated. - Tricuspid valve: There was moderate regurgitation.   LE duplex: no DVT, baker cyst or SVT.  Antibiotics:  none (indicate start date, and stop date if known)  HPI/Subjective:.  No SOB.  Wants to go home.  Objective: Filed Vitals:  07/23/13 2119 07/24/13 0517 07/24/13 0519 07/24/13 1005  BP: 103/48  104/51 112/54  Pulse: 87  86 87  Temp: 98 F (36.7 C)  98.6 F (37 C)   TempSrc: Oral  Oral   Resp: 18  18 18   Height:      Weight:  81.466 kg (179 lb 9.6 oz)    SpO2: 94%  91% 98%    Exam: General: in no acute distress; reports breathing is at baseline and feel swelling  continue improving.  HEENT: No bruits, no goiter. Mild JVD Heart: Regular rate and rhythm, 2++ edema, she is also edematous  Thigh. Lungs: nclear with good air movement  Abdomen: Soft, nontender, nondistended, positive bowel sounds.   Data Reviewed: Basic Metabolic Panel:  Recent Labs Lab 07/20/13 0600 07/21/13 0840 07/22/13 0527 07/23/13 0420 07/24/13 0610  NA 134* 130* 132* 131* 131*  K 4.9 3.8 3.8 3.6* 3.3*  CL 90* 87* 87* 85* 86*  CO2 30 30 30  32 32  GLUCOSE 90 150* 90 91 91  BUN 21 22 21 23  27*  CREATININE 0.98 0.88 0.89 0.93 0.79  CALCIUM 8.9 8.6 8.6 9.1 8.9   BNP (last 3 results)  Recent Labs  07/09/13 1602 07/24/13 0610  PROBNP 1673.0* 837.4*    Studies: No results found.  Scheduled Meds: . ferrous sulfate  325 mg Oral Q breakfast  . gabapentin  300 mg Oral QHS  . heparin  5,000 Units Subcutaneous 3 times per day  . lactose free nutrition  237 mL Oral BID BM  . levothyroxine  200 mcg Oral QAC breakfast  . metolazone  5 mg Oral Daily  . polyethylene glycol  17 g Oral BID  . potassium chloride  40 mEq Oral BID  . potassium chloride  40 mEq Oral BID  . sodium chloride  3 mL Intravenous Q12H  . spironolactone  25 mg Oral Daily   Continuous Infusions: . furosemide (LASIX) infusion 10 mg/hr (07/24/13 0225)   Time > 30 minutes  Charlynne Cousins  [facility] Pager [phone] If 8PM-8AM, please contact night-coverage at [email or web address], password [facility] 07/24/2013, 10:57 AM  LOS: 15 days     **Disclaimer: This note may have been dictated with voice recognition software. Similar sounding words can inadvertently be transcribed and this note may contain transcription errors which may not have been corrected upon publication of note.**

## 2013-07-24 NOTE — Progress Notes (Addendum)
Advanced Heart Failure Rounding Note  Primary Physician: Rubie Maid, MD  Primary Cardiologist: Dr. Wyline Copas  Subjective:    Nicole Hansen is a 73 yo female with a history of AI s/p AVR (bovine), hypothyroidism s/p thyroidectomy, HTN, obesity and diastolic HF.   She has been admitted multiple times to [facility] for volume overload. During her stay at [facility] she would be diuresed and then was transitioned to PO lasix 20 mg for home. Daughter and patient report she can't take more than 20 mg daily of lasix because she urinates on the floor because she can't get to the bathroom in time. Of note she was transfused with 4 PRBC at [facility] within the past month and upper and lower endo was negative for bleeding.   ECHO 07/10/13: EF 55%, mild AI and valve area 0.88, moderate Nicole (mean = 8) with severe mitral annular calcification, LA mod dilated and RV mildly dilated, moderate TR   Admitted with volume overload. Diuresed with lasix drip and metolazone.  24 hour I/O -3.0 liters. Overall she has diuresed 36 pounds.  Denies SOB and orthopnea.      Objective:   Weight Range:  Vital Signs:   Temp:  [97.9 F (36.6 C)-98.6 F (37 C)] 98.6 F (37 C) (06/16 0519) Pulse Rate:  [85-87] 86 (06/16 0519) Resp:  [18] 18 (06/16 0519) BP: (102-109)/(48-51) 104/51 mmHg (06/16 0519) SpO2:  [91 %-96 %] 91 % (06/16 0519) Weight:  [179 lb 9.6 oz (81.466 kg)] 179 lb 9.6 oz (81.466 kg) (06/16 0517) Last BM Date: 07/22/13  Weight change: Filed Weights   07/22/13 0520 07/23/13 0532 07/24/13 0517  Weight: 194 lb 3.6 oz (88.1 kg) 183 lb 3.2 oz (83.1 kg) 179 lb 9.6 oz (81.466 kg)    Intake/Output:   Intake/Output Summary (Last 24 hours) at 07/24/13 0931 Last data filed at 07/24/13 0920  Gross per 24 hour  Intake 1680.33 ml  Output   5350 ml  Net -3669.67 ml     Physical Exam: General: Well appearing. No resp difficulty, sitting in the side of the bed HEENT: normal  Neck: supple. JVP ~10  Carotids 2+  bilat; no bruits. No lymphadenopathy or thryomegaly appreciated.  Cor: PMI nondisplaced. Regular rate & rhythm. No rubs or gallops, 2/6 AS and MR  Lungs: clear  Abdomen: tight, nontender, +distended. No hepatosplenomegaly. No bruits or masses. Good bowel sounds.  Extremities: no cyanosis, clubbing, rash, R and LLE with unna boot. 1+ thigh edema.  Neuro: alert & orientedx3, cranial nerves grossly intact. moves all 4 extremities w/o difficulty. Affect pleasant  Telemetry: SR  Labs: Basic Metabolic Panel:  Recent Labs Lab 07/20/13 0600 07/21/13 0840 07/22/13 0527 07/23/13 0420 07/24/13 0610  NA 134* 130* 132* 131* 131*  K 4.9 3.8 3.8 3.6* 3.3*  CL 90* 87* 87* 85* 86*  CO2 30 30 30  32 32  GLUCOSE 90 150* 90 91 91  BUN 21 22 21 23  27*  CREATININE 0.98 0.88 0.89 0.93 0.79  CALCIUM 8.9 8.6 8.6 9.1 8.9    Liver Function Tests: No results found for this basename: AST, ALT, ALKPHOS, BILITOT, PROT, ALBUMIN,  in the last 168 hours No results found for this basename: LIPASE, AMYLASE,  in the last 168 hours No results found for this basename: AMMONIA,  in the last 168 hours  CBC:  Recent Labs Lab 07/20/13 0600 07/21/13 0840  WBC 6.6 6.8  HGB 7.1* 8.5*  HCT 23.0* 26.8*  MCV 89.5 87.9  PLT 146* 158    Cardiac Enzymes: No results found for this basename: CKTOTAL, CKMB, CKMBINDEX, TROPONINI,  in the last 168 hours  BNP: BNP (last 3 results)  Recent Labs  07/09/13 1602 07/24/13 0610  PROBNP 1673.0* 837.4*     Other results:    Imaging: No results found.   Medications:     Scheduled Medications: . ferrous sulfate  325 mg Oral Q breakfast  . gabapentin  300 mg Oral QHS  . heparin  5,000 Units Subcutaneous 3 times per day  . lactose free nutrition  237 mL Oral BID BM  . levothyroxine  200 mcg Oral QAC breakfast  . metolazone  5 mg Oral Daily  . polyethylene glycol  17 g Oral BID  . potassium chloride  40 mEq Oral BID  . potassium chloride  40 mEq Oral BID   . sodium chloride  3 mL Intravenous Q12H  . spironolactone  25 mg Oral Daily    Infusions: . furosemide (LASIX) infusion 10 mg/hr (07/24/13 0225)    PRN Medications: sodium chloride, acetaminophen, alum & mag hydroxide-simeth, famotidine, guaiFENesin-dextromethorphan, ondansetron, sodium chloride, sodium chloride, traMADol   Assessment:   1) A/C diastolic HF  2) HTN 3) ?OSA/OHS  4) AI s/p AVR  5) Obese  6) hypothyroidism s/p thyroidectomy  7) Hypokalemia  Plan/Discussion:    Nicole Hansen is a 73 yo female who has been admitted multiple times for diastolic HF at [facility].   Volume status improving. Continue lasix drip 10 mg per hour and metolazone 5 mg daily. Plan to transition to torsemide after she is fully diuresed. Renal function remains stable.  Supplement potassium.   Consult cardiac rehab.   Length of Stay: 15  CLEGG,AMY NP-C 07/24/2013, 9:31 AM  Advanced Heart Failure Team Pager [phone] (M-F; [place])  Please contact [facility] for night-coverage after hours (4p -7a ) and weekends on [email or web address]  Patient seen and examined with Darrick Grinder, NP. We discussed all aspects of the encounter. I agree with the assessment and plan as stated above. Continues to diurese well. Now down 36 pounds. Still with volume on board but getting close. Continue current regimen.   Benay Spice 5:34 PM

## 2013-07-24 NOTE — Progress Notes (Signed)
[id number] Cardiac Rehab Pt is off telemetry so did not ambulate pt. I did complete CHF education with pt. She was able to correctly tell me how much weight gain that she should call for, how much sodium she should have in a day and her fluid restrictions. Pt knows when to call MD and 911.I gave her exercise guidelines and encouraged her to join a gym, [employer] and to check with her insurance to see if she has the silver sneaker program. Cardiac Rehab goals met, will sign off. Deon Pilling, RN 07/24/2013 3:49 PM

## 2013-07-25 DIAGNOSIS — I509 Heart failure, unspecified: Secondary | ICD-10-CM

## 2013-07-25 LAB — BASIC METABOLIC PANEL
BUN: 28 mg/dL — ABNORMAL HIGH (ref 6–23)
CHLORIDE: 88 meq/L — AB (ref 96–112)
CO2: 31 mEq/L (ref 19–32)
Calcium: 9.2 mg/dL (ref 8.4–10.5)
Creatinine, Ser: 0.84 mg/dL (ref 0.50–1.10)
GFR, EST AFRICAN AMERICAN: 78 mL/min — AB (ref >90)
GFR, EST NON AFRICAN AMERICAN: 67 mL/min — AB (ref >90)
Glucose, Bld: 96 mg/dL (ref 70–99)
POTASSIUM: 3.8 meq/L (ref 3.7–5.3)
Sodium: 134 mEq/L — ABNORMAL LOW (ref 137–147)

## 2013-07-25 MED ORDER — TORSEMIDE 20 MG PO TABS
20.0000 mg | ORAL_TABLET | Freq: Two times a day (BID) | ORAL | Status: DC
  Administered 2013-07-26: 20 mg via ORAL
  Filled 2013-07-25 (×3): qty 1

## 2013-07-25 MED ORDER — FUROSEMIDE 10 MG/ML IJ SOLN
80.0000 mg | Freq: Once | INTRAMUSCULAR | Status: AC
  Administered 2013-07-25: 80 mg via INTRAVENOUS
  Filled 2013-07-25: qty 8

## 2013-07-25 NOTE — Progress Notes (Signed)
Advanced Heart Failure Rounding Note  Primary Physician: Rubie Maid, MD  Primary Cardiologist: Dr. Wyline Copas  Subjective:    Ms Hinkson is a 73 yo female with a history of AI s/p AVR (bovine), hypothyroidism s/p thyroidectomy, HTN, obesity and diastolic HF.   She has been admitted multiple times to [facility] for volume overload. During her stay at [facility] she would be diuresed and then was transitioned to PO lasix 20 mg for home. Daughter and patient report she can't take more than 20 mg daily of lasix because she urinates on the floor because she can't get to the bathroom in time. Of note she was transfused with 4 PRBC at [facility] within the past month and upper and lower endo was negative for bleeding.   ECHO 07/10/13: EF 55%, mild AI and valve area 0.88, moderate MS (mean = 8) with severe mitral annular calcification, LA mod dilated and RV mildly dilated, moderate TR  Admitted with volume overload. Diuresed with lasix drip and metolazone.  24 hour I/O -3.9 liters. Overall she has diuresed 40 pounds.  Denies SOB and orthopnea. Wants to go home.      Objective:   Weight Range:  Vital Signs:   Temp:  [98 F (36.7 C)-98.2 F (36.8 C)] 98.2 F (36.8 C) (06/17 0507) Pulse Rate:  [87-91] 88 (06/17 0507) Resp:  [18] 18 (06/17 0507) BP: (93-123)/(40-54) 93/40 mmHg (06/17 0507) SpO2:  [94 %-99 %] 98 % (06/17 0507) Weight:  [175 lb 3.2 oz (79.47 kg)] 175 lb 3.2 oz (79.47 kg) (06/17 0507) Last BM Date: 07/22/13  Weight change: Filed Weights   07/23/13 0532 07/24/13 0517 07/25/13 0507  Weight: 183 lb 3.2 oz (83.1 kg) 179 lb 9.6 oz (81.466 kg) 175 lb 3.2 oz (79.47 kg)    Intake/Output:   Intake/Output Summary (Last 24 hours) at 07/25/13 0808 Last data filed at 07/25/13 0753  Gross per 24 hour  Intake   1320 ml  Output   5050 ml  Net  -3730 ml     Physical Exam: General: Well appearing. No resp difficulty, sitting on BSC HEENT: normal  Neck: supple. JVP ~10  Carotids 2+ bilat;  no bruits. No lymphadenopathy or thryomegaly appreciated.  Cor: PMI nondisplaced. Regular rate & rhythm. No rubs or gallops, 2/6 AS and MR  Lungs: clear  Abdomen:  nontender, nondistended. No hepatosplenomegaly. No bruits or masses. Good bowel sounds.  Extremities: no cyanosis, clubbing, rash, R and LLE with unna boot.  Neuro: alert & orientedx3, cranial nerves grossly intact. moves all 4 extremities w/o difficulty. Affect pleasant  Telemetry: discontinued   Labs: Basic Metabolic Panel:  Recent Labs Lab 07/21/13 0840 07/22/13 0527 07/23/13 0420 07/24/13 0610 07/25/13 0559  NA 130* 132* 131* 131* 134*  K 3.8 3.8 3.6* 3.3* 3.8  CL 87* 87* 85* 86* 88*  CO2 30 30 32 32 31  GLUCOSE 150* 90 91 91 96  BUN 22 21 23  27* 28*  CREATININE 0.88 0.89 0.93 0.79 0.84  CALCIUM 8.6 8.6 9.1 8.9 9.2    Liver Function Tests: No results found for this basename: AST, ALT, ALKPHOS, BILITOT, PROT, ALBUMIN,  in the last 168 hours No results found for this basename: LIPASE, AMYLASE,  in the last 168 hours No results found for this basename: AMMONIA,  in the last 168 hours  CBC:  Recent Labs Lab 07/20/13 0600 07/21/13 0840  WBC 6.6 6.8  HGB 7.1* 8.5*  HCT 23.0* 26.8*  MCV 89.5 87.9  PLT 146* 158    Cardiac Enzymes: No results found for this basename: CKTOTAL, CKMB, CKMBINDEX, TROPONINI,  in the last 168 hours  BNP: BNP (last 3 results)  Recent Labs  07/09/13 1602 07/24/13 0610  PROBNP 1673.0* 837.4*     Other results:    Imaging: No results found.   Medications:     Scheduled Medications: . ferrous sulfate  325 mg Oral Q breakfast  . gabapentin  300 mg Oral QHS  . heparin  5,000 Units Subcutaneous 3 times per day  . lactose free nutrition  237 mL Oral BID BM  . levothyroxine  200 mcg Oral QAC breakfast  . metolazone  5 mg Oral Daily  . polyethylene glycol  17 g Oral BID  . potassium chloride  40 mEq Oral BID  . sodium chloride  3 mL Intravenous Q12H  .  spironolactone  25 mg Oral Daily    Infusions: . furosemide (LASIX) infusion 10 mg/hr (07/25/13 0559)    PRN Medications: sodium chloride, acetaminophen, alum & mag hydroxide-simeth, famotidine, guaiFENesin-dextromethorphan, ondansetron, sodium chloride, sodium chloride, traMADol   Assessment:   1) A/C diastolic HF  2) HTN 3) ?OSA/OHS  4) AI s/p AVR  5) Obese  6) hypothyroidism s/p thyroidectomy  7) Hypokalemia  Plan/Discussion:    Mrs. Guderian is a 73 yo female who has been admitted multiple times for diastolic HF at [facility].   Volume status improving. Continue lasix drip 10 mg per hour and metolazone 5 mg daily. Plan to transition to torsemide after she is fully diuresed likely tomorrow. Anticipate transitioning to torsemide 20 mg twice a day.  Renal function remains stable.  Supplement potassium.   Home tomorrow.   Length of Stay: 16  CLEGG,AMY NP-C 07/25/2013, 8:08 AM  Advanced Heart Failure Team Pager [phone] (M-F; [place])  Please contact [facility] for night-coverage after hours (4p -7a ) and weekends on [email or web address]  Patient seen and examined with Darrick Grinder, NP. We discussed all aspects of the encounter. I agree with the assessment and plan as stated above.   She didn't sleep well lat night as her foley was removed but lasix gtt was continued. Still with volume on board but says she is going home tomorrow or signing out. Would continue lasix gtt and metolazone all day today and we will stop prior to bed. Start oral demadex tomorrow. Plan d/c tomorrow as per her wishes even though I think she needs another day or two.   Benay Spice 8:32 AM

## 2013-07-25 NOTE — Progress Notes (Signed)
[facility] PROGRESS NOTE Interim summary 73 y.o. 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 She has been admitted multiple times to [facility] for volume overload. During her stay at [facility] she would be diuresed and then was transitioned to PO lasix 20 mg for home. Daughter and patient report she can't take more than 20 mg daily of lasix because she urinates on the floor because she can't get to the bathroom in time. Of note she was transfused with 4 PRBC at [facility] within the past month and upper and lower endo was negative for acute bleeding.  Pertinent labs on admission Hemoglobin 8.7 creatinine 0.75, Pro BNP 1673 and Trop WNL. Started on lasix gtt with metolazone and eventually transition to IV lasix 80 mg q8hrs (to quantify PO regimen at discharge). Weight down 12 lbs since admission. Creatinine stablel 0.85.  ECHO 07/10/13: EF 55%, mild AI and valve area 0.88, moderate MS (mean = 8) with severe mitral annular calcification, LA mod dilated and RV mildly dilated, moderate TR  Still fluid overload and heart failure team has been consulted; plan is for more diuresis, will need to stay in the hospital to titrate diuretic orally.  Filed Weights   07/23/13 0532 07/24/13 0517 07/25/13 0507  Weight: 83.1 kg (183 lb 3.2 oz) 81.466 kg (179 lb 9.6 oz) 79.47 kg (175 lb 3.2 oz)        Intake/Output Summary (Last 24 hours) at 07/25/13 1758 Last data filed at 07/25/13 1734  Gross per 24 hour  Intake   1320 ml  Output   4025 ml  Net  -2705 ml     Assessment/Plan:  Acute respiratory failure due to acute on chronic diastolic HF: - BP soft stable, Cr stable. - Good diuresis with current regimen continues. Weight is trending down. - Appreciate assistance from heart failure team and will follow further  rec's - Continue daily weight, fluid restriction, low sodium diet. - She is on lasix (no single dose), metolazone and aldactone, currently not on a BB or ACE-I due to soft BP. - still with some fluid overload; but patient is demanding to go home in am; has decline the use of any further lasix drip -patient has lost approx 37 pounds since admission   Essential HTN: - BP soft but stable  Anemia: - No signs of overt bleeding - Continue ferrous sulfate - Hgb stable  Neuropathy: -continue gabapentin  Hypothyroidism: -TSH 7.3 -synthroid adjusted to 246mcg during this admission -will need repeat thyroid function test in 3 week after discharge  Mitral stenosis: -patient will like to follow and discussed with cardiology future treatments -currently plan is for medication management   Code Status: full Family Communication: none  Disposition Plan: remains inpatient   Consultants:  Heart failure team  Procedures: ECHO: - Left ventricle: The cavity size was normal. Wall thickness was normal. The estimated ejection fraction was 55%. - Aortic valve: There was mild stenosis. Valve area (Vmax): 0.88 cm^2. - Mitral valve: There is severe mitral annular calcificatin causing moderate stenosis across the mitral oriface There was mild regurgitation. Valve area by continuity equation (using LVOT flow): 0.92 cm^2. - Left atrium: The atrium was moderately dilated. - Right ventricle: The cavity size was mildly dilated. - Right atrium: The atrium was mildly dilated. - Tricuspid valve: There was moderate regurgitation.   LE  duplex: no DVT, baker cyst or SVT.  Antibiotics:  none (indicate start date, and stop date if known)  HPI/Subjective:. in no acute distress; reports breathing is at baseline and feel swelling continue improving. Wants to go home.  Objective: Filed Vitals:   07/24/13 1300 07/24/13 2031 07/25/13 0507 07/25/13 1300  BP: 123/51 98/52 93/40  101/48  Pulse: 88 91 88 82   Temp: 98 F (36.7 C) 98.1 F (36.7 C) 98.2 F (36.8 C) 98 F (36.7 C)  TempSrc: Oral Oral Oral Oral  Resp: 18 18 18 18   Height:      Weight:   79.47 kg (175 lb 3.2 oz)   SpO2: 99% 94% 98% 98%    Exam: General: in no acute distress; reports breathing is at baseline and feel swelling continue improving.  HEENT: No bruits, no goiter. No JVD Heart: Regular rate and rhythm, 2+ edema in her legs and Thigh Lungs: clear with good air movement  Abdomen: Soft, nontender, nondistended, positive bowel sounds.   Data Reviewed: Basic Metabolic Panel:  Recent Labs Lab 07/21/13 0840 07/22/13 0527 07/23/13 0420 07/24/13 0610 07/25/13 0559  NA 130* 132* 131* 131* 134*  K 3.8 3.8 3.6* 3.3* 3.8  CL 87* 87* 85* 86* 88*  CO2 30 30 32 32 31  GLUCOSE 150* 90 91 91 96  BUN 22 21 23  27* 28*  CREATININE 0.88 0.89 0.93 0.79 0.84  CALCIUM 8.6 8.6 9.1 8.9 9.2   BNP (last 3 results)  Recent Labs  07/09/13 1602 07/24/13 0610  PROBNP 1673.0* 837.4*    Studies: No results found.  Scheduled Meds: . ferrous sulfate  325 mg Oral Q breakfast  . furosemide  80 mg Intravenous Once  . gabapentin  300 mg Oral QHS  . heparin  5,000 Units Subcutaneous 3 times per day  . lactose free nutrition  237 mL Oral BID BM  . levothyroxine  200 mcg Oral QAC breakfast  . metolazone  5 mg Oral Daily  . polyethylene glycol  17 g Oral BID  . potassium chloride  40 mEq Oral BID  . sodium chloride  3 mL Intravenous Q12H  . spironolactone  25 mg Oral Daily  . [START ON 07/26/2013] torsemide  20 mg Oral BID   Continuous Infusions:   Time > 30 minutes  Barton Dubois  [facility] Pager [phone] If 8PM-8AM, please contact night-coverage at [email or web address], password [facility] 07/25/2013, 5:58 PM  LOS: 16 days     **Disclaimer: This note may have been dictated with voice recognition software. Similar sounding words can inadvertently be transcribed and this note may contain transcription errors which may not  have been corrected upon publication of note.**

## 2013-07-26 LAB — BASIC METABOLIC PANEL
BUN: 34 mg/dL — ABNORMAL HIGH (ref 6–23)
CHLORIDE: 91 meq/L — AB (ref 96–112)
CO2: 31 mEq/L (ref 19–32)
Calcium: 9.2 mg/dL (ref 8.4–10.5)
Creatinine, Ser: 0.9 mg/dL (ref 0.50–1.10)
GFR calc non Af Amer: 62 mL/min — ABNORMAL LOW (ref >90)
GFR, EST AFRICAN AMERICAN: 72 mL/min — AB (ref >90)
Glucose, Bld: 96 mg/dL (ref 70–99)
POTASSIUM: 4.2 meq/L (ref 3.7–5.3)
Sodium: 134 mEq/L — ABNORMAL LOW (ref 137–147)

## 2013-07-26 MED ORDER — TORSEMIDE 20 MG PO TABS: 20.0000 mg | ORAL_TABLET | Freq: Two times a day (BID) | ORAL | Status: DC

## 2013-07-26 MED ORDER — LEVOTHYROXINE SODIUM 200 MCG PO TABS: 200.0000 ug | ORAL_TABLET | Freq: Every day | ORAL | Status: AC

## 2013-07-26 MED ORDER — SPIRONOLACTONE 25 MG PO TABS: 25.0000 mg | ORAL_TABLET | Freq: Every day | ORAL | Status: DC

## 2013-07-26 MED ORDER — BOOST PLUS PO LIQD: 237.0000 mL | Freq: Two times a day (BID) | ORAL | Status: DC

## 2013-07-26 NOTE — Progress Notes (Signed)
Pt. Given discharge instructions and prescriptions and all questions answered.  Pt. Discharged via wheelchair with all belongings.

## 2013-07-26 NOTE — Progress Notes (Addendum)
Advanced Heart Failure Rounding Note  Primary Physician: Rubie Maid, MD  Primary Cardiologist: Dr. Wyline Copas  Subjective:    Nicole Hansen is a 73 yo female with a history of AI s/p AVR (bovine), hypothyroidism s/p thyroidectomy, HTN, obesity and diastolic HF.   She has been admitted multiple times to [facility] for volume overload. During her stay at [facility] she would be diuresed and then was transitioned to PO lasix 20 mg for home. Daughter and patient report she can't take more than 20 mg daily of lasix because she urinates on the floor because she can't get to the bathroom in time. Of note she was transfused with 4 PRBC at [facility] within the past month and upper and lower endo was negative for bleeding.   ECHO 07/10/13: EF 55%, mild AI and valve area 0.88, moderate Nicole (mean = 8) with severe mitral annular calcification, LA mod dilated and RV mildly dilated, moderate TR  Admitted with volume overload. Diuresed with lasix drip and metolazone.  Down another 2 pounds. Overall she has diuresed 42 pounds. Denies SOB and orthopnea. Says she is going home TODAY.      Objective:   Weight Range:  Vital Signs:   Temp:  [98 F (36.7 C)-98.4 F (36.9 C)] 98.4 F (36.9 C) (06/18 0510) Pulse Rate:  [82-96] 90 (06/18 0510) Resp:  [18] 18 (06/18 0510) BP: (96-122)/(48-55) 96/55 mmHg (06/18 0510) SpO2:  [98 %] 98 % (06/18 0510) Weight:  [78.472 kg (173 lb)] 78.472 kg (173 lb) (06/18 0510) Last BM Date: 07/25/13  Weight change: Filed Weights   07/24/13 0517 07/25/13 0507 07/26/13 0510  Weight: 81.466 kg (179 lb 9.6 oz) 79.47 kg (175 lb 3.2 oz) 78.472 kg (173 lb)    Intake/Output:   Intake/Output Summary (Last 24 hours) at 07/26/13 0952 Last data filed at 07/26/13 0918  Gross per 24 hour  Intake   1680 ml  Output   3126 ml  Net  -1446 ml     Physical Exam: General: Well appearing. No resp difficulty, sitting on BSC HEENT: normal  Neck: supple. JVP ~10  Carotids 2+ bilat; no bruits. No  lymphadenopathy or thryomegaly appreciated.  Cor: PMI nondisplaced. Regular rate & rhythm. No rubs or gallops, 2/6 AS and MR  Lungs: clear  Abdomen:  nontender, nondistended. No hepatosplenomegaly. No bruits or masses. Good bowel sounds.  Extremities: no cyanosis, clubbing, rash, R and LLE with unna boot. TR-1+ edema.  Neuro: alert & orientedx3, cranial nerves grossly intact. moves all 4 extremities w/o difficulty. Affect pleasant  Telemetry: discontinued   Labs: Basic Metabolic Panel:  Recent Labs Lab 07/22/13 0527 07/23/13 0420 07/24/13 0610 07/25/13 0559 07/26/13 0445  NA 132* 131* 131* 134* 134*  K 3.8 3.6* 3.3* 3.8 4.2  CL 87* 85* 86* 88* 91*  CO2 30 32 32 31 31  GLUCOSE 90 91 91 96 96  BUN 21 23 27* 28* 34*  CREATININE 0.89 0.93 0.79 0.84 0.90  CALCIUM 8.6 9.1 8.9 9.2 9.2    Liver Function Tests: No results found for this basename: AST, ALT, ALKPHOS, BILITOT, PROT, ALBUMIN,  in the last 168 hours No results found for this basename: LIPASE, AMYLASE,  in the last 168 hours No results found for this basename: AMMONIA,  in the last 168 hours  CBC:  Recent Labs Lab 07/20/13 0600 07/21/13 0840  WBC 6.6 6.8  HGB 7.1* 8.5*  HCT 23.0* 26.8*  MCV 89.5 87.9  PLT 146* 158  Cardiac Enzymes: No results found for this basename: CKTOTAL, CKMB, CKMBINDEX, TROPONINI,  in the last 168 hours  BNP: BNP (last 3 results)  Recent Labs  07/09/13 1602 07/24/13 0610  PROBNP 1673.0* 837.4*     Other results:    Imaging: No results found.   Medications:     Scheduled Medications: . ferrous sulfate  325 mg Oral Q breakfast  . gabapentin  300 mg Oral QHS  . heparin  5,000 Units Subcutaneous 3 times per day  . lactose free nutrition  237 mL Oral BID BM  . levothyroxine  200 mcg Oral QAC breakfast  . metolazone  5 mg Oral Daily  . polyethylene glycol  17 g Oral BID  . potassium chloride  40 mEq Oral BID  . sodium chloride  3 mL Intravenous Q12H  .  spironolactone  25 mg Oral Daily  . torsemide  20 mg Oral BID    Infusions:    PRN Medications: sodium chloride, acetaminophen, alum & mag hydroxide-simeth, famotidine, guaiFENesin-dextromethorphan, ondansetron, sodium chloride, sodium chloride, traMADol   Assessment:   1) A/C diastolic HF  2) HTN 3) ?OSA/OHS  4) AI s/p AVR  5) Obese  6) hypothyroidism s/p thyroidectomy  7) Hypokalemia  Plan/Discussion:    Home today. Volume status much improved but still with a mild amount of residual volume overload. She insists she is going home today. I think that is ok. Will d/c home on current meds. Change lasix to torseide 40 daily with instructions to increase to bid as needed. Kcl 40 daily.  Will see next week in HF Clinic.   Length of Stay: 17  Benay Spice 9:53 AM   Advanced Heart Failure Team Pager [phone] (M-F; 7a - 4p)  Please contact [facility] for night-coverage after hours (4p -7a ) and weekends on [email or web address]

## 2013-07-26 NOTE — Discharge Summary (Signed)
Physician Discharge Summary  Nicole Hansen RSW:[id number] DOB: [birth year] DOA: 07/09/2013  PCP: Rubie Maid, MD  Admit date: 07/09/2013 Discharge date: 07/26/2013  Time spent: >30 minutes  Recommendations for Outpatient Follow-up:  1. BMET to follow electrolytes and renal function (to be done by heart failure team service and PCP) 2. Reassess BP and start b-blocker and/or ARB as previously taken if needed for better control (to be done by heart failure team service) 3. TSH and free T4 to reassess thyroid function; adjust synthroid at that moment as needed (to be done by PCP) 4. CBC to follow Hgb trend (to be done by PCP)  BNP    Component Value Date/Time   PROBNP 837.4* 07/24/2013 0610   Filed Weights   07/24/13 0517 07/25/13 0507 07/26/13 0510  Weight: 81.466 kg (179 lb 9.6 oz) 79.47 kg (175 lb 3.2 oz) 78.472 kg (173 lb)     Discharge Diagnoses:  Active Problems:   Acute respiratory failure   Benign essential HTN   Hypokalemia   Acute on chronic diastolic CHF (congestive heart failure) Hypothyroidism Idiopathic neuropathy   Discharge Condition: stable and improved. Will discharge home. Will follow with PCP in 2 weeks and with Heart failure team on 07/30/13  Diet recommendation: Low sodium diet  History of present illness:  73 y.o. 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 Patient gets her [facility] to [facility]. Patient states that she was seen back in March for an upper respiratory infection by her primary care physician. Was put on antibiotics and steroids. Patient had a significant amount of weight gain with steroids and has been subsequently hospitalized several times for shortness of breath. Station she's been seen several times by the cardiologist for  swelling and dyspnea. States she has had normal echocardiograms. Was instructed to take higher doses of Lasix and she would like. Daughter and patient state that she can only tolerate up to 20 mg of Lasix daily. Otherwise she urinates excessively on the floor. Patient states she's gained up to 50 pounds of weight since initial steroids dosing. Still up with 30-40 pounds with the 20 mg of Lasix. Denies any chest pain. Has had some chest pressure. Positive orthopnea. Positive lower extremity swelling. States that she was also transfused 4 units of blood and [place] within the past month. Had an upper and lower endoscopy that was negative for any bleeding.  Presented to the ER today hemodynamically stable. Pressures in the 100s to 120s. Satting 90-95% on room air. White blood cell count 4.8, hemoglobin 8.7 creatinine 0.75. Pro BNP 1673. Trop WNL.   Hospital Course:  Acute respiratory failure due to acute on chronic diastolic HF:  - BP soft stable, Cr stable.  - Good diuresis with current regimen continues. Weight remains stable with transition to oral regimen.  - Appreciate assistance from heart failure team and patient will continue to follow with heart failure clinic after discharge for further rec's  - Continue daily weight, fluid restriction and low sodium diet as instructed.  -no B-blocker or ARB due to soft BP at discharge; reassess needs of this medication during outpatient follow up - still with some fluid overload; but patient is demanding to go home and is overall very stable to grant this. Will follow on 6/22 with Heart failure team  -patient has lost approx 42  pounds since admission   Essential HTN:  - BP soft but stable  -will hold cozaar and metoprolol until follow up -advise to follow low sodium diet -spironolactone and demadex controlling BP  Anemia:  -No signs of overt bleeding  -Continue ferrous sulfate  -Hgb remained stable during admission -Follow CBC as an  outpatient  Neuropathy:  -continue gabapentin   Hypothyroidism:  -TSH 7.3  -synthroid adjusted to 282mcg during this admission  -will need repeat thyroid function test in 2 weeks after discharge   Mitral stenosis:  -patient will like to follow and discussed with cardiology future treatments  -currently plan is for medication management   Procedures:  ECHO: - Left ventricle: The cavity size was normal. Wall thickness was normal. The estimated ejection fraction was 55%. - Aortic valve: There was mild stenosis. Valve area (Vmax): 0.88 cm^2. - Mitral valve: There is severe mitral annular calcificatin causing moderate stenosis across the mitral oriface There was mild regurgitation. Valve area by continuity equation (using LVOT flow): 0.92 cm^2. - Left atrium: The atrium was moderately dilated. - Right ventricle: The cavity size was mildly dilated. - Right atrium: The atrium was mildly dilated. - Tricuspid valve: There was moderate regurgitation.   LE duplex: no DVT, baker cyst or SVT.  Consultations:  Cardiology (heart failure service)  Discharge Exam: Filed Vitals:   07/26/13 0510  BP: 96/55  Pulse: 90  Temp: 98.4 F (36.9 C)  Resp: 18   General: Well appearing. No resp difficulty, sitting on BSC  HEENT: normal  Neck: supple. JVP ~10 Carotids 2+ bilat; no bruits. No lymphadenopathy or thryomegaly appreciated.  Cor: PMI nondisplaced. Regular rate & rhythm. No rubs or gallops, 2/6 AS and MR  Lungs: clear to auscultation bilaterally Abdomen: nontender, nondistended. No hepatosplenomegaly. No bruits or masses. Good bowel sounds.  Extremities: no cyanosis, clubbing, rash, 1+ edema.  Neuro: alert & orientedx3, cranial nerves grossly intact. moves all 4 extremities w/o difficulty. Affect pleasant    Discharge Instructions  Discharge Instructions   Diet - low sodium heart healthy    Complete by:  As directed      Discharge instructions    Complete by:  As directed   Low  sodium diet (less than 2 grams daily) Take medications as prescribed Follow with Heart failure clinic as instructed Check your weight on daily basis            Medication List    STOP taking these medications       furosemide 20 MG tablet  Commonly known as:  LASIX     losartan 25 MG tablet  Commonly known as:  COZAAR     MENTHOL (TOPICAL ANALGESIC) EX     metoprolol tartrate 25 MG tablet  Commonly known as:  LOPRESSOR      TAKE these medications       acetaminophen 325 MG tablet  Commonly known as:  TYLENOL  Take 650 mg by mouth every 6 (six) hours as needed (pain).     ferrous sulfate 325 (65 FE) MG tablet  Take 325 mg by mouth 3 (three) times daily with meals.     gabapentin 300 MG capsule  Commonly known as:  NEURONTIN  Take 300 mg by mouth at bedtime.     lactose free nutrition Liqd  Take 237 mLs by mouth 2 (two) times daily between meals.     levothyroxine 200 MCG tablet  Commonly known as:  SYNTHROID, LEVOTHROID  Take 1 tablet (200 mcg  total) by mouth daily before breakfast.     LORazepam 1 MG tablet  Commonly known as:  ATIVAN  Take 1 mg by mouth at bedtime as needed for anxiety or sleep. sleep     nitrofurantoin (macrocrystal-monohydrate) 100 MG capsule  Commonly known as:  MACROBID  Take 100 mg by mouth 2 (two) times daily. 7 day supply started on 5/30     potassium chloride SA 20 MEQ tablet  Commonly known as:  K-DUR,KLOR-CON  Take 20 mEq by mouth 2 (two) times daily.     spironolactone 25 MG tablet  Commonly known as:  ALDACTONE  Take 1 tablet (25 mg total) by mouth daily.     torsemide 20 MG tablet  Commonly known as:  DEMADEX  Take 1 tablet (20 mg total) by mouth 2 (two) times daily.       Allergies  Allergen Reactions  . Pineapple Other (See Comments)    Sores in mouth  . Penicillins Rash       Follow-up Information   Follow up with [facility] On 07/30/2013. (Monday @ 245PM)     Specialty:  Cardiology   Contact information:   [address] [id number] [place] [address] [phone]      Follow up with Rubie Maid, MD In 2 weeks.   Specialty:  Family Medicine   Contact information:   [address] [place]  [address] [phone]       The results of significant diagnostics from this hospitalization (including imaging, microbiology, ancillary and laboratory) are listed below for reference.    Significant Diagnostic Studies: Dg Chest 2 View  07/09/2013   CLINICAL DATA:  Short of breath, increasing lower extremity swelling  EXAM: CHEST  2 VIEW  COMPARISON:  Prior chest x-ray 06/25/2013  FINDINGS: Stable cardiomegaly and mediastinal contours. Atherosclerotic calcifications present within the transverse aorta. Patient is status post median sternotomy with evidence of prior CABG and aortic valve replacement. Pulmonary vascular congestion with mild pulmonary edema but no significant interval progression compared to prior. Small bilateral right larger than left layering pleural effusions with associated bibasilar atelectasis. No acute osseous abnormality.  IMPRESSION: Similar appearance of mild compared to 06/25/2013.  Small right larger than left pleural effusions.   Electronically Signed   By: Jacqulynn Cadet M.D.   On: 07/09/2013 17:42   Ct Angio Chest Pe W/cm &/or Wo Cm  07/10/2013   CLINICAL DATA:  Dyspnea.  Elevated D-dimer.  EXAM: CT ANGIOGRAPHY CHEST WITH CONTRAST  TECHNIQUE: Multidetector CT imaging of the chest was performed using the standard protocol during bolus administration of intravenous contrast. Multiplanar CT image reconstructions and MIPs were obtained to evaluate the vascular anatomy.  CONTRAST:  61mL OMNIPAQUE IOHEXOL 350 MG/ML SOLN  COMPARISON:  05/12/2008  FINDINGS: THORACIC INLET/BODY WALL:  Diffuse skin and subcutaneous edema.  MEDIASTINUM:  Cardiomegaly. No pericardial effusion. Patient is status post aortic valve  replacement. There is bulky mitral annular calcification. Atherosclerosis, status post CABG. No adenopathy.  No evidence of pulmonary embolism. There is repeated respiratory motion which decreases sensitivity in evaluating the post segmental pulmonary arterial tree.  LUNG WINDOWS:  Moderate volume bilateral water density pleural effusions. There is some scalloping of the lung margins, but overall the fluid is simple appearing. There is dependent ground-glass density in the upper lungs, likely atelectasis. There is a small area of airspace disease in the lingula. Extensive lower lobe atelectasis. There is centrilobular emphysema at the apices,  better seen on the previous examination.  UPPER ABDOMEN:  Lobulated liver contour and probable splenomegaly, findings which suggest cirrhosis.  OSSEOUS:  No acute fracture.  No suspicious lytic or blastic lesions.  Review of the MIP images confirms the above findings.  IMPRESSION: 1. Negative for pulmonary embolism to the proximal segmental level. 2. Moderate bilateral pleural effusions with extensive atelectasis. 3. Possible early pneumonia in the lingula. 4. Probable cirrhosis.   Electronically Signed   By: Jorje Guild M.D.   On: 07/10/2013 03:51   Labs: Basic Metabolic Panel:  Recent Labs Lab 07/22/13 0527 07/23/13 0420 07/24/13 0610 07/25/13 0559 07/26/13 0445  NA 132* 131* 131* 134* 134*  K 3.8 3.6* 3.3* 3.8 4.2  CL 87* 85* 86* 88* 91*  CO2 30 32 32 31 31  GLUCOSE 90 91 91 96 96  BUN 21 23 27* 28* 34*  CREATININE 0.89 0.93 0.79 0.84 0.90  CALCIUM 8.6 9.1 8.9 9.2 9.2   CBC:  Recent Labs Lab 07/20/13 0600 07/21/13 0840  WBC 6.6 6.8  HGB 7.1* 8.5*  HCT 23.0* 26.8*  MCV 89.5 87.9  PLT 146* 158   BNP: BNP (last 3 results)  Recent Labs  07/09/13 1602 07/24/13 0610  PROBNP 1673.0* 837.4*   CBG:  Recent Labs Lab 07/24/13 1136  GLUCAP 131*    Signed:  Barton Dubois  [facility] 07/26/2013, 2:28 PM   **Disclaimer:  This note may have been dictated with voice recognition software. Similar sounding words can inadvertently be transcribed and this note may contain transcription errors which may not have been corrected upon publication of note.**

## 2013-07-27 NOTE — Progress Notes (Signed)
Patient ID: Nicole [place], female   DOB: [birth year], 73 y.o.   MRN: [id number] Primary Cardiologist: Dr Wyline Copas PCP: Dr Bebe Shaggy  HPI: [place] is a 73 yo female with a history of AI s/p AVR (bovine), hypothyroidism s/p thyroidectomy, HTN, obesity and diastolic HF. Admitted multiple times to [facility] for volume overload. During her stay at [facility] she would be diuresed and then was transitioned to PO lasix 20 mg for home. Of note she was transfused with 4 PRBC at [facility] within the past month and upper and lower endo was negative for bleeding.   Admitted to [facility] June 1st  through June 18th with volume overload with lasix drip and metolazone. Diuresed 42 pounds and transitioned to torsemide 40 mg daily. Discharge weight was 173 pounds.  She returns for post hospital follow up. Overall she feels great. Denies SOB/PND/Orthopnea. Weight at home trending down from 174 to 169 pounds. Increased lower extremity edema. Complaint with medications. Drinking > 2 liters per day. Following low salt diet.   FH: Mother deceased: HTN, AAA  Father deceased: HTN, CHF  SH: Lives with husband in [place], No ETOH or tobacco abuse.  ROS: All systems negative except as listed in HPI, PMH and Problem List.  No past medical history on file.  Current Outpatient Prescriptions  Medication Sig Dispense Refill  . ferrous sulfate 325 (65 FE) MG tablet Take 325 mg by mouth 3 (three) times daily with meals.       . gabapentin (NEURONTIN) 300 MG capsule Take 300 mg by mouth at bedtime.      . lactose free nutrition (BOOST PLUS) LIQD Take 237 mLs by mouth 2 (two) times daily between meals.    0  . levothyroxine (SYNTHROID, LEVOTHROID) 200 MCG tablet Take 1 tablet (200 mcg total) by mouth daily before breakfast.  30 tablet  1  . LORazepam (ATIVAN) 1 MG tablet Take 1 mg by mouth at bedtime as needed for anxiety or sleep. sleep      . nitrofurantoin, macrocrystal-monohydrate, (MACROBID) 100 MG capsule Take 100 mg by mouth 2 (two)  times daily. 7 day supply started on 5/30      . spironolactone (ALDACTONE) 25 MG tablet Take 1 tablet (25 mg total) by mouth daily.  30 tablet  1  . torsemide (DEMADEX) 20 MG tablet Take 1 tablet (20 mg total) by mouth 2 (two) times daily.  60 tablet  1  . acetaminophen (TYLENOL) 325 MG tablet Take 650 mg by mouth every 6 (six) hours as needed (pain).       . potassium chloride SA (K-DUR,KLOR-CON) 20 MEQ tablet Take 40 mEq by mouth 4 (four) times daily.        No current facility-administered medications for this encounter.     PHYSICAL EXAM: Filed Vitals:   07/30/13 1456  BP: 118/53  Pulse: 88  Resp: 18  Weight: 175 lb 2 oz (79.436 kg)  SpO2: 99%    General:  Well appearing. No resp difficulty Daughter present  HEENT: normal Neck: supple. JVP ~10 . Carotids 2+ bilaterally; no bruits. No lymphadenopathy or thryomegaly appreciated. Cor: PMI normal. Regular rate & rhythm. No rubs, gallops or murmurs. Lungs: clear Abdomen: soft, nontender, nondistended. No hepatosplenomegaly. No bruits or masses. Good bowel sounds. Extremities: no cyanosis, clubbing, rash, R and LLE 2-3+ extends to thigh.  Neuro: alert & orientedx3, cranial nerves grossly intact. Moves all 4 extremities w/o difficulty. Affect pleasant.      ASSESSMENT & PLAN:  1. Chronic Diastolic HF. NYHA II-III. Volume status trending up. She has lower extremity edema that extends to thigh. Now with thigh edema despite weight going down at home.  Will increase torsemide to 40 mg in am and continue 20 mg in pm. Reinforced daily weight, low salt food choices, and limiting fluid intake to < 2 liters per day.  Asked to wear ted hose daily. Provided script for for ted hose.  Check BMET today.   Follow up in 2 weeks to reassess volume status    CLEGG,AMY NP-C  4:01 PM

## 2013-07-30 ENCOUNTER — Encounter (HOSPITAL_COMMUNITY): Payer: Self-pay

## 2013-07-30 ENCOUNTER — Ambulatory Visit (HOSPITAL_COMMUNITY)
Admit: 2013-07-30 | Discharge: 2013-07-30 | Disposition: A | Discharge status: Home / Self Care | Payer: Medicare Other | Source: Ambulatory Visit | Attending: Cardiology | Admitting: Cardiology

## 2013-07-30 VITALS — BP 118/53 | HR 88 | Resp 18 | Wt 175.1 lb

## 2013-07-30 DIAGNOSIS — I5032 Chronic diastolic (congestive) heart failure: Secondary | ICD-10-CM | POA: Insufficient documentation

## 2013-07-30 LAB — BASIC METABOLIC PANEL
BUN: 38 mg/dL — ABNORMAL HIGH (ref 6–23)
CHLORIDE: 92 meq/L — AB (ref 96–112)
CO2: 22 meq/L (ref 19–32)
Calcium: 9.3 mg/dL (ref 8.4–10.5)
Creatinine, Ser: 0.87 mg/dL (ref 0.50–1.10)
GFR calc Af Amer: 75 mL/min — ABNORMAL LOW (ref >90)
GFR calc non Af Amer: 65 mL/min — ABNORMAL LOW (ref >90)
Glucose, Bld: 91 mg/dL (ref 70–99)
Potassium: 5.1 mEq/L (ref 3.7–5.3)
SODIUM: 130 meq/L — AB (ref 137–147)

## 2013-07-30 MED ORDER — TORSEMIDE 20 MG PO TABS: ORAL_TABLET | ORAL | Status: DC

## 2013-07-30 NOTE — Patient Instructions (Signed)
Follow up in 2 weeks  Take  torsemide 40 mg in am (2 tablets) and 20 mg Torsemide in pm ( 1 tablet)  Do the following things EVERYDAY: 1) Weigh yourself in the morning before breakfast. Write it down and keep it in a log. 2) Take your medicines as prescribed 3) Eat low salt foods-Limit salt (sodium) to 2000 mg per day.  4) Stay as active as you can everyday 5) Limit all fluids for the day to less than 2 liters

## 2013-07-31 ENCOUNTER — Telehealth (HOSPITAL_COMMUNITY): Payer: Self-pay

## 2013-07-31 NOTE — Telephone Encounter (Signed)
Lab results reviewed with patient.  Aware and appreciative.

## 2013-08-12 NOTE — Progress Notes (Signed)
Patient ID: [place], female   DOB: Jul 28, 1940, 73 y.o.   MRN: [id number] Primary Cardiologist: Dr Wyline Copas PCP: Dr Bebe Shaggy  HPI: Ms [place] is a 73 yo female with a history of AI s/p AVR (bovine), hypothyroidism s/p thyroidectomy, HTN, obesity and diastolic HF. Admitted multiple times to [facility] for volume overload. During her stay at [facility] she would be diuresed and then was transitioned to PO lasix 20 mg for home. Of note she was transfused with 4 PRBC at [facility] within the past month and upper and lower endo was negative for bleeding.   Admitted to [facility] June 1st  through June 18th with volume overload with lasix drip and metolazone. Diuresed 42 pounds and transitioned to torsemide 40 mg daily. Discharge weight was 173 pounds.  She returns for follow up. Last visit torsemide was increased to 40 mg in am and 20 mg in pm. Overall she feels great. Denies SOB/PND. Sleeps on 2 pillows but this is her habit.  Weight at home 168-170 pounds. Taking all medications. Drinking > 2 liters per day. Following low salt diet but did have pizza this weekend.   Labs 07/30/13 K 5.1 Creatine 0.87 Labs 07/26/13 K 4.2 Creatinine 0.87  FH: Mother deceased: HTN, AAA  Father deceased: HTN, CHF  SH: Lives with husband in [place], No ETOH or tobacco abuse.  ROS: All systems negative except as listed in HPI, PMH and Problem List.  No past medical history on file.  Current Outpatient Prescriptions  Medication Sig Dispense Refill  . acetaminophen (TYLENOL) 325 MG tablet Take 650 mg by mouth every 6 (six) hours as needed (pain).       . ferrous sulfate 325 (65 FE) MG tablet Take 325 mg by mouth 3 (three) times daily with meals.       . gabapentin (NEURONTIN) 300 MG capsule Take 300 mg by mouth at bedtime.      . lactose free nutrition (BOOST PLUS) LIQD Take 237 mLs by mouth 2 (two) times daily between meals.    0  . levothyroxine (SYNTHROID, LEVOTHROID) 200 MCG tablet Take 1 tablet (200 mcg total) by mouth daily  before breakfast.  30 tablet  1  . LORazepam (ATIVAN) 1 MG tablet Take 1 mg by mouth at bedtime as needed for anxiety or sleep. sleep      . nitrofurantoin, macrocrystal-monohydrate, (MACROBID) 100 MG capsule Take 100 mg by mouth 2 (two) times daily. 7 day supply started on 5/30      . potassium chloride SA (K-DUR,KLOR-CON) 20 MEQ tablet Take 40 mEq by mouth 4 (four) times daily.       Marland Kitchen spironolactone (ALDACTONE) 25 MG tablet Take 1 tablet (25 mg total) by mouth daily.  30 tablet  1  . torsemide (DEMADEX) 20 MG tablet Take 40 mg in am (2 tabs) and 20 mg in pm (1 tab)  90 tablet  1   No current facility-administered medications for this encounter.     PHYSICAL EXAM: Filed Vitals:   08/13/13 1521  BP: 115/54  Pulse: 91  Resp: 18  Weight: 181 lb 4 oz (82.214 kg)  SpO2: 97%    General:  Well appearing. No resp difficulty Daughter present  HEENT: normal Neck: supple. JVP to jaw  . Carotids 2+ bilaterally; no bruits. No lymphadenopathy or thryomegaly appreciated. Cor: PMI normal. Regular rate & rhythm. No rubs, gallops or murmurs. Lungs: clear Abdomen: soft, nontender, nondistended. No hepatosplenomegaly. No bruits or masses. Good bowel sounds.  Extremities: no cyanosis, clubbing, rash, R and LLE 2+   Neuro: alert & orientedx3, cranial nerves grossly intact. Moves all 4 extremities w/o difficulty. Affect pleasant.      ASSESSMENT & PLAN: 1. Chronic Diastolic HF. NYHA II-III. Volume status elevated but  but she has not had torsemide today. For now will continue torsemidee 40 mg in am and 20 mg.  Check BMET now.  Reinforced daily weight, low salt food choices, and limiting fluid intake to < 2 liters per day.  Consider cardiomems devices.  Follow up in 1 month      CLEGG,AMY NP-C  3:26 PM

## 2013-08-13 ENCOUNTER — Ambulatory Visit (HOSPITAL_COMMUNITY)
Admission: RE | Admit: 2013-08-13 | Discharge: 2013-08-13 | Disposition: A | Discharge status: Home / Self Care | Payer: Medicare Other | Source: Ambulatory Visit | Attending: Internal Medicine | Admitting: Internal Medicine

## 2013-08-13 ENCOUNTER — Encounter (HOSPITAL_COMMUNITY): Payer: Self-pay

## 2013-08-13 VITALS — BP 115/54 | HR 91 | Resp 18 | Wt 181.2 lb

## 2013-08-13 DIAGNOSIS — Z8249 Family history of ischemic heart disease and other diseases of the circulatory system: Secondary | ICD-10-CM | POA: Insufficient documentation

## 2013-08-13 DIAGNOSIS — I509 Heart failure, unspecified: Secondary | ICD-10-CM | POA: Insufficient documentation

## 2013-08-13 DIAGNOSIS — I10 Essential (primary) hypertension: Secondary | ICD-10-CM | POA: Insufficient documentation

## 2013-08-13 DIAGNOSIS — Z954 Presence of other heart-valve replacement: Secondary | ICD-10-CM | POA: Insufficient documentation

## 2013-08-13 DIAGNOSIS — E039 Hypothyroidism, unspecified: Secondary | ICD-10-CM | POA: Insufficient documentation

## 2013-08-13 DIAGNOSIS — I5032 Chronic diastolic (congestive) heart failure: Secondary | ICD-10-CM

## 2013-08-13 DIAGNOSIS — E669 Obesity, unspecified: Secondary | ICD-10-CM | POA: Insufficient documentation

## 2013-08-13 LAB — BASIC METABOLIC PANEL
Anion gap: 19 — ABNORMAL HIGH (ref 5–15)
BUN: 24 mg/dL — ABNORMAL HIGH (ref 6–23)
CO2: 20 meq/L (ref 19–32)
Calcium: 9.4 mg/dL (ref 8.4–10.5)
Chloride: 94 mEq/L — ABNORMAL LOW (ref 96–112)
Creatinine, Ser: 0.74 mg/dL (ref 0.50–1.10)
GFR calc Af Amer: >90 mL/min (ref >90)
GFR, EST NON AFRICAN AMERICAN: 82 mL/min — AB (ref >90)
GLUCOSE: 89 mg/dL (ref 70–99)
POTASSIUM: 4.6 meq/L (ref 3.7–5.3)
SODIUM: 133 meq/L — AB (ref 137–147)

## 2013-08-13 MED ORDER — SPIRONOLACTONE 25 MG PO TABS: 25.0000 mg | ORAL_TABLET | Freq: Every day | ORAL | Status: DC

## 2013-08-13 NOTE — Patient Instructions (Addendum)
Follow up in 1 month   Do the following things EVERYDAY: 1) Weigh yourself in the morning before breakfast. Write it down and keep it in a log. 2) Take your medicines as prescribed 3) Eat low salt foods-Limit salt (sodium) to 2000 mg per day.  4) Stay as active as you can everyday 5) Limit all fluids for the day to less than 2 liters 

## 2013-08-16 ENCOUNTER — Telehealth (HOSPITAL_COMMUNITY): Payer: Self-pay | Admitting: Vascular Surgery

## 2013-08-16 NOTE — Telephone Encounter (Signed)
Junie Bame, NP spoke w/pt and instructed her to take extra medication, she will call back if wt does not come down

## 2013-08-16 NOTE — Telephone Encounter (Signed)
Pt gain 5 lbs over night . She is going to the bathroom . Please advise.

## 2013-08-21 ENCOUNTER — Telehealth (HOSPITAL_COMMUNITY): Payer: Self-pay | Admitting: Vascular Surgery

## 2013-08-21 DIAGNOSIS — I509 Heart failure, unspecified: Secondary | ICD-10-CM

## 2013-08-21 MED ORDER — METOLAZONE 2.5 MG PO TABS: ORAL_TABLET | ORAL | Status: DC

## 2013-08-21 NOTE — Telephone Encounter (Signed)
Spoke with pt regarding weight increase Normally 173 lb, today weight 186, last office visit 181 Denies increased SOB, CP Facial edema and mild thigh swelling Increased cough  Pt states she is currently taking torsemide 60 BID as instructed by Darrick Grinder, NP at last office visit    Per VO Junie Bame, NP Add Metolazone 2.5 mg three times per week Continue torsemide 60 BID Continue Potassium 40 four times per day BMET before the end of the week or early next week

## 2013-08-21 NOTE — Telephone Encounter (Signed)
Weight going up.. 170 3 week a go.. Today 186, PT wants to change her Torsemide. Please advise

## 2013-08-21 NOTE — Telephone Encounter (Signed)
Pt aware and voiced understanding Order sent to [facility] [place] for BMET

## 2013-08-28 ENCOUNTER — Other Ambulatory Visit: Payer: Self-pay

## 2013-08-28 ENCOUNTER — Encounter (HOSPITAL_COMMUNITY): Payer: Self-pay | Admitting: Emergency Medicine

## 2013-08-28 ENCOUNTER — Emergency Department (HOSPITAL_COMMUNITY): Payer: Medicare Other

## 2013-08-28 ENCOUNTER — Inpatient Hospital Stay (HOSPITAL_COMMUNITY)
Admission: EM | Admit: 2013-08-28 | Discharge: 2013-09-07 | DRG: 377 | Disposition: A | Discharge status: Home / Self Care | Payer: Medicare Other | Attending: Internal Medicine | Admitting: Internal Medicine

## 2013-08-28 ENCOUNTER — Ambulatory Visit (HOSPITAL_COMMUNITY)
Admission: RE | Admit: 2013-08-28 | Discharge: 2013-08-28 | Disposition: A | Discharge status: Home / Self Care | Payer: Medicare Other | Source: Ambulatory Visit | Attending: Cardiology | Admitting: Cardiology

## 2013-08-28 ENCOUNTER — Encounter (HOSPITAL_COMMUNITY): Payer: Self-pay | Admitting: Adult Health

## 2013-08-28 DIAGNOSIS — E876 Hypokalemia: Secondary | ICD-10-CM | POA: Diagnosis present

## 2013-08-28 DIAGNOSIS — Y831 Surgical operation with implant of artificial internal device as the cause of abnormal reaction of the patient, or of later complication, without mention of misadventure at the time of the procedure: Secondary | ICD-10-CM | POA: Diagnosis present

## 2013-08-28 DIAGNOSIS — I4510 Unspecified right bundle-branch block: Secondary | ICD-10-CM | POA: Diagnosis present

## 2013-08-28 DIAGNOSIS — I050 Rheumatic mitral stenosis: Secondary | ICD-10-CM

## 2013-08-28 DIAGNOSIS — K5731 Diverticulosis of large intestine without perforation or abscess with bleeding: Secondary | ICD-10-CM | POA: Diagnosis present

## 2013-08-28 DIAGNOSIS — D696 Thrombocytopenia, unspecified: Secondary | ICD-10-CM | POA: Diagnosis present

## 2013-08-28 DIAGNOSIS — I5032 Chronic diastolic (congestive) heart failure: Secondary | ICD-10-CM

## 2013-08-28 DIAGNOSIS — E890 Postprocedural hypothyroidism: Secondary | ICD-10-CM | POA: Diagnosis present

## 2013-08-28 DIAGNOSIS — Z87891 Personal history of nicotine dependence: Secondary | ICD-10-CM

## 2013-08-28 DIAGNOSIS — N179 Acute kidney failure, unspecified: Secondary | ICD-10-CM | POA: Diagnosis present

## 2013-08-28 DIAGNOSIS — I129 Hypertensive chronic kidney disease with stage 1 through stage 4 chronic kidney disease, or unspecified chronic kidney disease: Secondary | ICD-10-CM | POA: Diagnosis present

## 2013-08-28 DIAGNOSIS — Z954 Presence of other heart-valve replacement: Secondary | ICD-10-CM

## 2013-08-28 DIAGNOSIS — D62 Acute posthemorrhagic anemia: Secondary | ICD-10-CM

## 2013-08-28 DIAGNOSIS — K7460 Unspecified cirrhosis of liver: Secondary | ICD-10-CM | POA: Diagnosis present

## 2013-08-28 DIAGNOSIS — T82897A Other specified complication of cardiac prosthetic devices, implants and grafts, initial encounter: Secondary | ICD-10-CM | POA: Diagnosis present

## 2013-08-28 DIAGNOSIS — N182 Chronic kidney disease, stage 2 (mild): Secondary | ICD-10-CM | POA: Diagnosis present

## 2013-08-28 DIAGNOSIS — D500 Iron deficiency anemia secondary to blood loss (chronic): Secondary | ICD-10-CM | POA: Diagnosis present

## 2013-08-28 DIAGNOSIS — K922 Gastrointestinal hemorrhage, unspecified: Secondary | ICD-10-CM

## 2013-08-28 DIAGNOSIS — I509 Heart failure, unspecified: Secondary | ICD-10-CM | POA: Diagnosis present

## 2013-08-28 DIAGNOSIS — I080 Rheumatic disorders of both mitral and aortic valves: Secondary | ICD-10-CM | POA: Diagnosis present

## 2013-08-28 DIAGNOSIS — I5033 Acute on chronic diastolic (congestive) heart failure: Secondary | ICD-10-CM | POA: Diagnosis present

## 2013-08-28 DIAGNOSIS — D649 Anemia, unspecified: Secondary | ICD-10-CM | POA: Diagnosis present

## 2013-08-28 DIAGNOSIS — E871 Hypo-osmolality and hyponatremia: Secondary | ICD-10-CM | POA: Diagnosis present

## 2013-08-28 DIAGNOSIS — Z8679 Personal history of other diseases of the circulatory system: Secondary | ICD-10-CM

## 2013-08-28 DIAGNOSIS — I350 Nonrheumatic aortic (valve) stenosis: Secondary | ICD-10-CM

## 2013-08-28 DIAGNOSIS — Z952 Presence of prosthetic heart valve: Secondary | ICD-10-CM

## 2013-08-28 DIAGNOSIS — K5900 Constipation, unspecified: Secondary | ICD-10-CM | POA: Diagnosis not present

## 2013-08-28 DIAGNOSIS — I959 Hypotension, unspecified: Secondary | ICD-10-CM | POA: Diagnosis present

## 2013-08-28 HISTORY — DX: Heart failure, unspecified: I50.9

## 2013-08-28 LAB — BASIC METABOLIC PANEL
ANION GAP: 18 — AB (ref 5–15)
Anion gap: 20 — ABNORMAL HIGH (ref 5–15)
Anion gap: 15 (ref 5–15)
BUN: 36 mg/dL — ABNORMAL HIGH (ref 6–23)
BUN: 36 mg/dL — ABNORMAL HIGH (ref 6–23)
BUN: 36 mg/dL — ABNORMAL HIGH (ref 6–23)
CALCIUM: 8.6 mg/dL (ref 8.4–10.5)
CALCIUM: 8.4 mg/dL (ref 8.4–10.5)
CO2: 22 meq/L (ref 19–32)
CO2: 24 mEq/L (ref 19–32)
CO2: 24 mEq/L (ref 19–32)
CREATININE: 1.33 mg/dL — AB (ref 0.50–1.10)
Calcium: 8.4 mg/dL (ref 8.4–10.5)
Chloride: 85 mEq/L — ABNORMAL LOW (ref 96–112)
Chloride: 88 mEq/L — ABNORMAL LOW (ref 96–112)
Chloride: 86 mEq/L — ABNORMAL LOW (ref 96–112)
Creatinine, Ser: 1.42 mg/dL — ABNORMAL HIGH (ref 0.50–1.10)
Creatinine, Ser: 1.45 mg/dL — ABNORMAL HIGH (ref 0.50–1.10)
GFR calc Af Amer: 41 mL/min — ABNORMAL LOW (ref >90)
GFR calc Af Amer: 45 mL/min — ABNORMAL LOW (ref >90)
GFR, EST AFRICAN AMERICAN: 40 mL/min — AB (ref >90)
GFR, EST NON AFRICAN AMERICAN: 36 mL/min — AB (ref >90)
GFR, EST NON AFRICAN AMERICAN: 35 mL/min — AB (ref >90)
GFR, EST NON AFRICAN AMERICAN: 39 mL/min — AB (ref >90)
GLUCOSE: 114 mg/dL — AB (ref 70–99)
GLUCOSE: 102 mg/dL — AB (ref 70–99)
Glucose, Bld: 105 mg/dL — ABNORMAL HIGH (ref 70–99)
POTASSIUM: 3.1 meq/L — AB (ref 3.7–5.3)
Potassium: 2.8 mEq/L — CL (ref 3.7–5.3)
Potassium: 3.3 mEq/L — ABNORMAL LOW (ref 3.7–5.3)
SODIUM: 127 meq/L — AB (ref 137–147)
Sodium: 127 mEq/L — ABNORMAL LOW (ref 137–147)
Sodium: 128 mEq/L — ABNORMAL LOW (ref 137–147)

## 2013-08-28 LAB — HEPATIC FUNCTION PANEL
ALT: 14 U/L (ref 0–35)
AST: 27 U/L (ref 0–37)
Albumin: 3.2 g/dL — ABNORMAL LOW (ref 3.5–5.2)
Alkaline Phosphatase: 104 U/L (ref 39–117)
BILIRUBIN DIRECT: 0.3 mg/dL (ref 0.0–0.3)
Indirect Bilirubin: 0.5 mg/dL (ref 0.3–0.9)
Total Bilirubin: 0.8 mg/dL (ref 0.3–1.2)
Total Protein: 6.6 g/dL (ref 6.0–8.3)

## 2013-08-28 LAB — CBC
HEMATOCRIT: 18.1 % — AB (ref 36.0–46.0)
Hemoglobin: 5.5 g/dL — CL (ref 12.0–15.0)
MCH: 29.6 pg (ref 26.0–34.0)
MCHC: 30.4 g/dL (ref 30.0–36.0)
MCV: 97.3 fL (ref 78.0–100.0)
Platelets: 154 10*3/uL (ref 150–400)
RBC: 1.86 MIL/uL — ABNORMAL LOW (ref 3.87–5.11)
RDW: 17.3 % — AB (ref 11.5–15.5)
WBC: 4.3 10*3/uL (ref 4.0–10.5)

## 2013-08-28 LAB — PRO B NATRIURETIC PEPTIDE: Pro B Natriuretic peptide (BNP): 2110 pg/mL — ABNORMAL HIGH (ref 0–125)

## 2013-08-28 LAB — I-STAT TROPONIN, ED: TROPONIN I, POC: 0.02 ng/mL (ref 0.00–0.08)

## 2013-08-28 LAB — PREPARE RBC (CROSSMATCH)

## 2013-08-28 LAB — MRSA PCR SCREENING: MRSA by PCR: NEGATIVE

## 2013-08-28 MED ORDER — FUROSEMIDE 10 MG/ML IJ SOLN
80.0000 mg | Freq: Two times a day (BID) | INTRAMUSCULAR | Status: DC
  Administered 2013-08-28 – 2013-08-29 (×2): 80 mg via INTRAVENOUS
  Filled 2013-08-28 (×5): qty 8

## 2013-08-28 MED ORDER — SODIUM CHLORIDE 0.9 % IV SOLN
1000.0000 mL | INTRAVENOUS | Status: DC
  Administered 2013-08-28: 500 mL via INTRAVENOUS

## 2013-08-28 MED ORDER — ONDANSETRON HCL 4 MG/2ML IJ SOLN: 4.0000 mg | Freq: Four times a day (QID) | INTRAMUSCULAR | Status: DC | PRN

## 2013-08-28 MED ORDER — VANCOMYCIN HCL IN DEXTROSE 1-5 GM/200ML-% IV SOLN
1000.0000 mg | Freq: Once | INTRAVENOUS | Status: DC
  Administered 2013-08-28: 1000 mg via INTRAVENOUS
  Filled 2013-08-28: qty 200

## 2013-08-28 MED ORDER — POTASSIUM CHLORIDE 10 MEQ/100ML IV SOLN: 10.0000 meq | INTRAVENOUS | Status: DC

## 2013-08-28 MED ORDER — POTASSIUM CHLORIDE 10 MEQ/100ML IV SOLN
10.0000 meq | INTRAVENOUS | Status: DC
Start: 2013-08-28 — End: 2013-08-28

## 2013-08-28 MED ORDER — POTASSIUM CHLORIDE CRYS ER 20 MEQ PO TBCR
40.0000 meq | EXTENDED_RELEASE_TABLET | Freq: Once | ORAL | Status: AC
  Administered 2013-08-28: 40 meq via ORAL
  Filled 2013-08-28: qty 2

## 2013-08-28 MED ORDER — DEXTROSE 5 % IV SOLN
2.0000 g | Freq: Once | INTRAVENOUS | Status: DC
  Filled 2013-08-28: qty 2

## 2013-08-28 MED ORDER — POTASSIUM CHLORIDE 10 MEQ/100ML IV SOLN
10.0000 meq | INTRAVENOUS | Status: AC
  Administered 2013-08-28: 10 meq via INTRAVENOUS
  Filled 2013-08-28 (×2): qty 100

## 2013-08-28 MED ORDER — ONDANSETRON HCL 4 MG PO TABS: 4.0000 mg | ORAL_TABLET | Freq: Four times a day (QID) | ORAL | Status: DC | PRN

## 2013-08-28 MED ORDER — ACETAMINOPHEN 500 MG PO TABS
500.0000 mg | ORAL_TABLET | Freq: Once | ORAL | Status: AC
  Administered 2013-08-28: 500 mg via ORAL
  Filled 2013-08-28: qty 1

## 2013-08-28 MED ORDER — SODIUM CHLORIDE 0.9 % IV BOLUS (SEPSIS): 500.0000 mL | Freq: Once | INTRAVENOUS | Status: DC

## 2013-08-28 MED ORDER — SODIUM CHLORIDE 0.9 % IJ SOLN
3.0000 mL | Freq: Two times a day (BID) | INTRAMUSCULAR | Status: DC
  Administered 2013-08-28 – 2013-09-07 (×15): 3 mL via INTRAVENOUS

## 2013-08-28 NOTE — Addendum Note (Signed)
Encounter addended by: Renee Pain, RN on: 08/28/2013 12:28 PM<BR>     Documentation filed: Patient Instructions Section

## 2013-08-28 NOTE — Patient Instructions (Signed)
Patient in for routine lab work, c/o dizziness and fatigue.  Orthostatics sitting 104/46, standing 112/52.  Amy Clegg made aware.   Sent to ED for rectal bleeding.

## 2013-08-28 NOTE — Consult Note (Signed)
Referring Provider: No ref. provider found Primary Care Physician:  Rubie Maid, MD Primary Gastroenterologist:  Dr. Colon Branch, [facility]  Reason for Consultation:  Anemia, GI bleeding  HPI: [place] is a 73 y.o. 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  Admitted several times recently to [facility] with volume overload/CHF.  Follows with heart failure clinic.  She went to the HF clinic today for lab work and was complaining of fatigue and rectal bleeding.  Was sent to the ED for evaluation.  Hgb was 5.5 grams upon arrival to ED today.  Has apparently been transfused with 4 units PRBC's within the past 1-2 months.  Upper and lower endoscopy reportedly with a couple of polyps removed but otherwise normal by Dr. Colon Branch in [facility].  Patient thinks that these were performed within the past 3 weeks but other reports say 6 weeks.  Dr. Dina Rich has requested the patient's full GI records from Dr. Dorrene German in [place].    Patient tells me that she has been having intermittent rectal bleeding for 2 weeks.  Last night/this AM was the worst.  She says that she had a significant amount of blood in the toilet bowl and on the floor/rug in the bathroom.  She has been on iron supplements for several months so her stools have been darker colored from that, but this was red blood.  No BM since that occurred at home.  She says that the bleeding is new and she did not have it previously; says that the GI procedures were performed due to anemia, not bleeding.  Denies abdominal pain.  Says that she also thought that she was having vaginal bleeding.  She is not on blood thinners.     Past Medical History  Diagnosis Date  . CHF (congestive heart failure)   . Thyroid disease   . Neuropathy     History reviewed. No pertinent past surgical history.  Prior to Admission medications     Medication Sig Start Date End Date Taking? Authorizing Provider  acetaminophen (TYLENOL) 325 MG tablet Take 650 mg by mouth every 6 (six) hours as needed (pain).    Yes Historical Provider, MD  Dermatological Products, Misc. (MOISTURE) CREA Apply 1 application topically at bedtime.   Yes Historical Provider, MD  ferrous sulfate 325 (65 FE) MG tablet Take 325 mg by mouth 3 (three) times daily with meals.    Yes Historical Provider, MD  gabapentin (NEURONTIN) 300 MG capsule Take 300 mg by mouth 2 (two) times daily.  05/31/13  Yes Historical Provider, MD  levothyroxine (SYNTHROID, LEVOTHROID) 200 MCG tablet Take 1 tablet (200 mcg total) by mouth daily before breakfast. 07/26/13  Yes Barton Dubois, MD  LORazepam (ATIVAN) 1 MG tablet Take 1 mg by mouth at bedtime as needed for anxiety or sleep. sleep 06/01/13  Yes Historical Provider, MD  metolazone (ZAROXOLYN) 2.5 MG tablet Take 2.5 mg by mouth 3 (three) times a week. On Tuesday, Thursday and Saturday.   Yes Historical Provider, MD  potassium chloride SA (K-DUR,KLOR-CON) 20 MEQ tablet Take 20 mEq by mouth 3 (three) times daily.  07/02/13  Yes Historical Provider, MD  spironolactone (ALDACTONE) 25 MG tablet Take 1 tablet (25 mg total) by mouth daily. 08/13/13  Yes Amy D Clegg, NP  torsemide (DEMADEX) 20 MG tablet Take 60 mg by mouth 2 (two) times daily.   Yes Historical Provider, MD  Current Facility-Administered Medications  Medication Dose Route Frequency Provider Last Rate Last Dose  . 0.9 %  sodium chloride infusion  1,000 mL Intravenous Continuous Garald Balding, NP      . aztreonam (AZACTAM) 2 g in dextrose 5 % 50 mL IVPB  2 g Intravenous Once Garald Balding, NP      . potassium chloride 10 mEq in 100 mL IVPB  10 mEq Intravenous Q1 Hr x 2 Merryl Hacker, MD 100 mL/hr at 08/28/13 1445 10 mEq at 08/28/13 1445  . vancomycin (VANCOCIN) IVPB 1000 mg/200 mL premix  1,000 mg Intravenous Once Garald Balding, NP       Current Outpatient Prescriptions   Medication Sig Dispense Refill  . acetaminophen (TYLENOL) 325 MG tablet Take 650 mg by mouth every 6 (six) hours as needed (pain).       . Dermatological Products, Misc. (MOISTURE) CREA Apply 1 application topically at bedtime.      . ferrous sulfate 325 (65 FE) MG tablet Take 325 mg by mouth 3 (three) times daily with meals.       . gabapentin (NEURONTIN) 300 MG capsule Take 300 mg by mouth 2 (two) times daily.       Marland Kitchen levothyroxine (SYNTHROID, LEVOTHROID) 200 MCG tablet Take 1 tablet (200 mcg total) by mouth daily before breakfast.  30 tablet  1  . LORazepam (ATIVAN) 1 MG tablet Take 1 mg by mouth at bedtime as needed for anxiety or sleep. sleep      . metolazone (ZAROXOLYN) 2.5 MG tablet Take 2.5 mg by mouth 3 (three) times a week. On Tuesday, Thursday and Saturday.      . potassium chloride SA (K-DUR,KLOR-CON) 20 MEQ tablet Take 20 mEq by mouth 3 (three) times daily.       Marland Kitchen spironolactone (ALDACTONE) 25 MG tablet Take 1 tablet (25 mg total) by mouth daily.  30 tablet  6  . torsemide (DEMADEX) 20 MG tablet Take 60 mg by mouth 2 (two) times daily.        Allergies as of 08/28/2013 - Review Complete 08/28/2013  Allergen Reaction Noted  . Pineapple Other (See Comments) 07/17/2013  . Penicillins Rash 07/09/2013    History reviewed. No pertinent family history.  History   Social History  . Marital Status: Married    Spouse Name: N/A    Number of Children: N/A  . Years of Education: N/A   Occupational History  . Not on file.   Social History Main Topics  . Smoking status: Former Research scientist (life sciences)  . Smokeless tobacco: Former Systems developer    Quit date: 07/10/1984  . Alcohol Use: No  . Drug Use: No  . Sexual Activity: Not on file   Other Topics Concern  . Not on file   Social History Narrative  . No narrative on file    Review of Systems: Ten point ROS is O/W negative except as mentioned in HPI.  Physical Exam: Vital signs in last 24 hours: Temp:  [97.9 F (36.6 C)] 97.9 F (36.6 C)  (07/21 1242) Pulse Rate:  [81-85] 84 (07/21 1500) Resp:  [10-19] 16 (07/21 1500) BP: (99-117)/(16-55) 103/53 mmHg (07/21 1500) SpO2:  [98 %-100 %] 100 % (07/21 1500)   General:  Alert, Well-developed, well-nourished, pleasant and cooperative in NAD Head:  Normocephalic and atraumatic. Eyes:  Sclera clear, no icterus.  Conjunctiva pale. Ears:  Normal auditory acuity. Mouth:  No deformity or lesions.   Lungs:  Clear throughout to auscultation.  No wheezes, crackles, or rhonchi.  Heart:  Regular rate and rhythm. Abdomen:  Soft, non-distended.  BS present.  Non-tender.   Rectal:  Large hemorrhoids noted, some appearing irritated and as if there was recent bleeding.  Brown stool on exam glove with no gross blood but was strongly heme positive. Msk:  Symmetrical without gross deformities. Pulses:  Normal pulses noted. Extremities:  Tight brawny edema noted in B/L LE's. Neurologic:  Alert and oriented x4;  grossly normal neurologically. Skin:  Intact without significant lesions or rashes. Psych:  Alert and cooperative. Normal mood and affect.  Lab Results:  Recent Labs  08/28/13 1330  WBC 4.3  HGB 5.5*  HCT 18.1*  PLT 154   BMET  Recent Labs  08/28/13 1157 08/28/13 1330  NA 127* 127*  K 2.8* 3.1*  CL 85* 88*  CO2 22 24  GLUCOSE 114* 102*  BUN 36* 36*  CREATININE 1.42* 1.45*  CALCIUM 8.4 8.6   Studies/Results: Dg Chest 2 View  08/28/2013   CLINICAL DATA:  Weakness.  EXAM: CHEST  2 VIEW  COMPARISON:  CT 07/10/2013.  Chest x-ray 07/09/2013.  FINDINGS: Mediastinum and hilar structures normal. Prior CABG. Prior aortic valve replacement. Cardiomegaly with pulmonary vascular prominence and interstitial prominence with bilateral pleural effusions right side greater than left. Findings consistent with congestive heart failure. No pneumothorax. No acute bony abnormality.  IMPRESSION: 1. Congestive heart failure with pulmonary interstitial edema and pleural effusions.  2.  Prior CABG.   Aortic valve replacement.   Electronically Signed   By: Marcello Moores  Register   On: 08/28/2013 13:29    IMPRESSION:  -GI bleeding/heme positive stool:  Recent EGD/colonoscopy within past 3-6 weeks.  Records have been requested.  No active gross bleeding currently.  ? Hemorrhoidal vs diverticular bleeding.  -Acute on chronic anemia, acutely secondary to blood loss anemia:  Hgb 5.5 grams down from 8.5 grams one month ago. -CHF -ARF -S/P AVR with bovine valve 5 years ago  PLAN: -Transfuse with PRBC's according to heart failure team's recommendations. -Await records from [place] [facility]. -If re-bleeds briskly then may need to consider nuclear medicine bleeding scan. -? If she should have a pelvic exam for possible vaginal bleeding as well.   ZEHR, JESSICA D.  08/28/2013, 3:24 PM  Pager number [phone]     Attending physician's note   I have taken a history, examined the patient and reviewed the chart. I agree with the Advanced Practitioner's note, impression and recommendations. Rectal bleeding and possible vaginal bleeding. Primary service should arrange a pelvic exam to further evaluate. Transfusions to Hb > 9. Await records from recent colonoscopy and EGD by Dr. Dorrene German. If she has recurrent, brisk GI bleeding would proceed with a nuclear tagged RBC scan and then angiogram as indicated.   Ladene Artist, MD Marval Regal

## 2013-08-28 NOTE — ED Notes (Signed)
Pt continues to be monitored by 5 lead, blood pressure, and pulse ox.  

## 2013-08-28 NOTE — ED Notes (Signed)
BED RE-ASSIGNED

## 2013-08-28 NOTE — ED Provider Notes (Signed)
I saw and evaluated the patient, reviewed the resident's note and I agree with the findings and plan.   EKG Interpretation   Date/Time:  Tuesday August 28 2013 13:04:11 EDT Ventricular Rate:  84 PR Interval:  220 QRS Duration: 156 QT Interval:  468 QTC Calculation: 553 R Axis:   136 Text Interpretation:  Sinus rhythm with 1st degree A-V block Right bundle  branch block Abnormal ECG Similar to prior Confirmed by Elysse Polidore  MD,  [place] ([id number]) on 08/28/2013 1:15:33 PM      See additional note.  Merryl Hacker, MD 08/28/13 2204

## 2013-08-28 NOTE — Progress Notes (Signed)
Patient ID: [place], female   DOB: 1940-12-13, 73 y.o.   MRN: [id number] She returned to HF clinic today for lab work only and is complaining of fatigue and BRBPR.  Daughter says it is a significant amount of blood saturating sheets and rug. Says she is having spontaneous bleeding from her rectum.   Sent to [facility] ED for evaluation.   Zohal Reny NP-C  12:13 PM

## 2013-08-28 NOTE — ED Notes (Signed)
Pt placed into gown and on monitor upon arrival to room. Pt monitored by 5 lead, blood pressure, and pulse ox.  

## 2013-08-28 NOTE — H&P (Signed)
Date: 08/28/2013               Patient Name:  Nicole Hansen MRN: [id number]  DOB: Jan 11, 1941 Age / Sex: 73 y.o., female   PCP: Rubie Maid, MD         Medical Service: Internal Medicine Teaching Service         Attending Physician: Dr. Karren Cobble, MD    First Contact: Dr. Hulen Luster Pager: [phone]  Second Contact: Dr. Eula Fried Pager: [phone]       After Hours (After 5p/  First Contact Pager: [phone]  weekends / holidays): Second Contact Pager: [phone]   Chief Complaint: BRBPR  History of Present Illness: Pt is a 73 y/o F w/ hx of aortic replacement (bovine), dCHF, hemorrhoids, hypothyroidism s/p thyroidectomy, and HTN who presents with BRBPR.  Starting last night while urinating she noticed BRBPR that was heavy x 10 minutes. Since then she has noticed constant dripping of blood to the point it saturated her rug. Pt is on iron supplementation at home for her anemia and has constipation from this as well as black stools. Her last BM was 5 days ago. She has hemorrhoids that bleed occasionally when she has a hard stool. Pt denies SOB, abdominal pain or chest pain. She does endorse dizziness, weakness, and fatigue. She had a colonoscopy and an EGD in [place] for anemia w/ Dr. Colon Branch that she believes was normal. Per HF clinic note EGD and colo was negative for bleeding. Pt has chronically low blood pressures around the 90's/50's. She denies any liver disease.   Pt reports increased swelling in feet and face. She was last admitted in June for volume overload at which time 42 lbs was diuresed and her d/c weight was 173 lbs. She is currently taking torsemide 40mg  in the am and 20mg  in the pm.   Meds: Current Facility-Administered Medications  Medication Dose Route Frequency Provider Last Rate Last Dose  . furosemide (LASIX) injection 80 mg  80 mg Intravenous BID Otho Bellows, MD   80 mg at 08/28/13 1718  . ondansetron (ZOFRAN) tablet 4 mg  4 mg Oral Q6H PRN Otho Bellows, MD        Or  . ondansetron [facility]) injection 4 mg  4 mg Intravenous Q6H PRN Otho Bellows, MD      . sodium chloride 0.9 % injection 3 mL  3 mL Intravenous Q12H Otho Bellows, MD        Allergies: Allergies as of 08/28/2013 - Review Complete 08/28/2013  Allergen Reaction Noted  . Pineapple Other (See Comments) 07/17/2013  . Penicillins Rash 07/09/2013   Past Medical History  Diagnosis Date  . CHF (congestive heart failure)   . Thyroid disease   . Neuropathy    History reviewed. No pertinent past surgical history. History reviewed. No pertinent family history. History   Social History  . Marital Status: Married    Spouse Name: N/A    Number of Children: N/A  . Years of Education: N/A   Occupational History  . Not on file.   Social History Main Topics  . Smoking status: Former Research scientist (life sciences)  . Smokeless tobacco: Former Systems developer    Quit date: 07/10/1984  . Alcohol Use: No  . Drug Use: No  . Sexual Activity: Not on file   Other Topics Concern  . Not on file   Social History Narrative  . No narrative on file    Review of Systems: Constitutional: positive  for light headness, dizziness, weakness, and nausea.   Respiratory:negative for SOB Cardiovascular: negative for chest pain Gastrointestinal: as noted in HPI above  Physical Exam: Blood pressure 111/57, pulse 87, temperature 97.8 F (36.6 C), temperature source Oral, resp. rate 14, SpO2 98.00%. BP 111/57  Pulse 87  Temp(Src) 97.8 F (36.6 C) (Oral)  Resp 14  SpO2 98%  General Appearance:    Alert, appears fatigued  Head:    Normocephalic, without obvious abnormality, atraumatic  Eyes:    EOM's intact  Lungs:     Clear to auscultation bilaterally, respirations unlabored   Heart:    Regular rate and rhythm, systolic murmur at rt sternal border  Abdomen:     Soft, non-tender, bowel sounds active all four quadrants  Rectal:  External hemorrhoids noted, no BRBPR, minimal blood noted on pt's pad that she changed this  morning.   Extremities:  2+ firm pitting edema up to thigh, nonweeping pustules, normal capillary refill  Neurologic:   CNII-XII intact, normal strength, sensation and reflexes    throughout    Lab results: Basic Metabolic Panel:  Recent Labs  08/28/13 1157 08/28/13 1330  NA 127* 127*  K 2.8* 3.1*  CL 85* 88*  CO2 22 24  GLUCOSE 114* 102*  BUN 36* 36*  CREATININE 1.42* 1.45*  CALCIUM 8.4 8.6   Liver Function Tests:  Recent Labs  08/28/13 1330  AST 27  ALT 14  ALKPHOS 104  BILITOT 0.8  PROT 6.6  ALBUMIN 3.2*   CBC:  Recent Labs  08/28/13 1330  WBC 4.3  HGB 5.5*  HCT 18.1*  MCV 97.3  PLT 154   BNP:  Recent Labs  08/28/13 1330  PROBNP 2110.0*     Imaging results:  Dg Chest 2 View  08/28/2013   CLINICAL DATA:  Weakness.  EXAM: CHEST  2 VIEW  COMPARISON:  CT 07/10/2013.  Chest x-ray 07/09/2013.  FINDINGS: Mediastinum and hilar structures normal. Prior CABG. Prior aortic valve replacement. Cardiomegaly with pulmonary vascular prominence and interstitial prominence with bilateral pleural effusions right side greater than left. Findings consistent with congestive heart failure. No pneumothorax. No acute bony abnormality.  IMPRESSION: 1. Congestive heart failure with pulmonary interstitial edema and pleural effusions.  2.  Prior CABG.  Aortic valve replacement.   Electronically Signed   By: Marcello Moores  Register   On: 08/28/2013 13:29    Other results: EKG: sinus rhythm w/ 1st degree AV block w/ rt bundle branch block.  Assessment & Plan by Problem: Principal Problem:   Acute GI bleeding Active Problems:   Hypotension   Hypokalemia   Acute on chronic diastolic CHF (congestive heart failure)   AKI (acute kidney injury)   Acute posthemorrhagic anemia   BRBPR --CT angio 07/10/13 found lobulated liver contour and probable splenomegaly which suggest cirrhosis. Although pt having soft pressures, ?cirrhosis could be causing BRBPR by contributing to hemorrhoids. Pt has  had to have 4 Units of blood transfused w/in the past 1-2 months. Pt had colo and EGD for anemia last month in [place] that was per Epic notes negative for bleeding. - admit to step down  - type and screen, will transfuse when hgb <9 - Will get [facility].  - Hgb 5.5, baseline is 8.1, CBC q6h -repeat BMP tonight s/p 1unit blood transfusion - hepatic panel and LFTs - will get daily orthostatic vitals  Acute on chronic diastolic HF: ECHO on 6/2 EF 55%, moderate mitral stenosis, mild aortic stenosis. CXR-  CHF w/ pulmonary interstitial edema and pleural effusions. BNP on admission 2110, on 07/24/13 was 837.4. On PE pt has 2+ firm pitting edema up to the thighs w/ facial swelling. - consulted HF due to tenuous volume status, they will see patient in the am - will diurese w/ 80mg  IV lasix  - pt states her weight was 186 lbs this am at home, current weight 191lbs, will continue to monitor weights and get I/Os  AKI --2/2 diuretics and hypovolema. Cr on admission 1.42, baseline around 0.80 - will check BMET tomorrow   Hyponatremia-- 2/2 to diuretics or HF. Na on admission 127, normally runs low in the 130's - will monitor BMET  Hypokalemia  - Pt given 20 mEq of KCl w/ fluids  DVT: SCD's  Dispo: Disposition is deferred at this time, awaiting improvement of current medical problems.    The patient does have a current PCP Rubie Maid, MD) and does need an [facility] hospital follow-up appointment after discharge.  The patient does not have transportation limitations that hinder transportation to clinic appointments.  Signed: Julious Oka, MD 08/28/2013, 7:57 PM

## 2013-08-28 NOTE — ED Notes (Addendum)
Pt currently in xray will be brought back to room by transport

## 2013-08-28 NOTE — ED Notes (Signed)
Pt reports hx of chf, taking all meds as prescribed. Was started on new med last week and progressively feeling worse over the past week. Appears pale at triage, reports fatigue, dizziness, lightheaded, weight gain, swelling and now bleeding from rectal and vaginal area.

## 2013-08-28 NOTE — ED Notes (Signed)
CODE SEPSIS ORDERS NOT APPLICABLE TO THIS PT

## 2013-08-28 NOTE — ED Provider Notes (Signed)
I saw and evaluated the patient, reviewed the resident's note and I agree with the findings and plan.   EKG Interpretation   Date/Time:  Tuesday August 28 2013 13:04:11 EDT Ventricular Rate:  84 PR Interval:  220 QRS Duration: 156 QT Interval:  468 QTC Calculation: 553 R Axis:   136 Text Interpretation:  Sinus rhythm with 1st degree A-V block Right bundle  branch block Abnormal ECG Similar to prior Confirmed by Kihanna Kamiya  MD,  Francenia Chimenti ([id number]) on 08/28/2013 1:15:33 PM      Patient represents with acute GI bleed. Patient reports bright red blood per rectum. Worsening of the last week but reports bloody stools times one month. Recently seen at [facility] and had a colonoscopy approximately 6 weeks ago per report. Family is unsure what the report said. They prefer her to have her care here. Patient reports shortness of breath and generalized fatigue.  She also reports dizziness. She is pale on exam and sleepy but arousable. She is oriented x3. Per resident exam, patient with melena and no bright red blood per rectum.  Hemoglobin down to 5.5 from 8.5.  Of note, patient has history of heart failure and tendency towards volume overload. Recent admission for heart failure exacerbation in June. She appears to be volume overloaded on exam with 2+ edema. Chest x-ray also shows pulmonary edema and BNP elevated to 2110.  Patient was typed and screened and 2 large bore IVs were established by myself. Blood pressures have been 73-710 systolic which appears to be close to the patient's baseline. One unit of blood was ordered. Discussed with the internal medicine teaching service for admission to step down unit. On their evaluation, patient's blood pressure 95 systolic. They're concerned given patient's other medical comorbidities and would like to care to evaluate the patient.  Uncle has been placed. GI will also evaluate the patient. I have requested patient's full GI records from her doctor in [facility] Dr.  Dorrene German.  CRITICAL CARE Performed by: Thayer Jew, F   Total critical care time: 45 min  Critical care time was exclusive of separately billable procedures and treating other patients.  Critical care was necessary to treat or prevent imminent or life-threatening deterioration.  Critical care was time spent personally by me on the following activities: development of treatment plan with patient and/or surrogate as well as nursing, discussions with consultants, evaluation of patient's response to treatment, examination of patient, obtaining history from patient or surrogate, ordering and performing treatments and interventions, ordering and review of laboratory studies, ordering and review of radiographic studies, pulse oximetry and re-evaluation of patient's condition.  Angiocath insertion Performed by: Thayer Jew, F  Consent: Verbal consent obtained. Risks and benefits: risks, benefits and alternatives were discussed Time out: Immediately prior to procedure a "time out" was called to verify the correct patient, procedure, equipment, support staff and site/side marked as required.  Preparation: Patient was prepped and draped in the usual sterile fashion.  Vein Location: right antecubital, R EJ  Ultrasound Guided  Gauge: 18G x 2  Normal blood return and flush without difficulty Patient tolerance: Patient tolerated the procedure well with no immediate complications.      Merryl Hacker, MD 08/28/13 [phone]

## 2013-08-28 NOTE — ED Provider Notes (Signed)
CSN: [id number]     Arrival date & time 08/28/13  1216 History   First MD Initiated Contact with Patient 08/28/13 1315     Chief Complaint  Patient presents with  . Fatigue  . Rectal Bleeding     (Consider location/radiation/quality/duration/timing/severity/associated sxs/prior Treatment) HPI  Patient is 73 yo with hx of AI s/p AVR, diastolic CHF, peripheral edema, anemia, recent colonscopy 1 month ago who comes in with rectal bleeding. She has hx of hemorrhoids which bleeds but currently she is having blood dripping from the rectum. Her stool is black usually from iron supplements per patient. No changes in stool color. She sees the blood dripping even without bowel movements. Denies any abdominal pain. Denies any hemoptysis. Last colonoscopy was done by Dr. Colon Branch ([facility]) 1 month ago along with EGD. It was normal per daughter. Her baseline Hgb is 8.1.  She has been feeling dizzy and fatigued. Has been having swelling of both legs. She was recently started on metalozone, in addition to her spirnolactone and torsemide. Her swelling is not improving. She feels her face is swollen. Denies any SOB or chest pain. Denies fever/chills. Not on any blood thinners.   Past Medical History  Diagnosis Date  . CHF (congestive heart failure)   . Thyroid disease   . Neuropathy    History reviewed. No pertinent past surgical history. History reviewed. No pertinent family history. History  Substance Use Topics  . Smoking status: Former Research scientist (life sciences)  . Smokeless tobacco: Former Systems developer    Quit date: 07/10/1984  . Alcohol Use: No   OB History   Grav Para Term Preterm Abortions TAB SAB Ect Mult Living                 Review of Systems  Constitutional: Positive for appetite change and fatigue. Negative for fever and chills.  HENT: Positive for facial swelling. Negative for congestion, ear pain, hearing loss, nosebleeds, rhinorrhea, sinus pressure and tinnitus.   Eyes: Negative.    Respiratory: Negative for cough, chest tightness, shortness of breath and wheezing.   Cardiovascular: Positive for leg swelling. Negative for chest pain and palpitations.  Gastrointestinal: Positive for constipation and anal bleeding. Negative for nausea, vomiting, abdominal pain, diarrhea, blood in stool and abdominal distention.  Endocrine: Negative.   Genitourinary: Negative.   Musculoskeletal: Negative for back pain, joint swelling, myalgias and neck pain.  Skin: Positive for pallor. Negative for rash.  Allergic/Immunologic: Negative.   Neurological: Positive for dizziness and light-headedness. Negative for tremors, seizures, syncope, facial asymmetry, speech difficulty, weakness and numbness.  Hematological: Negative.   Psychiatric/Behavioral: Negative.       Allergies  Pineapple and Penicillins  Home Medications   Prior to Admission medications   Medication Sig Start Date End Date Taking? Authorizing Provider  acetaminophen (TYLENOL) 325 MG tablet Take 650 mg by mouth every 6 (six) hours as needed (pain).    Yes Historical Provider, MD  Dermatological Products, Misc. (MOISTURE) CREA Apply 1 application topically at bedtime.   Yes Historical Provider, MD  ferrous sulfate 325 (65 FE) MG tablet Take 325 mg by mouth 3 (three) times daily with meals.    Yes Historical Provider, MD  gabapentin (NEURONTIN) 300 MG capsule Take 300 mg by mouth 2 (two) times daily.  05/31/13  Yes Historical Provider, MD  levothyroxine (SYNTHROID, LEVOTHROID) 200 MCG tablet Take 1 tablet (200 mcg total) by mouth daily before breakfast. 07/26/13  Yes Barton Dubois, MD  LORazepam (ATIVAN) 1 MG tablet Take  1 mg by mouth at bedtime as needed for anxiety or sleep. sleep 06/01/13  Yes Historical Provider, MD  metolazone (ZAROXOLYN) 2.5 MG tablet Take 2.5 mg by mouth 3 (three) times a week. On Tuesday, Thursday and Saturday.   Yes Historical Provider, MD  potassium chloride SA (K-DUR,KLOR-CON) 20 MEQ tablet Take 20  mEq by mouth 3 (three) times daily.  07/02/13  Yes Historical Provider, MD  spironolactone (ALDACTONE) 25 MG tablet Take 1 tablet (25 mg total) by mouth daily. 08/13/13  Yes Amy D Clegg, NP  torsemide (DEMADEX) 20 MG tablet Take 60 mg by mouth 2 (two) times daily.   Yes Historical Provider, MD   BP 96/51  Pulse 84  Temp(Src) 97.9 F (36.6 C) (Oral)  Resp 13  SpO2 100% Physical Exam  Constitutional: She appears well-developed and well-nourished. No distress.  HENT:  Head: Normocephalic and atraumatic.  Right Ear: External ear normal.  Left Ear: External ear normal.  Mouth appears dry.   Eyes: EOM are normal. Pupils are equal, round, and reactive to light.  Pale conjunctiva.   Neck: Normal range of motion, full passive range of motion without pain and phonation normal. Neck supple. JVD present. No spinous process tenderness and no muscular tenderness present. No edema and no erythema present. No mass and no thyromegaly present.  Cardiovascular: Normal rate, regular rhythm, S1 normal and S2 normal.   Abdominal: Soft. Normal appearance and bowel sounds are normal. She exhibits no distension, no abdominal bruit and no mass. There is no tenderness. There is no rebound and no CVA tenderness.  External hemorrhoids visible on rectal exam. There is blood stool presents in the rectum. No gross blood visible on rectal exam.  Skin: She is not diaphoretic.    ED Course  Procedures (including critical care time) Labs Review Labs Reviewed  CBC - Abnormal; Notable for the following:    RBC 1.86 (*)    Hemoglobin 5.5 (*)    HCT 18.1 (*)    RDW 17.3 (*)    All other components within normal limits  BASIC METABOLIC PANEL - Abnormal; Notable for the following:    Sodium 127 (*)    Potassium 3.1 (*)    Chloride 88 (*)    Glucose, Bld 102 (*)    BUN 36 (*)    Creatinine, Ser 1.45 (*)    GFR calc non Af Amer 35 (*)    GFR calc Af Amer 40 (*)    All other components within normal limits  PRO B  NATRIURETIC PEPTIDE - Abnormal; Notable for the following:    Pro B Natriuretic peptide (BNP) 2110.0 (*)    All other components within normal limits  HEPATIC FUNCTION PANEL - Abnormal; Notable for the following:    Albumin 3.2 (*)    All other components within normal limits  OCCULT BLOOD X 1 CARD TO LAB, STOOL  I-STAT TROPOININ, ED  TYPE AND SCREEN  PREPARE RBC (CROSSMATCH)    Imaging Review Dg Chest 2 View  08/28/2013   CLINICAL DATA:  Weakness.  EXAM: CHEST  2 VIEW  COMPARISON:  CT 07/10/2013.  Chest x-ray 07/09/2013.  FINDINGS: Mediastinum and hilar structures normal. Prior CABG. Prior aortic valve replacement. Cardiomegaly with pulmonary vascular prominence and interstitial prominence with bilateral pleural effusions right side greater than left. Findings consistent with congestive heart failure. No pneumothorax. No acute bony abnormality.  IMPRESSION: 1. Congestive heart failure with pulmonary interstitial edema and pleural effusions.  2.  Prior CABG.  Aortic valve replacement.   Electronically Signed   By: Marcello Moores  Register   On: 08/28/2013 13:29     EKG Interpretation   Date/Time:  Tuesday August 28 2013 13:04:11 EDT Ventricular Rate:  84 PR Interval:  220 QRS Duration: 156 QT Interval:  468 QTC Calculation: 553 R Axis:   136 Text Interpretation:  Sinus rhythm with 1st degree A-V block Right bundle  branch block Abnormal ECG Similar to prior Confirmed by HORTON  MD,  Loma Sousa ([id number]) on 08/28/2013 1:15:33 PM      Patient was typed and crossed. 2 large IV's placed. Hgb came back 5.5, K+ 3.1, BUN 36/crt 1.45.   BNP was 2110.   Didn't give her IVF with her HF.  Repleted K+ 31mEQ IV. Transfused 1 unit blood because of symptomatic anemia. Discussed with GI. Internal med service came to evaluate patient and was concerned about SBP being low in 80's.   Consulted critical care for possible HF decompensation with blood transfusion.    Patient's BP stable SBP 90's.   MDM    Final diagnoses:  Acute GI bleeding  History of CHF (congestive heart failure)   Patient has rectal bleed in the setting of recent colonoscopy. Has hx of anemia, currently lower than her baseline and is symptomatic. Transfused 1 unit blood. Admitted to IM teaching service step-down for GI bleed management, complicated by CHF.    Dellia Nims, MD 08/28/13 1523  Dellia Nims, MD 08/28/13 5093

## 2013-08-29 ENCOUNTER — Encounter (HOSPITAL_COMMUNITY): Payer: Self-pay | Admitting: Anesthesiology

## 2013-08-29 DIAGNOSIS — I509 Heart failure, unspecified: Secondary | ICD-10-CM

## 2013-08-29 DIAGNOSIS — I050 Rheumatic mitral stenosis: Secondary | ICD-10-CM

## 2013-08-29 DIAGNOSIS — N179 Acute kidney failure, unspecified: Secondary | ICD-10-CM

## 2013-08-29 DIAGNOSIS — I5033 Acute on chronic diastolic (congestive) heart failure: Secondary | ICD-10-CM

## 2013-08-29 LAB — CBC
HCT: 21.4 % — ABNORMAL LOW (ref 36.0–46.0)
HCT: 26.4 % — ABNORMAL LOW (ref 36.0–46.0)
HCT: 26.5 % — ABNORMAL LOW (ref 36.0–46.0)
HEMOGLOBIN: 6.8 g/dL — AB (ref 12.0–15.0)
HEMOGLOBIN: 8.4 g/dL — AB (ref 12.0–15.0)
Hemoglobin: 8.5 g/dL — ABNORMAL LOW (ref 12.0–15.0)
MCH: 29.7 pg (ref 26.0–34.0)
MCH: 30 pg (ref 26.0–34.0)
MCH: 29 pg (ref 26.0–34.0)
MCHC: 31.8 g/dL (ref 30.0–36.0)
MCHC: 32.2 g/dL (ref 30.0–36.0)
MCHC: 31.7 g/dL (ref 30.0–36.0)
MCV: 93.4 fL (ref 78.0–100.0)
MCV: 93.3 fL (ref 78.0–100.0)
MCV: 91.4 fL (ref 78.0–100.0)
PLATELETS: 125 10*3/uL — AB (ref 150–400)
PLATELETS: 121 10*3/uL — AB (ref 150–400)
Platelets: 143 10*3/uL — ABNORMAL LOW (ref 150–400)
RBC: 2.29 MIL/uL — ABNORMAL LOW (ref 3.87–5.11)
RBC: 2.83 MIL/uL — ABNORMAL LOW (ref 3.87–5.11)
RBC: 2.9 MIL/uL — ABNORMAL LOW (ref 3.87–5.11)
RDW: 17.6 % — ABNORMAL HIGH (ref 11.5–15.5)
RDW: 17.2 % — AB (ref 11.5–15.5)
RDW: 17.5 % — AB (ref 11.5–15.5)
WBC: 4.8 10*3/uL (ref 4.0–10.5)
WBC: 4.8 10*3/uL (ref 4.0–10.5)
WBC: 6.2 10*3/uL (ref 4.0–10.5)

## 2013-08-29 LAB — BASIC METABOLIC PANEL
ANION GAP: 16 — AB (ref 5–15)
BUN: 36 mg/dL — ABNORMAL HIGH (ref 6–23)
CHLORIDE: 87 meq/L — AB (ref 96–112)
CO2: 25 mEq/L (ref 19–32)
Calcium: 8.2 mg/dL — ABNORMAL LOW (ref 8.4–10.5)
Creatinine, Ser: 1.3 mg/dL — ABNORMAL HIGH (ref 0.50–1.10)
GFR calc Af Amer: 46 mL/min — ABNORMAL LOW (ref >90)
GFR calc non Af Amer: 40 mL/min — ABNORMAL LOW (ref >90)
Glucose, Bld: 93 mg/dL (ref 70–99)
Potassium: 3.1 mEq/L — ABNORMAL LOW (ref 3.7–5.3)
Sodium: 128 mEq/L — ABNORMAL LOW (ref 137–147)

## 2013-08-29 LAB — PREPARE RBC (CROSSMATCH)

## 2013-08-29 MED ORDER — METOLAZONE 2.5 MG PO TABS
2.5000 mg | ORAL_TABLET | Freq: Every day | ORAL | Status: DC
  Administered 2013-08-29: 2.5 mg via ORAL
  Filled 2013-08-29 (×2): qty 1

## 2013-08-29 MED ORDER — POTASSIUM CHLORIDE CRYS ER 20 MEQ PO TBCR
40.0000 meq | EXTENDED_RELEASE_TABLET | Freq: Two times a day (BID) | ORAL | Status: DC
  Administered 2013-08-29 – 2013-08-31 (×5): 40 meq via ORAL
  Filled 2013-08-29: qty 2
  Filled 2013-08-29: qty 4
  Filled 2013-08-29 (×7): qty 2

## 2013-08-29 MED ORDER — OXYCODONE HCL 5 MG PO TABS
5.0000 mg | ORAL_TABLET | Freq: Once | ORAL | Status: AC
  Administered 2013-08-29: 5 mg via ORAL
  Filled 2013-08-29: qty 1

## 2013-08-29 MED ORDER — FUROSEMIDE 10 MG/ML IJ SOLN
15.0000 mg/h | INTRAMUSCULAR | Status: DC
  Administered 2013-08-29 – 2013-09-02 (×5): 10 mg/h via INTRAVENOUS
  Administered 2013-09-03 (×2): 15 mg/h via INTRAVENOUS
  Filled 2013-08-29 (×11): qty 25

## 2013-08-29 MED ORDER — LEVOTHYROXINE SODIUM 200 MCG PO TABS
200.0000 ug | ORAL_TABLET | Freq: Every day | ORAL | Status: DC
  Filled 2013-08-29: qty 1

## 2013-08-29 MED ORDER — POTASSIUM CHLORIDE 10 MEQ/100ML IV SOLN
10.0000 meq | INTRAVENOUS | Status: AC
  Administered 2013-08-29 (×4): 10 meq via INTRAVENOUS
  Filled 2013-08-29: qty 100

## 2013-08-29 MED ORDER — POTASSIUM CHLORIDE CRYS ER 20 MEQ PO TBCR: 40.0000 meq | EXTENDED_RELEASE_TABLET | Freq: Every day | ORAL | Status: DC

## 2013-08-29 MED ORDER — SENNOSIDES-DOCUSATE SODIUM 8.6-50 MG PO TABS
2.0000 | ORAL_TABLET | Freq: Once | ORAL | Status: AC
  Administered 2013-08-29: 2 via ORAL
  Filled 2013-08-29: qty 2

## 2013-08-29 MED ORDER — LEVOTHYROXINE SODIUM 200 MCG PO TABS
200.0000 ug | ORAL_TABLET | Freq: Every day | ORAL | Status: DC
  Administered 2013-08-29 – 2013-09-02 (×5): 200 ug via ORAL
  Filled 2013-08-29 (×7): qty 1

## 2013-08-29 MED ORDER — ACETAMINOPHEN 325 MG PO TABS
650.0000 mg | ORAL_TABLET | Freq: Four times a day (QID) | ORAL | Status: DC | PRN
  Administered 2013-08-29 – 2013-09-06 (×14): 650 mg via ORAL
  Filled 2013-08-29 (×15): qty 2

## 2013-08-29 MED ORDER — SENNOSIDES-DOCUSATE SODIUM 8.6-50 MG PO TABS
1.0000 | ORAL_TABLET | Freq: Two times a day (BID) | ORAL | Status: DC
  Administered 2013-08-29 – 2013-09-01 (×6): 1 via ORAL
  Filled 2013-08-29 (×9): qty 1

## 2013-08-29 NOTE — Care Management Note (Addendum)
    Page 1 of 1   09/06/2013     11:59:26 AM CARE MANAGEMENT NOTE 09/06/2013  Patient:  Nicole Hansen, Nicole Hansen   Account Number:  [id number]  Date Initiated:  08/29/2013  Documentation initiated by:  Elissa Hefty  Subjective/Objective Assessment:   adm w gi bleed     Action/Plan:   lives w husband, pcpdr richard escajeda   Anticipated DC Date:  09/05/2013   Anticipated DC Plan:  [place]  CM consult      Choice offered to / List presented to:             Status of service:  In process, will continue to follow Medicare Important Message given?  YES (If response is "NO", the following Medicare IM given date fields will be blank) Date Medicare IM given:  08/31/2013 Medicare IM given by:  Elissa Hefty Date Additional Medicare IM given:  09/06/2013 Additional Medicare IM given by:  Lajuan Kovaleski  Discharge Disposition:    Per UR Regulation:  Reviewed for med. necessity/level of care/duration of stay  If discussed at [place] of Stay Meetings, dates discussed:    Comments:

## 2013-08-29 NOTE — Progress Notes (Signed)
Subjective: Pt has no complaints this morning. She feels better than yesterday. She still has not had a bowel movement and denies any BRBPR today.   Objective: Vital signs in last 24 hours: Filed Vitals:   08/29/13 0723 08/29/13 0726 08/29/13 0730 08/29/13 1133  BP: 117/50 129/58 120/94 125/49  Pulse: 84 85  87  Temp: 97.7 F (36.5 C) 97.7 F (36.5 C) 97.7 F (36.5 C) 97.9 F (36.6 C)  TempSrc: Oral Oral Oral Oral  Resp: 17 17  15   Height:      Weight:      SpO2: 98% 97%  92%   Weight change:   Intake/Output Summary (Last 24 hours) at 08/29/13 1339 Last data filed at 08/29/13 0843  Gross per 24 hour  Intake 1072.5 ml  Output   1590 ml  Net -517.5 ml   General Appearance:  Alert, NAD  Head:  Normocephalic, without obvious abnormality, atraumatic   Eyes:  EOM's intact   Lungs:  Clear to auscultation bilaterally, respirations unlabored   Heart:  Regular rate and rhythm, systolic murmur at rt sternal border   Abdomen:  Soft, non-tender, bowel sounds active all four quadrants   Extremities:  2+ firm pitting edema up to thigh, nonweeping pustules, normal capillary refill   Neurologic:  CNII-XII intact, normal strength, sensation and reflexes  throughout     Lab Results: Basic Metabolic Panel:  Recent Labs Lab 08/28/13 2016 08/29/13 0235  NA 128* 128*  K 3.3* 3.1*  CL 86* 87*  CO2 24 25  GLUCOSE 105* 93  BUN 36* 36*  CREATININE 1.33* 1.30*  CALCIUM 8.4 8.2*   Liver Function Tests:  Recent Labs Lab 08/28/13 1330  AST 27  ALT 14  ALKPHOS 104  BILITOT 0.8  PROT 6.6  ALBUMIN 3.2*   CBC:  Recent Labs Lab 08/29/13 0235 08/29/13 0945  WBC 4.8 4.8  HGB 6.8* 8.5*  HCT 21.4* 26.4*  MCV 93.4 93.3  PLT 125* 121*   BNP:  Recent Labs Lab 08/28/13 1330  PROBNP 2110.0*    Micro Results: Recent Results (from the past 240 hour(s))  MRSA PCR SCREENING     Status: None   Collection Time    08/28/13  7:53 PM      Result Value Ref Range Status   MRSA by PCR NEGATIVE  NEGATIVE Final   Comment:            The GeneXpert MRSA Assay (FDA     approved for NASAL specimens     only), is one component of a     comprehensive MRSA colonization     surveillance program. It is not     intended to diagnose MRSA     infection nor to guide or     monitor treatment for     MRSA infections.   Studies/Results: Dg Chest 2 View  08/28/2013   CLINICAL DATA:  Weakness.  EXAM: CHEST  2 VIEW  COMPARISON:  CT 07/10/2013.  Chest x-ray 07/09/2013.  FINDINGS: Mediastinum and hilar structures normal. Prior CABG. Prior aortic valve replacement. Cardiomegaly with pulmonary vascular prominence and interstitial prominence with bilateral pleural effusions right side greater than left. Findings consistent with congestive heart failure. No pneumothorax. No acute bony abnormality.  IMPRESSION: 1. Congestive heart failure with pulmonary interstitial edema and pleural effusions.  2.  Prior CABG.  Aortic valve replacement.   Electronically Signed   By: Marcello Moores  Register   On: 08/28/2013 13:29   Medications:  I have reviewed the patient's current medications. Scheduled Meds: . metolazone  2.5 mg Oral Daily  . potassium chloride  40 mEq Oral BID  . senna-docusate  1 tablet Oral BID  . sodium chloride  3 mL Intravenous Q12H   Continuous Infusions: . furosemide (LASIX) infusion     PRN Meds:.ondansetron (ZOFRAN) IV, ondansetron Assessment/Plan: Principal Problem:   Acute GI bleeding Active Problems:   Hypotension   Hypokalemia   Acute on chronic diastolic CHF (congestive heart failure)   AKI (acute kidney injury)   Acute posthemorrhagic anemia  BRBPR -- Pt has had to have 4 Units of blood transfused w/in the past 1-2 months. Pt had colo and EGD for anemia on 06/29/13 in [facility] that was positive for moderate sigmoid diverticulosis, internal and external hemorrhoids. She has not had any bleeding since admission. - Hgb 8.5 today after receiving 3U of blood  transfusions yesterday , baseline is 8.1 - currently not orthostatic today, will get daily orthostatic vitals  - senekot for constipation - if patient notices bleeding again will proceed with vaginal exam as patient was unsure if she was bleeding from her rectum or vagina. She does note that she only noticed blood when wiping from behind than when wiping upfront.   Acute on chronic diastolic HF: ECHO on 6/2 EF 55%, moderate mitral stenosis, mild aortic stenosis. CXR- CHF w/ pulmonary interstitial edema and pleural effusions. BNP on admission 2110, on 07/24/13 was 837.4. On PE pt has 2+ firm pitting edema up to the thighs w/ facial swelling.  - edema has gone down, no longer edematous in the face.  - HF team following, starting lasix 15 mg/hr gtt, and metolazone 2.5mg  daily - will continue to monitor weights and get I/Os, weight on prior d/c was not her dry weight which is less than 173. Pt currently weighs 191lbs -will continue diuresis w/ close monitor on renal fxn  AKI --2/2 diuretics and hypovolema. Cr on admission 1.42, baseline around 0.80  -Cr 1.30 today, will continue to monitor.   Hyponatremia-- 2/2 to diuretics or HF. Na on admission 127, normally runs low in the 130's  - Na currently 128. Low normal for patient. Will continue to monitor.   Hypokalemia  -repleting w/ KDur 56mEq BID and 4 runs of 56mEq KCk  DVT: SCD's  Dispo: Disposition is deferred at this time, awaiting improvement of current medical problems.  Anticipated discharge in approximately 1-2 day(s).   The patient does have a current PCP Rubie Maid, MD) and does need an [facility] hospital follow-up appointment after discharge.  The patient does not have transportation limitations that hinder transportation to clinic appointments.  .Services Needed at time of discharge: Y = Yes, Blank = No PT:   OT:   RN:   Equipment:   Other:     LOS: 1 day   Julious Oka, MD 08/29/2013, 1:39 PM

## 2013-08-29 NOTE — Progress Notes (Signed)
Daily Rounding Note  08/29/2013, 8:36 AM  LOS: 1 day   SUBJECTIVE:       Constipated, no real BM for at least 5 days, a problem since starting TID po Iron.  Only bleeding was on PM of 7/20.  No bleeding or BMs since. No nausea.  She is hungry and being kept NPO  OBJECTIVE:         Vital signs in last 24 hours:    Temp:  [97.3 F (36.3 C)-97.9 F (36.6 C)] 97.7 F (36.5 C) (07/22 0726) Pulse Rate:  [81-92] 85 (07/22 0726) Resp:  [10-21] 17 (07/22 0726) BP: (92-132)/(16-81) 129/58 mmHg (07/22 0726) SpO2:  [94 %-100 %] 97 % (07/22 0726) Weight:  [86.9 kg (191 lb 9.3 oz)-86.955 kg (191 lb 11.2 oz)] 86.9 kg (191 lb 9.3 oz) (07/22 0300)   General: Looks somewhat chronically ill.  pleasant   Heart: RRR Chest: clear bil Abdomen: NT, +BS, protuberant and somewhat tense  Extremities: + LE edema. Neuro/Psych:  In good humor, no gross deficits, relaxed  Intake/Output from previous day: 07/21 0701 - 07/22 0700 In: 972.5 [I.V.:50; Blood:622.5; IV Piggyback:300] Out: 2500 [Urine:1590]  Intake/Output this shift:    Lab Results:  Recent Labs  08/28/13 1330 08/29/13 0235  WBC 4.3 4.8  HGB 5.5* 6.8*  HCT 18.1* 21.4*  PLT 154 125*   BMET  Recent Labs  08/28/13 1330 08/28/13 2016 08/29/13 0235  NA 127* 128* 128*  K 3.1* 3.3* 3.1*  CL 88* 86* 87*  CO2 24 24 25   GLUCOSE 102* 105* 93  BUN 36* 36* 36*  CREATININE 1.45* 1.33* 1.30*  CALCIUM 8.6 8.4 8.2*   LFT  Recent Labs  08/28/13 1330  PROT 6.6  ALBUMIN 3.2*  AST 27  ALT 14  ALKPHOS 104  BILITOT 0.8  BILIDIR 0.3  IBILI 0.5   PT/INR No results found for this basename: LABPROT, INR,  in the last 72 hours  Studies/Results: Dg Chest 2 View  08/28/2013   CLINICAL DATA:  Weakness.  EXAM: CHEST  2 VIEW  COMPARISON:  CT 07/10/2013.  Chest x-ray 07/09/2013.  FINDINGS: Mediastinum and hilar structures normal. Prior CABG. Prior aortic valve replacement.  Cardiomegaly with pulmonary vascular prominence and interstitial prominence with bilateral pleural effusions right side greater than left. Findings consistent with congestive heart failure. No pneumothorax. No acute bony abnormality.  IMPRESSION: 1. Congestive heart failure with pulmonary interstitial edema and pleural effusions.  2.  Prior CABG.  Aortic valve replacement.   Electronically Signed   By: Marcello Moores  Register   On: 08/28/2013 13:29   Scheduled Meds: . furosemide  80 mg Intravenous BID  . potassium chloride  10 mEq Intravenous Q1 Hr x 4  . sodium chloride  3 mL Intravenous Q12H   Continuous Infusions:  PRN Meds:.ondansetron (ZOFRAN) IV, ondansetron   ASSESMENT:   *  Rectal bleeding. ? Of vaginal bleeding.  EGD, colonoscopy per Dr Dorrene German  *  ABL anemia, chronic anemia.  S/p transfusion, 3 units, hgb improved.  Received 4 units at [facility] in 07/2013. On po iron at home.   *  Thrombocytopenia.  07/10/13 CT chest with "Lobulated liver contour and probable splenomegaly, findings which  suggest cirrhosis"  *  Constipation from po Iron.   *  Hyponatremia. Hypokalemia. Renal insufficiency.   *  CHF  *  Tissue AVR. No blood thinners or ASA PTA    PLAN   *  Review [place] [facility] records and make decision re further studies *  At discharge would decrease po iron to once daily.  Could give parenteral iron if Iron stores are low (no labs to asess this).  Add senekot for now.  Miralax did not help in past.  *  Solid food.     Azucena Freed  08/29/2013, 8:36 AM Pager: [phone]  Addendum \ See attached records of 06/2013 GI procedures:  Pt may need outpt capsule endo.  She will need appt with Dr Colon Branch in [facility].        Attending physician's note   I have taken an interval history, reviewed the chart and examined the patient. I agree with the Advanced Practitioner's note, impression and recommendations. Presumed diverticular or hemorrhoidal bleed which has resolved. Add  Senakot for constipation. Advance diet. Follow up with Dr. Dorrene German as outpatient to further evaluated possible cirrhosis and to consider CE to further evaluate anemia. GI signing off.  Pricilla Riffle. Fuller Plan, MD [facility]

## 2013-08-29 NOTE — Consult Note (Signed)
Advanced Heart Failure Team Consult Note  Referring Physician: Internal Medicine Primary Physician: Rubie Maid, MD Primary Cardiologist:  Dr. Wyline Copas  Reason for Consultation: Diastolic HF   HPI:    Ms Sessler is a 73 yo female with a history of AI s/p AVR (bovine), hypothyroidism s/p thyroidectomy, HTN, obesity and diastolic HF.  In past couple months admitted multiple times to [facility] for volume overload. During one of her stays at [facility] she was transfused with 4 PRBCs and had an upper endoscopy and lower colonoscopy that was negative for bleeding. Admitted 6/1-6/18 for volume overload and required lasix gtt and metoalzone. She diuresed a total of 42 lbs and still was mildly volume overloaded when she was sent home, but was adamant she was leaving. During stay she had low SBP 80-90s on no antihypertensives. Discharge weight was 173 lbs.  Sent to the ED yesterday after coming to clinic for lab work and complaining of fatigue and BRBPR. Reports that she was bleeding so much that saturated the sheets and rug. Labs in ED K+ 2.8, creatinine 1.42, pro-BNP 2110 and Hgb 5.5.  Has received 3 PRBCs with no further bleeding. GI consulted. Started on IV lasix 80 mg IV BID last night. 24 hr I/O -600 cc. Denies SOB, orthopnea or CP.    Review of Systems: [y] = yes, [ ]  = no   General: Weight gain [Y ]; Weight loss [ ] ; Anorexia [ ] ; Fatigue [ ] ; Fever [ ] ; Chills [ ] ; Weakness [ ]   Cardiac: Chest pain/pressure Aqua.Slicker ]; Resting SOB Aqua.Slicker ]; Exertional SOB [ N]; Cardell Peach ]; Pedal Edema [Y ]; Palpitations [ ] ; Syncope [ ] ; Presyncope [ ] ; Paroxysmal nocturnal dyspnea[ ]   Pulmonary: Cough [ ] ; Wheezing[ ] ; Hemoptysis[ ] ; Sputum [ ] ; Snoring [ ]   GI: Vomiting[ ] ; Dysphagia[ ] ; Melena[ ] ; Hematochezia [ ] ; Heartburn[ ] ; Abdominal pain [ ] ; Constipation [Y ]; Diarrhea [ ] ; BRBPR [Y ]  GU: Hematuria[ ] ; Dysuria [ ] ; Nocturia[ ]   Vascular: Pain in legs with walking [ ] ; Pain in feet with lying flat [  ]; Non-healing sores [ ] ; Stroke [ ] ; TIA [ ] ; Slurred speech [ ] ;  Neuro: Headaches[ ] ; Vertigo[ ] ; Seizures[ ] ; Paresthesias[ ] ;Blurred vision [ ] ; Diplopia [ ] ; Vision changes [ ]   Ortho/Skin: Arthritis [ ] ; Joint pain [ Y]; Muscle pain [ ] ; Joint swelling [ ] ; Back Pain [ ] ; Rash [ ]   Psych: Depression[ ] ; Anxiety[ ]   Heme: Bleeding problems [ ] ; Clotting disorders [ ] ; Anemia [ ]   Endocrine: Diabetes [ ] ; Thyroid dysfunction[ ]   Home Medications Prior to Admission medications   Medication Sig Start Date End Date Taking? Authorizing Provider  acetaminophen (TYLENOL) 325 MG tablet Take 650 mg by mouth every 6 (six) hours as needed (pain).    Yes Historical Provider, MD  Dermatological Products, Misc. (MOISTURE) CREA Apply 1 application topically at bedtime.   Yes Historical Provider, MD  ferrous sulfate 325 (65 FE) MG tablet Take 325 mg by mouth 3 (three) times daily with meals.    Yes Historical Provider, MD  gabapentin (NEURONTIN) 300 MG capsule Take 300 mg by mouth 2 (two) times daily.  05/31/13  Yes Historical Provider, MD  levothyroxine (SYNTHROID, LEVOTHROID) 200 MCG tablet Take 1 tablet (200 mcg total) by mouth daily before breakfast. 07/26/13  Yes Barton Dubois, MD  LORazepam (ATIVAN) 1 MG tablet Take 1 mg by mouth at bedtime as needed for anxiety or sleep.  sleep 06/01/13  Yes Historical Provider, MD  metolazone (ZAROXOLYN) 2.5 MG tablet Take 2.5 mg by mouth 3 (three) times a week. On Tuesday, Thursday and Saturday.   Yes Historical Provider, MD  potassium chloride SA (K-DUR,KLOR-CON) 20 MEQ tablet Take 20 mEq by mouth 3 (three) times daily.  07/02/13  Yes Historical Provider, MD  spironolactone (ALDACTONE) 25 MG tablet Take 1 tablet (25 mg total) by mouth daily. 08/13/13  Yes Amy D Clegg, NP  torsemide (DEMADEX) 20 MG tablet Take 60 mg by mouth 2 (two) times daily.   Yes Historical Provider, MD    Past Medical History: Past Medical History  Diagnosis Date  . CHF (congestive heart  failure)   . Thyroid disease   . Neuropathy     Past Surgical History: History reviewed. No pertinent past surgical history.  Family History: Family Status  Relation Status Death Age  . Father Deceased     CHF, HTN  . Mother Deceased     AAA, HTN    Social History: History   Social History  . Marital Status: Married    Spouse Name: N/A    Number of Children: N/A  . Years of Education: N/A   Social History Main Topics  . Smoking status: Former Research scientist (life sciences)  . Smokeless tobacco: Former Systems developer    Quit date: 07/10/1984  . Alcohol Use: No  . Drug Use: No  . Sexual Activity: None   Other Topics Concern  . None   Social History Narrative   Lives with Husband in [place].     Allergies:  Allergies  Allergen Reactions  . Pineapple Other (See Comments)    Sores in mouth  . Penicillins Rash    Objective:    Vital Signs:   Temp:  [97.3 F (36.3 C)-97.9 F (36.6 C)] 97.9 F (36.6 C) (07/22 1133) Pulse Rate:  [81-92] 87 (07/22 1133) Resp:  [10-21] 15 (07/22 1133) BP: (92-132)/(40-94) 125/49 mmHg (07/22 1133) SpO2:  [92 %-100 %] 92 % (07/22 1133) Weight:  [191 lb 9.3 oz (86.9 kg)-191 lb 11.2 oz (86.955 kg)] 191 lb 9.3 oz (86.9 kg) (07/22 0300)    Weight change: Filed Weights   08/28/13 1927 08/29/13 0300  Weight: 191 lb 11.2 oz (86.955 kg) 191 lb 9.3 oz (86.9 kg)    Intake/Output:   Intake/Output Summary (Last 24 hours) at 08/29/13 1331 Last data filed at 08/29/13 0843  Gross per 24 hour  Intake 1072.5 ml  Output   1590 ml  Net -517.5 ml     Physical Exam: General: Well appearing. No resp difficulty, sitting in chair HEENT: normal  Neck: supple. JVP to earlobe; Carotids 2+ bilat; no bruits. No lymphadenopathy or thryomegaly appreciated.  Cor: PMI nondisplaced. Regular rate & rhythm. No rubs or gallops, 2/6 AS and MR  Lungs: clear  Abdomen: nontender, distended. No hepatosplenomegaly. No bruits or masses. Good bowel sounds.  Extremities: no cyanosis,  clubbing, rash, R/L 2-3+ edema to thighs Neuro: alert & orientedx3, cranial nerves grossly intact. moves all 4 extremities w/o difficulty. Affect pleasant   Telemetry: SR 80s  Labs: Basic Metabolic Panel:  Recent Labs Lab 08/28/13 1157 08/28/13 1330 08/28/13 2016 08/29/13 0235  NA 127* 127* 128* 128*  K 2.8* 3.1* 3.3* 3.1*  CL 85* 88* 86* 87*  CO2 22 24 24 25   GLUCOSE 114* 102* 105* 93  BUN 36* 36* 36* 36*  CREATININE 1.42* 1.45* 1.33* 1.30*  CALCIUM 8.4 8.6 8.4 8.2*  Liver Function Tests:  Recent Labs Lab 08/28/13 1330  AST 27  ALT 14  ALKPHOS 104  BILITOT 0.8  PROT 6.6  ALBUMIN 3.2*   No results found for this basename: LIPASE, AMYLASE,  in the last 168 hours No results found for this basename: AMMONIA,  in the last 168 hours  CBC:  Recent Labs Lab 08/28/13 1330 08/29/13 0235 08/29/13 0945  WBC 4.3 4.8 4.8  HGB 5.5* 6.8* 8.5*  HCT 18.1* 21.4* 26.4*  MCV 97.3 93.4 93.3  PLT 154 125* 121*    Cardiac Enzymes: No results found for this basename: CKTOTAL, CKMB, CKMBINDEX, TROPONINI,  in the last 168 hours  BNP: BNP (last 3 results)  Recent Labs  07/09/13 1602 07/24/13 0610 08/28/13 1330  PROBNP 1673.0* 837.4* 2110.0*    CBG: No results found for this basename: GLUCAP,  in the last 168 hours  Coagulation Studies: No results found for this basename: LABPROT, INR,  in the last 72 hours  Other results: EKG: SR with 1 degree AV block 84 bpm  Imaging: Dg Chest 2 View  08/28/2013   CLINICAL DATA:  Weakness.  EXAM: CHEST  2 VIEW  COMPARISON:  CT 07/10/2013.  Chest x-ray 07/09/2013.  FINDINGS: Mediastinum and hilar structures normal. Prior CABG. Prior aortic valve replacement. Cardiomegaly with pulmonary vascular prominence and interstitial prominence with bilateral pleural effusions right side greater than left. Findings consistent with congestive heart failure. No pneumothorax. No acute bony abnormality.  IMPRESSION: 1. Congestive heart failure  with pulmonary interstitial edema and pleural effusions.  2.  Prior CABG.  Aortic valve replacement.   Electronically Signed   By: Marcello Moores  Register   On: 08/28/2013 13:29     Medications:     Current Medications: . furosemide  80 mg Intravenous BID  . senna-docusate  1 tablet Oral BID  . sodium chloride  3 mL Intravenous Q12H    Infusions:     Assessment:   1) Acute blood loss anemia - Rectal? 2) A/C diastolic HF 3) AKI  - baseline Cr 0.80 4) Hypokalemia 5) Hyponatremia 6) Cirrhosis  Plan/Discussion:    Mrs. Gottlieb is well known to the HF team and was recently admitted with marked volume overload and diuresed 42 lbs. Presented to the hospital yesterday with BRBPR and fatigue. Hgb in ED was 5.5.  BRBPR - stable has not had anymore and Hgb is stable. Primary team and GI managing A/C diastolic HF - She is markedly volume overloaded. Will start lasix gtt at 15 mg/hr with metolazone 2.5 mg daily. Will watch renal function closely.  - Place TED hose - Last visit weight on d/c was 173 lbs, however still was not euvolemic but was adamant about going home. - BMET daily - Consult CR.  Cirrhosis - CT chest in 07/2013 with lobulated liver contur and probable spleenomegagly which suggest cirrhosis - Likely related to hepatic congestion. Will continue spiro 25 mg daily. May increase to BID if BP tolerates once she starts diuresing AKI - related to hypovolemia 2/2 acute blood loss anemia. Baseline creatinine 0.8. Will continue to follow.   Length of Stay: 1  Rande Brunt NP-C 08/29/2013, 1:31 PM  Advanced Heart Failure Team Pager [phone] (M-F; 7a - 4p)  Please contact [facility] for night-coverage after hours (4p -7a ) and weekends on [email or web address]  Patient seen with NP, agree with the above note.  1. Acute blood loss anemia: Patient has been evaluated by GI, suspect diverticular bleeding.  Seems to  have resolved.  2. Acute on chronic diastolic CHF: Patient has  gradually built up fluid per her report, suspect this was made worse by blood transfusions this admission.  EF 55-60% on 6/15 echo.   - Start Lasix gtt 10 mg/hr (same regimen as last admission here) with metolazone 2.5 mg daily.   - Replete K aggressively.  3. Bioprosthetic aortic valve: Function appeared normal on prior echo in 6/15. 4. 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  I am concerned that this may play a role in her CHF.  With significant calcification, she would not be a valvuloplasty candidate, and a redo sternotomy would be high risk.  I talked to her about doing a TEE to assess the degree of mitral stenosis more closely to try to see how much this may be contributing to her CHF.   5. CKD: Creatinine mildly elevated from baseline, follow closely with diuresis.   [name] 08/29/2013 2:10 PM

## 2013-08-29 NOTE — Progress Notes (Signed)
Internal Medicine Attending Admission Note Date: 08/29/2013  Patient name: Nicole Hansen record number: [id number] Date of birth: [birth year] Age: 73 y.o. Gender: female  I saw and evaluated the patient. I reviewed the resident's note and I agree with the resident's findings and plan as documented in the resident's note.  Chief Complaint(s): Painless bright red blood per rectum  History - key components related to admission:  Nicole Hansen is a 73 year old woman with a history of diverticulosis, bovine aortic valve replacement, chronic diastolic heart failure, hemorrhoids, and hypertension who presents to the emergency department with bright red blood per rectum. She has noticed intermittent bleeding in her bowel movements over the last 2 weeks. On the morning of admission she had significantly more bright red blood per rectum that soaked both her pads and her rugs. She was recently placed on iron therapy for iron deficiency and has been constipated for the last 5 days. She attributed the bleeding recently to the hard stools that she has had. As part of the May 2015 iron deficiency anemia workup she had a negative EGD and a colonoscopy that was notable for moderate sigmoid diverticulosis, 2 small transverse tubular adenomas that were excised endoscopically, and internal and external hemorrhoids. In the emergency department she was noted to have a hemoglobin of 5.5 and was therefore admitted to the internal medicine teaching service for further evaluation and care. Since admission she has received 3 units of packed red blood cells and has had no further episodes of bright red blood per rectum.  When seen on rounds this morning she felt well without any shortness of breath. Of note, her admission weight was 191 pounds which is up from a dry weight considered to be below 173 pounds in June when she was discharged from the hospital at her insistence before achieving dry weight.  Physical Exam - key  components related to admission:  Filed Vitals:   08/29/13 1515 08/29/13 1530 08/29/13 1545 08/29/13 1619  BP: 100/54 119/53 104/48 97/49  Pulse:  92  91  Temp:    97.4 F (36.3 C)  TempSrc:    Oral  Resp: 14 17 14 17   Height:      Weight:      SpO2:  98%  92%   Gen.: Well-developed, well-nourished, woman in excellent spirits sitting comfortably in bed in no acute distress. Lungs: Left basilar inspiratory crackles without wheezes or rhonchi. Heart: Regular rate and rhythm with a 2/6 crescendo decrescendo murmur best heard in the right upper sternal border with fixed splitting of S2. Abdomen: Soft, nontender, without guarding or rebound. Extremities: Pitting edema to the mid thigh.  Lab results:  Basic Metabolic Panel:  Recent Labs  08/28/13 2016 08/29/13 0235  NA 128* 128*  K 3.3* 3.1*  CL 86* 87*  CO2 24 25  GLUCOSE 105* 93  BUN 36* 36*  CREATININE 1.33* 1.30*  CALCIUM 8.4 8.2*   Liver Function Tests:  Recent Labs  08/28/13 1330  AST 27  ALT 14  ALKPHOS 104  BILITOT 0.8  PROT 6.6  ALBUMIN 3.2*   CBC:  Recent Labs  08/29/13 0945 08/29/13 1435  WBC 4.8 6.2  HGB 8.5* 8.4*  HCT 26.4* 26.5*  MCV 93.3 91.4  PLT 121* 143*   Misc. Labs:  ProBNP 2110  Imaging results:  Dg Chest 2 View  08/28/2013   CLINICAL DATA:  Weakness.  EXAM: CHEST  2 VIEW  COMPARISON:  CT 07/10/2013.  Chest x-ray 07/09/2013.  FINDINGS:  Mediastinum and hilar structures normal. Prior CABG. Prior aortic valve replacement. Cardiomegaly with pulmonary vascular prominence and interstitial prominence with bilateral pleural effusions right side greater than left. Findings consistent with congestive heart failure. No pneumothorax. No acute bony abnormality.  IMPRESSION: 1. Congestive heart failure with pulmonary interstitial edema and pleural effusions.  2.  Prior CABG.  Aortic valve replacement.   Electronically Signed   By: Marcello Moores  Register   On: 08/28/2013 13:29   Other results:  EKG:  Normal sinus rhythm at 84 beats per minute, first degree AV block, right bundle branch block, normal axis, no significant Q waves, no left ventricular hypertrophy, nonspecific ST-T segment changes with T wave inversions in V1 through V3 and lead III. This ECG is not significantly changed from the previous ECG in June 2015.  Assessment & Plan by Problem:  Nicole Hansen is a 73 year old woman with a history of a bovine aortic valve replacement, chronic diastolic heart failure, diverticulosis, internal and external hemorrhoids, and hypertension who presents with bright red blood per rectum and significant anemia. She has responded well to 3 units of packed red blood cells and has had no further bleeding since admission. She also presents with decompensation of her chronic diastolic heart failure and weight up nearly 20 pounds from discharge in June.  1) Bright red blood per rectum: We will follow serial hematocrits on a daily basis to assure stability. She has an active type and cross as well as a wide bore IV in the jugular vein. We appreciate GI's assessment and recommendations. Given how recent her colonoscopy was and the presumed source of a diverticular bleed they are not planning further endoscopy as long as there is no further bleeding.  2) Acute on chronic diastolic heart failure: We appreciate the heart failure team's evaluation. They have decided to begin aggressive diuresis with the same regimen that was used in June. This includes IV Lasix with metolazone. They are also considering a TEE to assess her mitral stenosis in case this may also be contributing to her recurrent heart failure. Our goal will be to diurese her to a new dry weight which will be determined during this admission should she allow [place] to continue her aggressive diuresis as an inpatient.  3) Disposition: Pending the diuresis as outlined above. I suspect this will take at least several days.

## 2013-08-29 NOTE — Op Note (Signed)
Synopsis of operative reports for GI procedures from [facility], Dr Dorrene German.   Indications: anemia secondary to chronic blood loss.   Colonoscopy 06/29/13 reached 4 cm into TI.  2 diminutive transverse polyps, removed Moderate sigmoid diverticulosis. Int and ext hemorrhoids Pathology: fragments of tubular adenomas   EGD 06/29/13 Mild, patchy antral erythema. Freckled looking, circumferential, tiny red spots in duodenum.  No discrete AVMs.  "Bumpy" tissue in duodenal bulb.  Pathology: chronic gastritis, no H Pylori.  SB bx: no features of sprue, no organisms of Whipples disease or Giardia. No SB pathologic changes

## 2013-08-30 ENCOUNTER — Encounter (HOSPITAL_COMMUNITY): Payer: Self-pay | Admitting: *Deleted

## 2013-08-30 ENCOUNTER — Encounter (HOSPITAL_COMMUNITY)
Admission: EM | Disposition: A | Discharge status: Home / Self Care | Payer: Self-pay | Source: Home / Self Care | Attending: Internal Medicine

## 2013-08-30 DIAGNOSIS — E871 Hypo-osmolality and hyponatremia: Secondary | ICD-10-CM

## 2013-08-30 DIAGNOSIS — I059 Rheumatic mitral valve disease, unspecified: Secondary | ICD-10-CM

## 2013-08-30 HISTORY — PX: TEE WITHOUT CARDIOVERSION: SHX5443

## 2013-08-30 LAB — TYPE AND SCREEN
ABO/RH(D): O POS
ANTIBODY SCREEN: NEGATIVE
UNIT DIVISION: 0
Unit division: 0
Unit division: 0
Unit division: 0

## 2013-08-30 LAB — CBC
HCT: 22.4 % — ABNORMAL LOW (ref 36.0–46.0)
HEMOGLOBIN: 7.1 g/dL — AB (ref 12.0–15.0)
MCH: 29.2 pg (ref 26.0–34.0)
MCHC: 31.7 g/dL (ref 30.0–36.0)
MCV: 92.2 fL (ref 78.0–100.0)
Platelets: 124 10*3/uL — ABNORMAL LOW (ref 150–400)
RBC: 2.43 MIL/uL — ABNORMAL LOW (ref 3.87–5.11)
RDW: 17.6 % — ABNORMAL HIGH (ref 11.5–15.5)
WBC: 5.4 10*3/uL (ref 4.0–10.5)

## 2013-08-30 LAB — CBC WITH DIFFERENTIAL/PLATELET
BASOS ABS: 0 10*3/uL (ref 0.0–0.1)
BASOS PCT: 0 % (ref 0–1)
EOS ABS: 0.1 10*3/uL (ref 0.0–0.7)
EOS PCT: 1 % (ref 0–5)
HEMATOCRIT: 26.4 % — AB (ref 36.0–46.0)
Hemoglobin: 8.2 g/dL — ABNORMAL LOW (ref 12.0–15.0)
Lymphocytes Relative: 28 % (ref 12–46)
Lymphs Abs: 1.3 10*3/uL (ref 0.7–4.0)
MCH: 28.9 pg (ref 26.0–34.0)
MCHC: 31.1 g/dL (ref 30.0–36.0)
MCV: 93 fL (ref 78.0–100.0)
MONO ABS: 0.5 10*3/uL (ref 0.1–1.0)
MONOS PCT: 12 % (ref 3–12)
Neutro Abs: 2.6 10*3/uL (ref 1.7–7.7)
Neutrophils Relative %: 59 % (ref 43–77)
Platelets: 129 10*3/uL — ABNORMAL LOW (ref 150–400)
RBC: 2.84 MIL/uL — ABNORMAL LOW (ref 3.87–5.11)
RDW: 17.5 % — AB (ref 11.5–15.5)
WBC: 4.5 10*3/uL (ref 4.0–10.5)

## 2013-08-30 LAB — BASIC METABOLIC PANEL
Anion gap: 14 (ref 5–15)
BUN: 35 mg/dL — AB (ref 6–23)
CALCIUM: 8.3 mg/dL — AB (ref 8.4–10.5)
CO2: 26 mEq/L (ref 19–32)
Chloride: 92 mEq/L — ABNORMAL LOW (ref 96–112)
Creatinine, Ser: 1.13 mg/dL — ABNORMAL HIGH (ref 0.50–1.10)
GFR calc Af Amer: 54 mL/min — ABNORMAL LOW (ref >90)
GFR calc non Af Amer: 47 mL/min — ABNORMAL LOW (ref >90)
GLUCOSE: 91 mg/dL (ref 70–99)
Potassium: 3.6 mEq/L — ABNORMAL LOW (ref 3.7–5.3)
Sodium: 132 mEq/L — ABNORMAL LOW (ref 137–147)

## 2013-08-30 LAB — MAGNESIUM: MAGNESIUM: 2.2 mg/dL (ref 1.5–2.5)

## 2013-08-30 SURGERY — ECHOCARDIOGRAM, TRANSESOPHAGEAL
Anesthesia: Moderate Sedation

## 2013-08-30 MED ORDER — POLYETHYLENE GLYCOL 3350 17 G PO PACK
17.0000 g | PACK | Freq: Two times a day (BID) | ORAL | Status: DC
  Administered 2013-08-30 – 2013-09-01 (×4): 17 g via ORAL
  Filled 2013-08-30 (×8): qty 1

## 2013-08-30 MED ORDER — GABAPENTIN 300 MG PO CAPS
300.0000 mg | ORAL_CAPSULE | Freq: Two times a day (BID) | ORAL | Status: DC
  Administered 2013-08-30 – 2013-09-07 (×16): 300 mg via ORAL
  Filled 2013-08-30 (×17): qty 1

## 2013-08-30 MED ORDER — METOLAZONE 2.5 MG PO TABS
2.5000 mg | ORAL_TABLET | Freq: Once | ORAL | Status: AC
  Administered 2013-08-30: 2.5 mg via ORAL
  Filled 2013-08-30: qty 1

## 2013-08-30 MED ORDER — FENTANYL CITRATE 0.05 MG/ML IJ SOLN
INTRAMUSCULAR | Status: AC
  Filled 2013-08-30: qty 2

## 2013-08-30 MED ORDER — MIDAZOLAM HCL 10 MG/2ML IJ SOLN
INTRAMUSCULAR | Status: DC | PRN
  Administered 2013-08-30 (×3): 1 mg via INTRAVENOUS
  Administered 2013-08-30: 2 mg via INTRAVENOUS

## 2013-08-30 MED ORDER — POTASSIUM CHLORIDE CRYS ER 20 MEQ PO TBCR
40.0000 meq | EXTENDED_RELEASE_TABLET | Freq: Once | ORAL | Status: AC
  Administered 2013-08-30: 40 meq via ORAL

## 2013-08-30 MED ORDER — MIDAZOLAM HCL 5 MG/ML IJ SOLN
INTRAMUSCULAR | Status: AC
  Filled 2013-08-30: qty 2

## 2013-08-30 MED ORDER — SODIUM CHLORIDE 0.9 % IV SOLN
INTRAVENOUS | Status: DC
  Administered 2013-08-30: 10:00:00 via INTRAVENOUS

## 2013-08-30 MED ORDER — FENTANYL CITRATE 0.05 MG/ML IJ SOLN
INTRAMUSCULAR | Status: DC | PRN
  Administered 2013-08-30: 12.5 ug via INTRAVENOUS
  Administered 2013-08-30: 25 ug via INTRAVENOUS
  Administered 2013-08-30: 12.5 ug via INTRAVENOUS

## 2013-08-30 MED ORDER — BUTAMBEN-TETRACAINE-BENZOCAINE 2-2-14 % EX AERO
INHALATION_SPRAY | CUTANEOUS | Status: DC | PRN
  Administered 2013-08-30: 2 via TOPICAL

## 2013-08-30 NOTE — Interval H&P Note (Signed)
History and Physical Interval Note:  08/30/2013 10:40 AM  Nicole Hansen  has presented today for surgery, with the diagnosis of Mitral stenosis  The various methods of treatment have been discussed with the patient and family. After consideration of risks, benefits and other options for treatment, the patient has consented to  Procedure(s): TRANSESOPHAGEAL ECHOCARDIOGRAM (TEE) (N/A) as a surgical intervention .  The patient's history has been reviewed, patient examined, no change in status, stable for surgery.  I have reviewed the patient's chart and labs.  Questions were answered to the patient's satisfaction.     Eleni Frank [employer]

## 2013-08-30 NOTE — CV Procedure (Signed)
Procedure: TEE  Indication: Mitral stenosis, bioprosthetic aortic valve stenosis.  Severe diastolic CHF.   Sedation: Versed 5 mg IV, Fentanyl 50 mcg IV  Findings: Please see echo section for full report.  Normal LV size and systolic function, EF 02%.  Normal wall motion.  The interventricular septum is mildly D-shaped, suggesting RV pressure/volume overload.  The RV was mildly dilated with normal systolic function.  Moderate TR, RV-RV peak gradient not elevated.  The mitral valve annulus was severely calcified and the posterior mitral leaflet was heavily calcified and restricted.  The anterior leaflet moved relatively normally.  Mean gradient was 10 mmHg but MVA was > 2 by PHT. I suspect that mitral stenosis is probably mild, with elevated mean gradient perhaps more due to high flow.  There was mild to moderate MR.  There was a bioprosthetic aortic valve.  This valve opened well but was small.  Mean gradient across the valve was 20 mmHg, suggesting mild-moderate stenosis, likely due to a degree of patient-prosthesis mismatch.   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    [name] 08/30/2013

## 2013-08-30 NOTE — Progress Notes (Signed)
Internal Medicine Attending  Date: 08/30/2013  Patient name: Nicole Hansen record number: [id number] Date of birth: 16-Jul-1940 Age: 73 y.o. Gender: female  I saw and evaluated the patient. I developed the assessment and plan with the housestaff and reviewed the resident's note by Dr. Hulen Luster.  I agree with the resident's findings and plans as documented in her progress note.  Nicole Hansen was in great spirits when seen on rounds this AM.  LE edema was not significantly changed despite diuresis.  Unclear what explains the drop in the Hgb this AM but I suspect it was lab error as repeat CBC was stable and she has had no further bleeding.  Appreciate Cardiology's assistance at better defining her valvular anatomy and its potential role in her symptoms.  Will continue aggressive diuresis as we hope to achieve her true dry weight before discharge if she will let us.

## 2013-08-30 NOTE — H&P (View-Only) (Signed)
Patient ID: Bob Eastwood, female   DOB: Feb 26, 1940, 73 y.o.   MRN: [id number]   SUBJECTIVE: Patient diuresed well yesterday, creatinine stable.  No overt bleeding but hemoglobin down today.   Scheduled Meds: . levothyroxine  200 mcg Oral QAC breakfast  . metolazone  2.5 mg Oral Once  . potassium chloride  40 mEq Oral BID  . potassium chloride  40 mEq Oral Once  . senna-docusate  1 tablet Oral BID  . sodium chloride  3 mL Intravenous Q12H   Continuous Infusions: . furosemide (LASIX) infusion 10 mg/hr (08/29/13 1436)   PRN Meds:.acetaminophen, ondansetron (ZOFRAN) IV, ondansetron    Filed Vitals:   08/30/13 0000 08/30/13 0341 08/30/13 0600 08/30/13 0748  BP: 90/33 111/59 102/47 105/46  Pulse:    88  Temp:  98.5 F (36.9 C)  98.8 F (37.1 C)  TempSrc:  Oral  Oral  Resp: 19  24 16   Height:      Weight:  187 lb 12.8 oz (85.186 kg)    SpO2: 93% 94%  94%    Intake/Output Summary (Last 24 hours) at 08/30/13 0802 Last data filed at 08/30/13 0600  Gross per 24 hour  Intake    560 ml  Output   4150 ml  Net  -3590 ml    LABS: Basic Metabolic Panel:  Recent Labs  08/29/13 0235 08/30/13 0235  NA 128* 132*  K 3.1* 3.6*  CL 87* 92*  CO2 25 26  GLUCOSE 93 91  BUN 36* 35*  CREATININE 1.30* 1.13*  CALCIUM 8.2* 8.3*  MG  --  2.2   Liver Function Tests:  Recent Labs  08/28/13 1330  AST 27  ALT 14  ALKPHOS 104  BILITOT 0.8  PROT 6.6  ALBUMIN 3.2*   No results found for this basename: LIPASE, AMYLASE,  in the last 72 hours CBC:  Recent Labs  08/29/13 1435 08/30/13 0235  WBC 6.2 5.4  HGB 8.4* 7.1*  HCT 26.5* 22.4*  MCV 91.4 92.2  PLT 143* 124*   Cardiac Enzymes: No results found for this basename: CKTOTAL, CKMB, CKMBINDEX, TROPONINI,  in the last 72 hours BNP: No components found with this basename: POCBNP,  D-Dimer: No results found for this basename: DDIMER,  in the last 72 hours Hemoglobin A1C: No results found for this basename: HGBA1C,  in the last  72 hours Fasting Lipid Panel: No results found for this basename: CHOL, HDL, LDLCALC, TRIG, CHOLHDL, LDLDIRECT,  in the last 72 hours Thyroid Function Tests: No results found for this basename: TSH, T4TOTAL, FREET3, T3FREE, THYROIDAB,  in the last 72 hours Anemia Panel: No results found for this basename: VITAMINB12, FOLATE, FERRITIN, TIBC, IRON, RETICCTPCT,  in the last 72 hours  RADIOLOGY: Dg Chest 2 View  08/28/2013   CLINICAL DATA:  Weakness.  EXAM: CHEST  2 VIEW  COMPARISON:  CT 07/10/2013.  Chest x-ray 07/09/2013.  FINDINGS: Mediastinum and hilar structures normal. Prior CABG. Prior aortic valve replacement. Cardiomegaly with pulmonary vascular prominence and interstitial prominence with bilateral pleural effusions right side greater than left. Findings consistent with congestive heart failure. No pneumothorax. No acute bony abnormality.  IMPRESSION: 1. Congestive heart failure with pulmonary interstitial edema and pleural effusions.  2.  Prior CABG.  Aortic valve replacement.   Electronically Signed   By: Marcello Moores  Register   On: 08/28/2013 13:29    PHYSICAL EXAM General: NAD Neck: JVP 12-14 cm, no thyromegaly or thyroid nodule.  Lungs: Crackles at bases CV: Nondisplaced PMI.  Heart regular S1/S2, no S3/S4, 2/6 SEM RUSB.  2+ edema to thighs.   Abdomen: Soft, nontender, no hepatosplenomegaly, no distention.  Neurologic: Alert and oriented x 3.  Psych: Normal affect. Extremities: No clubbing or cyanosis.   TELEMETRY: Reviewed telemetry pt in NSR  ASSESSMENT AND PLAN: 73 yo with history of bioprosthetic AVR and diastolic CHF presented with GI bleeding and acute on chronic diastolic CHF.  1. Acute blood loss anemia: Patient has been evaluated by GI, suspect diverticular bleeding. No overt bleeding but hemoglobin down to 7.1.  Suspect she will need another unit PRBCs, per primary team. 2. Acute on chronic diastolic CHF: Patient has gradually built up fluid per her report, suspect this was  made worse by blood transfusions this admission. EF 55-60% on 6/15 echo. She diuresed well yesterday, weight is down 4 lbs.  - Continue Lasix gtt 10 mg/hr with metolazone 2.5 mg daily.  - Replete K aggressively.  3. Bioprosthetic aortic valve: Function appeared normal on prior echo in 6/15.  4. 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 I am concerned that this may play a role in her CHF. With significant calcification, she would not be a valvuloplasty candidate, and a redo sternotomy would be high risk. I talked to her about doing a TEE to assess the degree of mitral stenosis more closely to try to see how much this may be contributing to her CHF.  Will do this today.  5. CKD: Creatinine lower today, likely with lowering of renal venous pressure with diuresis.   [name] 08/30/2013 8:06 AM

## 2013-08-30 NOTE — Progress Notes (Signed)
Subjective: Pt has no complaints this morning. She feels better than yesterday. She still has not had a bowel movement and denies any BRBPR today.   Objective: Vital signs in last 24 hours: Filed Vitals:   08/30/13 1125 08/30/13 1130 08/30/13 1135 08/30/13 1140  BP: 102/53 101/50 100/46 105/52  Pulse: 81 83 82 84  Temp:      TempSrc:      Resp: 15 10 15 13   Height:      Weight:      SpO2: 98% 98% 98% 98%   Weight change: -3 lb 14.4 oz (-1.769 kg)  Intake/Output Summary (Last 24 hours) at 08/30/13 1144 Last data filed at 08/30/13 0942  Gross per 24 hour  Intake    460 ml  Output   4700 ml  Net  -4240 ml   General Appearance:  Alert, NAD  Head:  Normocephalic, without obvious abnormality, atraumatic   Eyes:  EOM's intact   Lungs:  Clear to auscultation bilaterally, respirations unlabored   Heart:  Regular rate and rhythm, systolic murmur at rt sternal border   Abdomen:  Soft, non-tender, bowel sounds active all four quadrants   Extremities:  2+ firm pitting edema up to thigh, nonweeping pustules, normal capillary refill   Neurologic:  CNII-XII intact, normal strength, sensation and reflexes  throughout     Lab Results: Basic Metabolic Panel:  Recent Labs Lab 08/29/13 0235 08/30/13 0235  NA 128* 132*  K 3.1* 3.6*  CL 87* 92*  CO2 25 26  GLUCOSE 93 91  BUN 36* 35*  CREATININE 1.30* 1.13*  CALCIUM 8.2* 8.3*  MG  --  2.2   Liver Function Tests:  Recent Labs Lab 08/28/13 1330  AST 27  ALT 14  ALKPHOS 104  BILITOT 0.8  PROT 6.6  ALBUMIN 3.2*   CBC:  Recent Labs Lab 08/29/13 1435 08/30/13 0235  WBC 6.2 5.4  HGB 8.4* 7.1*  HCT 26.5* 22.4*  MCV 91.4 92.2  PLT 143* 124*   BNP:  Recent Labs Lab 08/28/13 1330  PROBNP 2110.0*    Micro Results: Recent Results (from the past 240 hour(s))  MRSA PCR SCREENING     Status: None   Collection Time    08/28/13  7:53 PM      Result Value Ref Range Status   MRSA by PCR NEGATIVE  NEGATIVE Final   Comment:            The GeneXpert MRSA Assay (FDA     approved for NASAL specimens     only), is one component of a     comprehensive MRSA colonization     surveillance program. It is not     intended to diagnose MRSA     infection nor to guide or     monitor treatment for     MRSA infections.   Studies/Results: Dg Chest 2 View  08/28/2013   CLINICAL DATA:  Weakness.  EXAM: CHEST  2 VIEW  COMPARISON:  CT 07/10/2013.  Chest x-ray 07/09/2013.  FINDINGS: Mediastinum and hilar structures normal. Prior CABG. Prior aortic valve replacement. Cardiomegaly with pulmonary vascular prominence and interstitial prominence with bilateral pleural effusions right side greater than left. Findings consistent with congestive heart failure. No pneumothorax. No acute bony abnormality.  IMPRESSION: 1. Congestive heart failure with pulmonary interstitial edema and pleural effusions.  2.  Prior CABG.  Aortic valve replacement.   Electronically Signed   By: Marcello Moores  Register   On: 08/28/2013 13:29  Medications: I have reviewed the patient's current medications. Scheduled Meds: . levothyroxine  200 mcg Oral QAC breakfast  . metolazone  2.5 mg Oral Once  . polyethylene glycol  17 g Oral BID  . potassium chloride  40 mEq Oral BID  . potassium chloride  40 mEq Oral Once  . senna-docusate  1 tablet Oral BID  . sodium chloride  3 mL Intravenous Q12H   Continuous Infusions: . sodium chloride 20 mL/hr at 08/30/13 0955  . furosemide (LASIX) infusion 10 mg/hr (08/29/13 1436)   PRN Meds:.acetaminophen, ondansetron (ZOFRAN) IV, ondansetron Assessment/Plan: Principal Problem:   Acute GI bleeding Active Problems:   Hypotension   Hypokalemia   Acute on chronic diastolic CHF (congestive heart failure)   AKI (acute kidney injury)   Acute posthemorrhagic anemia  BRBPR -- Pt has had to have 4 Units of blood transfused w/in the past 1-2 months. Pt had colo and EGD for anemia on 06/29/13 in [facility] that was positive for  moderate sigmoid diverticulosis, internal and external hemorrhoids. She has not had any bleeding since admission. - Hgb 7.1 today from 8.4 yesterday, will get repeat CBC, pt denies any bleeding - negative orthostatic vitals this morning - senekot for constipation, will add miralax - if patient notices bleeding again will proceed with vaginal exam as patient was unsure if she was bleeding from her rectum or vagina. She does note that she only noticed blood when wiping from behind than when wiping upfront.   Acute on chronic diastolic HF: ECHO on 6/2 EF 55%, moderate mitral stenosis, mild aortic stenosis. CXR- CHF w/ pulmonary interstitial edema and pleural effusions. BNP on admission 2110, on 07/24/13 was 837.4. On PE pt has 2+ firm pitting edema up to the thighs w/ facial swelling.  - HF team following, continuing lasix 10 mg/hr gtt, and metolazone 2.5mg  daily - will continue to monitor weights and get I/Os, weight on prior d/c was not her dry weight which is less than 173. 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 Pt is not a good candidate for cardiac surgery.   AKI --2/2 diuretics and hypovolema. Cr on admission 1.42, baseline around 0.80  -Cr 1.13 today, will continue to monitor.   Hyponatremia-- 2/2 to diuretics or HF. Na on admission 127, normally runs low in the 130's  - Na currently at baseline. Will continue to monitor.   Hypokalemia  -KDur 59mEq BID daily  DVT: SCD's  Dispo: Disposition is deferred at this time, awaiting improvement of current medical problems.  Anticipated discharge in approximately 1-2 day(s).   The patient does have a current PCP Rubie Maid, MD) and does need an [facility]  hospital follow-up appointment after discharge.  The patient does not have transportation limitations that hinder transportation to clinic appointments.  .Services Needed at time of discharge: Y = Yes, Blank = No PT:   OT:   RN:   Equipment:   Other:     LOS: 2 days   Julious Oka, MD 08/30/2013, 11:44 AM

## 2013-08-30 NOTE — Progress Notes (Signed)
Patient ID: Nicole Hansen, female   DOB: [birth year], 73 y.o.   MRN: [id number]   SUBJECTIVE: Patient diuresed well yesterday, creatinine stable.  No overt bleeding but hemoglobin down today.   Scheduled Meds: . levothyroxine  200 mcg Oral QAC breakfast  . metolazone  2.5 mg Oral Once  . potassium chloride  40 mEq Oral BID  . potassium chloride  40 mEq Oral Once  . senna-docusate  1 tablet Oral BID  . sodium chloride  3 mL Intravenous Q12H   Continuous Infusions: . furosemide (LASIX) infusion 10 mg/hr (08/29/13 1436)   PRN Meds:.acetaminophen, ondansetron (ZOFRAN) IV, ondansetron    Filed Vitals:   08/30/13 0000 08/30/13 0341 08/30/13 0600 08/30/13 0748  BP: 90/33 111/59 102/47 105/46  Pulse:    88  Temp:  98.5 F (36.9 C)  98.8 F (37.1 C)  TempSrc:  Oral  Oral  Resp: 19  24 16   Height:      Weight:  187 lb 12.8 oz (85.186 kg)    SpO2: 93% 94%  94%    Intake/Output Summary (Last 24 hours) at 08/30/13 0802 Last data filed at 08/30/13 0600  Gross per 24 hour  Intake    560 ml  Output   4150 ml  Net  -3590 ml    LABS: Basic Metabolic Panel:  Recent Labs  08/29/13 0235 08/30/13 0235  NA 128* 132*  K 3.1* 3.6*  CL 87* 92*  CO2 25 26  GLUCOSE 93 91  BUN 36* 35*  CREATININE 1.30* 1.13*  CALCIUM 8.2* 8.3*  MG  --  2.2   Liver Function Tests:  Recent Labs  08/28/13 1330  AST 27  ALT 14  ALKPHOS 104  BILITOT 0.8  PROT 6.6  ALBUMIN 3.2*   No results found for this basename: LIPASE, AMYLASE,  in the last 72 hours CBC:  Recent Labs  08/29/13 1435 08/30/13 0235  WBC 6.2 5.4  HGB 8.4* 7.1*  HCT 26.5* 22.4*  MCV 91.4 92.2  PLT 143* 124*   Cardiac Enzymes: No results found for this basename: CKTOTAL, CKMB, CKMBINDEX, TROPONINI,  in the last 72 hours BNP: No components found with this basename: POCBNP,  D-Dimer: No results found for this basename: DDIMER,  in the last 72 hours Hemoglobin A1C: No results found for this basename: HGBA1C,  in the last  72 hours Fasting Lipid Panel: No results found for this basename: CHOL, HDL, LDLCALC, TRIG, CHOLHDL, LDLDIRECT,  in the last 72 hours Thyroid Function Tests: No results found for this basename: TSH, T4TOTAL, FREET3, T3FREE, THYROIDAB,  in the last 72 hours Anemia Panel: No results found for this basename: VITAMINB12, FOLATE, FERRITIN, TIBC, IRON, RETICCTPCT,  in the last 72 hours  RADIOLOGY: Dg Chest 2 View  08/28/2013   CLINICAL DATA:  Weakness.  EXAM: CHEST  2 VIEW  COMPARISON:  CT 07/10/2013.  Chest x-ray 07/09/2013.  FINDINGS: Mediastinum and hilar structures normal. Prior CABG. Prior aortic valve replacement. Cardiomegaly with pulmonary vascular prominence and interstitial prominence with bilateral pleural effusions right side greater than left. Findings consistent with congestive heart failure. No pneumothorax. No acute bony abnormality.  IMPRESSION: 1. Congestive heart failure with pulmonary interstitial edema and pleural effusions.  2.  Prior CABG.  Aortic valve replacement.   Electronically Signed   By: Marcello Moores  Register   On: 08/28/2013 13:29    PHYSICAL EXAM General: NAD Neck: JVP 12-14 cm, no thyromegaly or thyroid nodule.  Lungs: Crackles at bases CV: Nondisplaced PMI.  Heart regular S1/S2, no S3/S4, 2/6 SEM RUSB.  2+ edema to thighs.   Abdomen: Soft, nontender, no hepatosplenomegaly, no distention.  Neurologic: Alert and oriented x 3.  Psych: Normal affect. Extremities: No clubbing or cyanosis.   TELEMETRY: Reviewed telemetry pt in NSR  ASSESSMENT AND PLAN: 73 yo with history of bioprosthetic AVR and diastolic CHF presented with GI bleeding and acute on chronic diastolic CHF.  1. Acute blood loss anemia: Patient has been evaluated by GI, suspect diverticular bleeding. No overt bleeding but hemoglobin down to 7.1.  Suspect she will need another unit PRBCs, per primary team. 2. Acute on chronic diastolic CHF: Patient has gradually built up fluid per her report, suspect this was  made worse by blood transfusions this admission. EF 55-60% on 6/15 echo. She diuresed well yesterday, weight is down 4 lbs.  - Continue Lasix gtt 10 mg/hr with metolazone 2.5 mg daily.  - Replete K aggressively.  3. Bioprosthetic aortic valve: Function appeared normal on prior echo in 6/15.  4. 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 I am concerned that this may play a role in her CHF. With significant calcification, she would not be a valvuloplasty candidate, and a redo sternotomy would be high risk. I talked to her about doing a TEE to assess the degree of mitral stenosis more closely to try to see how much this may be contributing to her CHF.  Will do this today.  5. CKD: Creatinine lower today, likely with lowering of renal venous pressure with diuresis.   [name] 08/30/2013 8:06 AM

## 2013-08-30 NOTE — OR Nursing (Signed)
Patient complained all through TEE of throat pain, no blood seen on Probe, Dr. Aundra Dubin aware

## 2013-08-31 ENCOUNTER — Encounter (HOSPITAL_COMMUNITY): Payer: Self-pay | Admitting: Cardiology

## 2013-08-31 LAB — BASIC METABOLIC PANEL
ANION GAP: 13 (ref 5–15)
Anion gap: 11 (ref 5–15)
BUN: 28 mg/dL — AB (ref 6–23)
BUN: 25 mg/dL — AB (ref 6–23)
CALCIUM: 8 mg/dL — AB (ref 8.4–10.5)
CALCIUM: 8.3 mg/dL — AB (ref 8.4–10.5)
CO2: 28 mEq/L (ref 19–32)
CO2: 31 mEq/L (ref 19–32)
CREATININE: 0.99 mg/dL (ref 0.50–1.10)
Chloride: 93 mEq/L — ABNORMAL LOW (ref 96–112)
Chloride: 94 mEq/L — ABNORMAL LOW (ref 96–112)
Creatinine, Ser: 0.94 mg/dL (ref 0.50–1.10)
GFR calc Af Amer: 68 mL/min — ABNORMAL LOW (ref >90)
GFR calc non Af Amer: 55 mL/min — ABNORMAL LOW (ref >90)
GFR calc non Af Amer: 59 mL/min — ABNORMAL LOW (ref >90)
GFR, EST AFRICAN AMERICAN: 64 mL/min — AB (ref >90)
GLUCOSE: 89 mg/dL (ref 70–99)
Glucose, Bld: 92 mg/dL (ref 70–99)
POTASSIUM: 3.6 meq/L — AB (ref 3.7–5.3)
Potassium: 3 mEq/L — ABNORMAL LOW (ref 3.7–5.3)
SODIUM: 136 meq/L — AB (ref 137–147)
Sodium: 134 mEq/L — ABNORMAL LOW (ref 137–147)

## 2013-08-31 LAB — CBC
HCT: 22.8 % — ABNORMAL LOW (ref 36.0–46.0)
HCT: 24.9 % — ABNORMAL LOW (ref 36.0–46.0)
HEMOGLOBIN: 7.1 g/dL — AB (ref 12.0–15.0)
Hemoglobin: 7.8 g/dL — ABNORMAL LOW (ref 12.0–15.0)
MCH: 29.7 pg (ref 26.0–34.0)
MCH: 30.4 pg (ref 26.0–34.0)
MCHC: 31.1 g/dL (ref 30.0–36.0)
MCHC: 31.3 g/dL (ref 30.0–36.0)
MCV: 95.4 fL (ref 78.0–100.0)
MCV: 96.9 fL (ref 78.0–100.0)
Platelets: 114 10*3/uL — ABNORMAL LOW (ref 150–400)
Platelets: 107 10*3/uL — ABNORMAL LOW (ref 150–400)
RBC: 2.39 MIL/uL — ABNORMAL LOW (ref 3.87–5.11)
RBC: 2.57 MIL/uL — AB (ref 3.87–5.11)
RDW: 17.4 % — AB (ref 11.5–15.5)
RDW: 17.3 % — AB (ref 11.5–15.5)
WBC: 4.7 10*3/uL (ref 4.0–10.5)
WBC: 4.2 10*3/uL (ref 4.0–10.5)

## 2013-08-31 MED ORDER — METOLAZONE 2.5 MG PO TABS
2.5000 mg | ORAL_TABLET | Freq: Every day | ORAL | Status: DC
  Administered 2013-08-31 – 2013-09-03 (×4): 2.5 mg via ORAL
  Filled 2013-08-31 (×5): qty 1

## 2013-08-31 MED ORDER — POTASSIUM CHLORIDE 10 MEQ/100ML IV SOLN
10.0000 meq | INTRAVENOUS | Status: AC
  Administered 2013-08-31 (×2): 10 meq via INTRAVENOUS
  Filled 2013-08-31 (×2): qty 100

## 2013-08-31 MED ORDER — POTASSIUM CHLORIDE CRYS ER 20 MEQ PO TBCR
40.0000 meq | EXTENDED_RELEASE_TABLET | Freq: Once | ORAL | Status: AC
  Administered 2013-08-31: 40 meq via ORAL
  Filled 2013-08-31: qty 2

## 2013-08-31 NOTE — Progress Notes (Signed)
Subjective: Pt lost her IJ site this morning although peripheral IV site is still in place. She was able to have BMs yesterday that she describes were at first firm then loose. She has hemorrhoids and reports bloody stools from this yesterday but denies gross blood. She felt great relief after BMs.   Objective: Vital signs in last 24 hours: Filed Vitals:   08/31/13 0600 08/31/13 0730 08/31/13 0752 08/31/13 0850  BP: 112/41   102/45  Pulse:    86  Temp:   98.6 F (37 C)   TempSrc:   Oral   Resp: 17 11  10   Height:      Weight:      SpO2: 97%      Weight change: -3 lb 12.8 oz (-1.724 kg)  Intake/Output Summary (Last 24 hours) at 08/31/13 1134 Last data filed at 08/31/13 1000  Gross per 24 hour  Intake    690 ml  Output   4925 ml  Net  -4235 ml   General Appearance:  Alert, NAD  Head:  Normocephalic, without obvious abnormality, atraumatic   Eyes:  EOM's intact   Lungs:  Clear to auscultation bilaterally, respirations unlabored   Heart:  Regular rate and rhythm, systolic murmur at rt sternal border   Abdomen:  Soft, non-tender, bowel sounds active all four quadrants   Extremities:  2+ firm pitting edema up to thigh, nonweeping pustules, normal capillary refill     Lab Results: Basic Metabolic Panel:  Recent Labs Lab 08/29/13 0235 08/30/13 0235 08/31/13 0240 08/31/13 1042  NA 128* 132* 134* 136*  K 3.1* 3.6* 3.0* 3.6*  CL 87* 92* 93* 94*  CO2 25 26 28 31   GLUCOSE 93 91 92 89  BUN 36* 35* 28* 25*  CREATININE 1.30* 1.13* 0.99 0.94  CALCIUM 8.2* 8.3* 8.0* 8.3*  MG  --  2.2  --   --    Liver Function Tests:  Recent Labs Lab 08/28/13 1330  AST 27  ALT 14  ALKPHOS 104  BILITOT 0.8  PROT 6.6  ALBUMIN 3.2*   CBC:  Recent Labs Lab 08/30/13 1340 08/31/13 0240  WBC 4.5 4.7  NEUTROABS 2.6  --   HGB 8.2* 7.1*  HCT 26.4* 22.8*  MCV 93.0 95.4  PLT 129* 114*   BNP:  Recent Labs Lab 08/28/13 1330  PROBNP 2110.0*    Medications: I have reviewed  the patient's current medications. Scheduled Meds: . gabapentin  300 mg Oral BID  . levothyroxine  200 mcg Oral QAC breakfast  . metolazone  2.5 mg Oral Daily  . polyethylene glycol  17 g Oral BID  . potassium chloride  40 mEq Oral BID  . senna-docusate  1 tablet Oral BID  . sodium chloride  3 mL Intravenous Q12H   Continuous Infusions: . sodium chloride 10 mL/hr at 08/30/13 1900  . furosemide (LASIX) infusion 10 mg/hr (08/31/13 1000)   PRN Meds:.acetaminophen, ondansetron (ZOFRAN) IV, ondansetron Assessment/Plan:  Acute on chronic diastolic HF: ECHO on 6/44 reveals EF 60%, mild/moderate AS, MR, and mitral stenosis. CXR- CHF w/ pulmonary interstitial edema and pleural effusions. BNP on admission 2110, on 07/24/13 was 837.4. On PE pt has 2+ firm pitting edema up to the thighs w/ facial swelling.  - HF team following, continuing lasix 10 mg/hr gtt, and metolazone 2.5mg  daily - will continue to monitor weights and get I/Os, weight on prior d/c was not her dry weight which is less than 173. Pt currently weighs 184lbs -will  continue diuresis w/ close monitor on renal fxn - pt transferred to tele unit  BRBPR -- Pt has had to have 4 Units of blood transfused w/in the past 1-2 months. Pt had colo and EGD for anemia on 06/29/13 in [facility] that was positive for moderate sigmoid diverticulosis, internal and external hemorrhoids. She has not had any bleeding since admission. - Hgb 7.1 today from 8.2 yesterday, will get repeat CBC, pt admits to bloody BMs yesterday but denies frank gross blood -transfuse when hgb <7  AKI --2/2 diuretics and hypovolema. Cr on admission 1.42, baseline around 0.80  -Cr 0.94 today, will continue to monitor.   Hypokalemia  -KDur 43mEq BID daily, in addition to 4 runs of 60mEq KCl this morning due to K+ of 3.0  Hyponatremia-- 2/2 to diuretics or HF. Na on admission 127, normally runs low in the 130's  - Na currently at baseline. Will continue to monitor.   DVT:  SCD's  Dispo: Disposition is deferred at this time, awaiting improvement of current medical problems.  Anticipated discharge in approximately 1-2 day(s).   The patient does have a current PCP Rubie Maid, MD) and does need an [facility] hospital follow-up appointment after discharge.  The patient does not have transportation limitations that hinder transportation to clinic appointments.  .Services Needed at time of discharge: Y = Yes, Blank = No PT:   OT:   RN:   Equipment:   Other:     LOS: 3 days   Julious Oka, MD 08/31/2013, 11:34 AM

## 2013-08-31 NOTE — Progress Notes (Signed)
CARDIAC REHAB PHASE I   PRE:  Rate/Rhythm: 91 SR  BP:  Supine:   Sitting: 88/36  Standing:    SaO2: 100%RA  MODE:  Ambulation: 350 ft   POST:  Rate/Rhythm: 103 ST  BP:  Supine:   Sitting: 93/28  Standing:    SaO2: 98%RA 1145-1234 Pt walked 350 ft on RA with rolling walker and asst x 1. A little unsteady but tolerated well. Was able to talk during walk without SOB. To bathroom after walk and pt had bright red BM. Let pt's RN see. Pt stated normal for her. To recliner with call bell. No dizziness. Tolerated well.   Graylon Good, RN BSN  08/31/2013 12:31 PM

## 2013-08-31 NOTE — Progress Notes (Signed)
Patient ID: Nicole Hansen, female   DOB: Feb 22, 1940, 73 y.o.   MRN: [id number]   SUBJECTIVE: Patient diuresed well again yesterday, creatinine stable.  No overt bleeding but hemoglobin down today again.   Scheduled Meds: . gabapentin  300 mg Oral BID  . levothyroxine  200 mcg Oral QAC breakfast  . polyethylene glycol  17 g Oral BID  . potassium chloride  10 mEq Intravenous Q1 Hr x 4  . potassium chloride  40 mEq Oral BID  . senna-docusate  1 tablet Oral BID  . sodium chloride  3 mL Intravenous Q12H   Continuous Infusions: . sodium chloride 10 mL/hr at 08/30/13 1900  . furosemide (LASIX) infusion 10 mg/hr (08/31/13 0736)   PRN Meds:.acetaminophen, ondansetron (ZOFRAN) IV, ondansetron    Filed Vitals:   08/31/13 0407 08/31/13 0600 08/31/13 0730 08/31/13 0752  BP: 107/51 112/41    Pulse:      Temp: 98.1 F (36.7 C)   98.6 F (37 C)  TempSrc: Oral   Oral  Resp:  17 11   Height:      Weight: 184 lb (83.462 kg)     SpO2: 98% 97%      Intake/Output Summary (Last 24 hours) at 08/31/13 0759 Last data filed at 08/31/13 0736  Gross per 24 hour  Intake    666 ml  Output   4975 ml  Net  -4309 ml    LABS: Basic Metabolic Panel:  Recent Labs  08/29/13 0235 08/30/13 0235 08/31/13 0240  NA 128* 132* 134*  K 3.1* 3.6* 3.0*  CL 87* 92* 93*  CO2 25 26 28   GLUCOSE 93 91 92  BUN 36* 35* 28*  CREATININE 1.30* 1.13* 0.99  CALCIUM 8.2* 8.3* 8.0*  MG  --  2.2  --    Liver Function Tests:  Recent Labs  08/28/13 1330  AST 27  ALT 14  ALKPHOS 104  BILITOT 0.8  PROT 6.6  ALBUMIN 3.2*   No results found for this basename: LIPASE, AMYLASE,  in the last 72 hours CBC:  Recent Labs  08/30/13 1340 08/31/13 0240  WBC 4.5 4.7  NEUTROABS 2.6  --   HGB 8.2* 7.1*  HCT 26.4* 22.8*  MCV 93.0 95.4  PLT 129* 114*   Cardiac Enzymes: No results found for this basename: CKTOTAL, CKMB, CKMBINDEX, TROPONINI,  in the last 72 hours BNP: No components found with this basename:  POCBNP,  D-Dimer: No results found for this basename: DDIMER,  in the last 72 hours Hemoglobin A1C: No results found for this basename: HGBA1C,  in the last 72 hours Fasting Lipid Panel: No results found for this basename: CHOL, HDL, LDLCALC, TRIG, CHOLHDL, LDLDIRECT,  in the last 72 hours Thyroid Function Tests: No results found for this basename: TSH, T4TOTAL, FREET3, T3FREE, THYROIDAB,  in the last 72 hours Anemia Panel: No results found for this basename: VITAMINB12, FOLATE, FERRITIN, TIBC, IRON, RETICCTPCT,  in the last 72 hours  RADIOLOGY: Dg Chest 2 View  08/28/2013   CLINICAL DATA:  Weakness.  EXAM: CHEST  2 VIEW  COMPARISON:  CT 07/10/2013.  Chest x-ray 07/09/2013.  FINDINGS: Mediastinum and hilar structures normal. Prior CABG. Prior aortic valve replacement. Cardiomegaly with pulmonary vascular prominence and interstitial prominence with bilateral pleural effusions right side greater than left. Findings consistent with congestive heart failure. No pneumothorax. No acute bony abnormality.  IMPRESSION: 1. Congestive heart failure with pulmonary interstitial edema and pleural effusions.  2.  Prior CABG.  Aortic valve  replacement.   Electronically Signed   By: Marcello Moores  Register   On: 08/28/2013 13:29    PHYSICAL EXAM General: NAD Neck: JVP 12-14 cm, no thyromegaly or thyroid nodule.  Lungs: Crackles at bases CV: Nondisplaced PMI.  Heart regular S1/S2, no S3/S4, 2/6 SEM RUSB.  1+ edema to thighs.   Abdomen: Soft, nontender, no hepatosplenomegaly, no distention.  Neurologic: Alert and oriented x 3.  Psych: Normal affect. Extremities: No clubbing or cyanosis.   TELEMETRY: Reviewed telemetry pt in NSR  ASSESSMENT AND PLAN: 73 yo with history of bioprosthetic AVR and diastolic CHF presented with GI bleeding and acute on chronic diastolic CHF.  1. Acute blood loss anemia: Patient has been evaluated by GI, suspect diverticular bleeding. No overt bleeding but hemoglobin down to 7.1 again  this morning.  Sending repeat, transfuse hemoglobin < 7. 2. Acute on chronic diastolic CHF: Patient has gradually built up fluid per her report, suspect this was made worse by blood transfusions this admission. EF 55-60% on 6/15 echo. She diuresed well yesterday again, weight down another 3 lbs.  She still has significant volume overload on exam.  - Continue Lasix gtt 10 mg/hr with metolazone 2.5 mg daily.  - Replete K aggressively (already ordered today).  3. Bioprosthetic aortic valve: On TEE yesterday, the bioprosthetic valve opened normally but was small.  Mean gradient 20 mmHg suggests perhaps mild to moderate AS from patient-prosthetic mismatch.    4. Mitral stenosis: TEE yesterday showed mild to moderate MR with heavy MAC and a restricted posterior leaflet.  The mean gradient across the valve was 10 mmHg but MVA by PHT was > 2.  I suspect that the elevated mean gradient may be more due to high flow (based on MVA by PHT) and that mitral stenosis is probably more mild.   5. CKD: Creatinine lower today, likely with lowering of renal venous pressure with diuresis.   [name] 08/31/2013 7:59 AM

## 2013-08-31 NOTE — Progress Notes (Signed)
Internal Medicine Attending  Date: 08/31/2013  Patient name: [place] record number: [id number] Date of birth: Sep 14, 1940 Age: 73 y.o. Gender: female  I saw and evaluated the patient. I developed the assessment and plan with the housestaff and reviewed the resident's note by Dr. Hulen Luster.  I agree with the resident's findings and plans as documented in her progress note.  Ms. Myhre feels wonderful today, especially after her bowel movement. She denies any shortness of breath at rest and has been able to ambulate. After an initial hard bowel movement she did have some bright red blood per rectum which she attributed to her hemorrhoids. Her hemoglobin has fallen approximately 1 point and we are reassessing to see if the drop is true. Abdominal exam is notable for decreased distention, soft and nontender to palpation. She continues to have lower extremity edema and is total body volume overloaded. Despite this, she has been losing weight on the IV Lasix drip with metolazone. She is stable for transfer to a general medical ward while continuing to receive the IV Lasix and metolazone. We will continue aggressive diuresis as long as she permits, but we may not keep her until she is at dry weight given the concern that we may induce a significant hypotension in a woman with predominantly diastolic heart failure as we approach her dry weight.

## 2013-09-01 DIAGNOSIS — E876 Hypokalemia: Secondary | ICD-10-CM

## 2013-09-01 DIAGNOSIS — I359 Nonrheumatic aortic valve disorder, unspecified: Secondary | ICD-10-CM

## 2013-09-01 LAB — CBC
HCT: 24.5 % — ABNORMAL LOW (ref 36.0–46.0)
Hemoglobin: 7.4 g/dL — ABNORMAL LOW (ref 12.0–15.0)
MCH: 28.9 pg (ref 26.0–34.0)
MCHC: 30.2 g/dL (ref 30.0–36.0)
MCV: 95.7 fL (ref 78.0–100.0)
PLATELETS: 103 10*3/uL — AB (ref 150–400)
RBC: 2.56 MIL/uL — ABNORMAL LOW (ref 3.87–5.11)
RDW: 17 % — ABNORMAL HIGH (ref 11.5–15.5)
WBC: 5.4 10*3/uL (ref 4.0–10.5)

## 2013-09-01 LAB — BASIC METABOLIC PANEL
ANION GAP: 14 (ref 5–15)
BUN: 27 mg/dL — ABNORMAL HIGH (ref 6–23)
CALCIUM: 8.6 mg/dL (ref 8.4–10.5)
CO2: 29 mEq/L (ref 19–32)
CREATININE: 0.97 mg/dL (ref 0.50–1.10)
Chloride: 94 mEq/L — ABNORMAL LOW (ref 96–112)
GFR calc non Af Amer: 57 mL/min — ABNORMAL LOW (ref >90)
GFR, EST AFRICAN AMERICAN: 66 mL/min — AB (ref >90)
Glucose, Bld: 94 mg/dL (ref 70–99)
Potassium: 3.1 mEq/L — ABNORMAL LOW (ref 3.7–5.3)
SODIUM: 137 meq/L (ref 137–147)

## 2013-09-01 MED ORDER — POTASSIUM CHLORIDE 10 MEQ/100ML IV SOLN
10.0000 meq | INTRAVENOUS | Status: AC
  Administered 2013-09-01 (×4): 10 meq via INTRAVENOUS
  Filled 2013-09-01 (×4): qty 100

## 2013-09-01 MED ORDER — POTASSIUM CHLORIDE CRYS ER 20 MEQ PO TBCR
40.0000 meq | EXTENDED_RELEASE_TABLET | Freq: Three times a day (TID) | ORAL | Status: DC
  Administered 2013-09-01 – 2013-09-02 (×6): 40 meq via ORAL
  Filled 2013-09-01 (×7): qty 2

## 2013-09-01 MED ORDER — SORBITOL 70 % SOLN
15.0000 mL | Freq: Every morning | Status: DC
  Administered 2013-09-01: 15 mL via ORAL
  Filled 2013-09-01 (×2): qty 30

## 2013-09-01 NOTE — Progress Notes (Signed)
Internal Medicine Attending  Date: 09/01/2013  Patient name: Nicole Hansen record number: [id number] Date of birth: 1940/03/05 Age: 73 y.o. Gender: female  I saw and evaluated the patient. I developed the assessment and plan with the housestaff and reviewed the resident's note by Dr. Hulen Hansen.  I agree with the resident's findings and plans as documented in her progress note.  Nicole Hansen feels very good today and was able to more easily ambulate around the halls this morning. She states her breathing is easy. She did not have any blood in her most recent bowel movement. Her bowel habits are more consistent and this has caused her great joy. We discussed possibly trying sorbitol 70% 1-5 tablespoons daily adjusted to the desired number of bowel movements. She seemed interested and we will start this while in inpatient. Otherwise, we will continue with our aggressive diuresis in hopes of getting her down near her dry weight.

## 2013-09-01 NOTE — Progress Notes (Signed)
CARDIAC REHAB PHASE I   PRE:  Rate/Rhythm: 88 sinus rhythm, first degree AV block, BBB  BP:  Supine:   Sitting: 118/68  Standing:    SaO2: 100% room air  MODE:  Ambulation: 350 ft   POST:  Rate/Rhythem: 93 sinus rhythm  BP:  Supine:   Sitting: 124/68  Standing:    SaO2: 97% room air 1040-1115 Pt ambulated in hallway x1 assist using rolling walker.  Slow steady gait.  Pt returned to room to sponge bath in sink before returning to chair.  Pt tolerated well, asymptomatic.  Pt call light in reach.   Myleah Cavendish, [place]

## 2013-09-01 NOTE — Progress Notes (Signed)
Subjective: Pt feeling great today, no complaints. Reports she had a BM last night w/o any bloody stools.   Objective: Vital signs in last 24 hours: Filed Vitals:   08/31/13 1851 08/31/13 2005 09/01/13 0201 09/01/13 0514  BP:  108/51 91/39 104/34  Pulse:  93 91 86  Temp:  98.2 F (36.8 C) 98.2 F (36.8 C) 98.3 F (36.8 C)  TempSrc:  Oral Oral Oral  Resp: 17 18 18 18   Height:  5\' 8"  (1.727 m)    Weight:  182 lb 1.6 oz (82.6 kg)  180 lb 14.4 oz (82.056 kg)  SpO2:  100% 98% 96%   Weight change: -1 lb 14.4 oz (-0.862 kg)  Intake/Output Summary (Last 24 hours) at 09/01/13 0755 Last data filed at 09/01/13 0520  Gross per 24 hour  Intake 550.67 ml  Output   3725 ml  Net -3174.33 ml   General Appearance:  Alert, NAD  Head:  Normocephalic, without obvious abnormality, atraumatic   Eyes:  EOM's intact   Lungs:  Clear to auscultation bilaterally, respirations unlabored   Heart:  Regular rate and rhythm, systolic murmur at rt sternal border   Abdomen:  Soft, non-tender, bowel sounds active all four quadrants   Extremities:  2+ firm pitting edema up to thigh, nonweeping pustules, normal capillary refill     Lab Results: Basic Metabolic Panel:  Recent Labs Lab 08/29/13 0235 08/30/13 0235  08/31/13 1042 09/01/13 0358  NA 128* 132*  < > 136* 137  K 3.1* 3.6*  < > 3.6* 3.1*  CL 87* 92*  < > 94* 94*  CO2 25 26  < > 31 29  GLUCOSE 93 91  < > 89 94  BUN 36* 35*  < > 25* 27*  CREATININE 1.30* 1.13*  < > 0.94 0.97  CALCIUM 8.2* 8.3*  < > 8.3* 8.6  MG  --  2.2  --   --   --   < > = values in this interval not displayed. Liver Function Tests:  Recent Labs Lab 08/28/13 1330  AST 27  ALT 14  ALKPHOS 104  BILITOT 0.8  PROT 6.6  ALBUMIN 3.2*   CBC:  Recent Labs Lab 08/30/13 1340 08/31/13 0240 08/31/13 1339  WBC 4.5 4.7 4.2  NEUTROABS 2.6  --   --   HGB 8.2* 7.1* 7.8*  HCT 26.4* 22.8* 24.9*  MCV 93.0 95.4 96.9  PLT 129* 114* 107*   BNP:  Recent Labs Lab  08/28/13 1330  PROBNP 2110.0*    Medications: I have reviewed the patient's current medications. Scheduled Meds: . gabapentin  300 mg Oral BID  . levothyroxine  200 mcg Oral QAC breakfast  . metolazone  2.5 mg Oral Daily  . polyethylene glycol  17 g Oral BID  . potassium chloride  10 mEq Intravenous Q1 Hr x 4  . potassium chloride  40 mEq Oral BID  . senna-docusate  1 tablet Oral BID  . sodium chloride  3 mL Intravenous Q12H   Continuous Infusions: . furosemide (LASIX) infusion 10 mg/hr (08/31/13 2312)   PRN Meds:.acetaminophen, ondansetron (ZOFRAN) IV, ondansetron Assessment/Plan:  Acute on chronic diastolic HF: ECHO on 9/92 reveals EF 60%, mild/moderate AS, MR, and mitral stenosis. CXR- CHF w/ pulmonary interstitial edema and pleural effusions. BNP on admission 2110, on 07/24/13 was 837.4.  - HF team following, continuing lasix 10 mg/hr gtt, and metolazone 2.5mg  daily - will continue to monitor weights and get I/Os, weight on prior d/c  was not her dry weight which is less than 173. Pt currently weighs 180lbs -will continue diuresis w/ close monitor on renal fxn  BRBPR -- diverticular vs hemorrhoidal bleed that has resolved.  - Hgb 7.4 today from 7.8 yesterday, asymptomatic   -transfuse when hgb <7  AKI --2/2 diuretics and hypovolema. Cr on admission 1.42, baseline around 0.80  -Cr 0.97 today, resolving,  will continue to monitor.   Hypokalemia  -KDur 43mEq BID daily, in addition to 4 runs of 79mEq KCl this morning due to K+ of 3.1 - will change KDur to 43mEq TID  DVT: SCD's  Dispo: Disposition is deferred at this time, awaiting improvement of current medical problems.  Anticipated discharge in approximately 1-2 day(s).   The patient does have a current PCP Rubie Maid, MD) and does need an [facility] hospital follow-up appointment after discharge.  The patient does not have transportation limitations that hinder transportation to clinic appointments.  .Services Needed at  time of discharge: Y = Yes, Blank = No PT:   OT:   RN:   Equipment:   Other:     LOS: 4 days   Julious Oka, MD 09/01/2013, 7:55 AM

## 2013-09-01 NOTE — Progress Notes (Signed)
Primary cardiologist: Dr. Wyline Copas (also followed by CHF team)  Subjective:   Up in bedside chair. States she ambulated in the halls and did fairly well. Improving leg edema.   Objective:   Temp:  [98.2 F (36.8 C)-98.6 F (37 C)] 98.3 F (36.8 C) (07/25 0514) Pulse Rate:  [86-93] 86 (07/25 0514) Resp:  [17-30] 18 (07/25 0514) BP: (91-108)/(34-51) 104/34 mmHg (07/25 0514) SpO2:  [96 %-100 %] 96 % (07/25 0514) Weight:  [180 lb 14.4 oz (82.056 kg)-182 lb 1.6 oz (82.6 kg)] 180 lb 14.4 oz (82.056 kg) (07/25 0514) Last BM Date: 08/31/13  Filed Weights   08/31/13 0407 08/31/13 2005 09/01/13 0514  Weight: 184 lb (83.462 kg) 182 lb 1.6 oz (82.6 kg) 180 lb 14.4 oz (82.056 kg)    Intake/Output Summary (Last 24 hours) at 09/01/13 1155 Last data filed at 09/01/13 1034  Gross per 24 hour  Intake 696.67 ml  Output   3100 ml  Net -2403.33 ml    Telemetry: Sinus rhythm.  Exam:  General: Appears comfortable at rest.  Lungs: Crackles at bases. Nonlabored breathing.  Cardiac: Elevated JVP, RRR with 2/6 systolic murmur at the base.  Abdomen: NABS.  Extremities: 1-2+ firm edema.   Lab Results:  Basic Metabolic Panel:  Recent Labs Lab 08/29/13 0235 08/30/13 0235 08/31/13 0240 08/31/13 1042 09/01/13 0358  NA 128* 132* 134* 136* 137  K 3.1* 3.6* 3.0* 3.6* 3.1*  CL 87* 92* 93* 94* 94*  CO2 25 26 28 31 29   GLUCOSE 93 91 92 89 94  BUN 36* 35* 28* 25* 27*  CREATININE 1.30* 1.13* 0.99 0.94 0.97  CALCIUM 8.2* 8.3* 8.0* 8.3* 8.6  MG  --  2.2  --   --   --     CBC:  Recent Labs Lab 08/31/13 0240 08/31/13 1339 09/01/13 0857  WBC 4.7 4.2 5.4  HGB 7.1* 7.8* 7.4*  HCT 22.8* 24.9* 24.5*  MCV 95.4 96.9 95.7  PLT 114* 107* 103*    Transesophageal echocardiogram 08/30/2013: Study Conclusions  - Left ventricle: The cavity size was normal. Wall thickness was normal. Systolic function was normal. The estimated ejection fraction was in the range of 60% to 65%. The  interventricular septum was D-shaped, suggesting RV pressure/volume overload. Wall motion was normal; there were no regional wall motion abnormalities. - Aortic valve: There was a bioprosthetic aortic valve. This valve opened well but was small. Mean gradient across the valve was 20 mmHg, suggesting mild-moderate stenosis, likely due to a degree of patient-prosthesis mismatch. There was no regurgitation. - Aorta: Normal caliber thoracic aorta with mild plaque. - Mitral valve: The mitral valve annulus was severely calcified and the posterior mitral leaflet was heavily calcified and restricted. The anterior leaflet moved relatively normally. Mean gradient was 10 mmHg but MVA was > 2 by PHT. I suspect that mitral stenosis is probably mild, with elevated mean gradient perhaps more due to high flow. There was mild to moderate MR. - Left atrium: The atrium was moderately dilated. No evidence of thrombus in the atrial cavity or appendage. - Right ventricle: The cavity size was mildly dilated. Systolic function was normal. - Atrial septum: No defect or patent foramen ovale was identified. - Tricuspid valve: There was moderate regurgitation. RV-RA gradient 25 mmHg.    Medications:   Scheduled Medications: . gabapentin  300 mg Oral BID  . levothyroxine  200 mcg Oral QAC breakfast  . metolazone  2.5 mg Oral Daily  . polyethylene glycol  17 g Oral BID  . potassium chloride  10 mEq Intravenous Q1 Hr x 4  . potassium chloride  40 mEq Oral TID  . senna-docusate  1 tablet Oral BID  . sodium chloride  3 mL Intravenous Q12H     Infusions: . furosemide (LASIX) infusion 10 mg/hr (08/31/13 2312)     PRN Medications:  acetaminophen, ondansetron (ZOFRAN) IV, ondansetron   Assessment:   1. Acute on chronic diastolic heart failure, currently on Lasix drip with metolazone. Continuing to diuresis well.   2. 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  3. Overall mild mitral stenosis by recent TEE.  4. 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  5. Acute blood loss anemia, suspected diverticular bleed. Hemoglobin 7.4.  6. Hypokalemia.   Plan/Discussion:    Continue current diuretic regimen. Replete potassium. Patient is diuresing well and renal function is stable. Would continue to follow CBC, could consider PRBC transfusion if hemoglobin gets under 7.   Satira Sark, M.D., F.A.C.C.

## 2013-09-02 DIAGNOSIS — D696 Thrombocytopenia, unspecified: Secondary | ICD-10-CM

## 2013-09-02 LAB — CBC
HCT: 21.6 % — ABNORMAL LOW (ref 36.0–46.0)
HCT: 21.5 % — ABNORMAL LOW (ref 36.0–46.0)
Hemoglobin: 6.6 g/dL — CL (ref 12.0–15.0)
Hemoglobin: 6.7 g/dL — CL (ref 12.0–15.0)
MCH: 29.6 pg (ref 26.0–34.0)
MCH: 29.9 pg (ref 26.0–34.0)
MCHC: 30.6 g/dL (ref 30.0–36.0)
MCHC: 31.2 g/dL (ref 30.0–36.0)
MCV: 96.9 fL (ref 78.0–100.0)
MCV: 96 fL (ref 78.0–100.0)
PLATELETS: 86 10*3/uL — AB (ref 150–400)
Platelets: 88 10*3/uL — ABNORMAL LOW (ref 150–400)
RBC: 2.23 MIL/uL — ABNORMAL LOW (ref 3.87–5.11)
RBC: 2.24 MIL/uL — ABNORMAL LOW (ref 3.87–5.11)
RDW: 16.5 % — ABNORMAL HIGH (ref 11.5–15.5)
RDW: 16.6 % — ABNORMAL HIGH (ref 11.5–15.5)
WBC: 4.6 10*3/uL (ref 4.0–10.5)
WBC: 5.4 10*3/uL (ref 4.0–10.5)

## 2013-09-02 LAB — BASIC METABOLIC PANEL
ANION GAP: 12 (ref 5–15)
BUN: 27 mg/dL — ABNORMAL HIGH (ref 6–23)
CHLORIDE: 92 meq/L — AB (ref 96–112)
CO2: 30 mEq/L (ref 19–32)
Calcium: 8.6 mg/dL (ref 8.4–10.5)
Creatinine, Ser: 0.9 mg/dL (ref 0.50–1.10)
GFR, EST AFRICAN AMERICAN: 72 mL/min — AB (ref >90)
GFR, EST NON AFRICAN AMERICAN: 62 mL/min — AB (ref >90)
Glucose, Bld: 92 mg/dL (ref 70–99)
POTASSIUM: 3.2 meq/L — AB (ref 3.7–5.3)
SODIUM: 134 meq/L — AB (ref 137–147)

## 2013-09-02 LAB — PREPARE RBC (CROSSMATCH)

## 2013-09-02 LAB — DIRECT ANTIGLOBULIN TEST (NOT AT ARMC)
DAT, IGG: NEGATIVE
DAT, complement: NEGATIVE

## 2013-09-02 LAB — MAGNESIUM: Magnesium: 1.8 mg/dL (ref 1.5–2.5)

## 2013-09-02 LAB — TECHNOLOGIST SMEAR REVIEW

## 2013-09-02 LAB — SAVE SMEAR

## 2013-09-02 MED ORDER — SORBITOL 70 % SOLN
15.0000 mL | Freq: Every day | Status: DC
  Administered 2013-09-02 – 2013-09-04 (×3): 15 mL via ORAL
  Filled 2013-09-02 (×4): qty 30

## 2013-09-02 MED ORDER — SORBITOL 70 % SOLN: 15.0000 mL | Freq: Every day | Status: DC | PRN

## 2013-09-02 MED ORDER — MAGNESIUM SULFATE 40 MG/ML IJ SOLN
2.0000 g | Freq: Once | INTRAMUSCULAR | Status: AC
  Administered 2013-09-02: 2 g via INTRAVENOUS
  Filled 2013-09-02: qty 50

## 2013-09-02 NOTE — Progress Notes (Signed)
CRITICAL VALUE ALERT  Critical value received:  Hgb 6.6  Date of notification:  09/02/2013  Time of notification:  0525  Critical value read back:Yes.    Nurse who received alert:  Orlean Patten, RN  MD notified (1st page):  Internal Medicine  Time of first page:  0532  MD notified (2nd page):  Time of second page:  Responding MD:  Internal Medicine  Time MD responded:  [phone]

## 2013-09-02 NOTE — Progress Notes (Signed)
PT. Alert oriented, no c/o pain or dizziness, pt. Voiding ok, v/s stable, SR on the monitor. Will continue to monitor patient.

## 2013-09-02 NOTE — Progress Notes (Signed)
Subjective: Nicole Hansen was seen and examined at bedside this morning.  She reports feeling great again today with no complaints. She denies any recent blood BMs. Unclear why her Hb is dropping. No CP or SOB.   Objective: Vital signs in last 24 hours: Filed Vitals:   09/02/13 0613 09/02/13 0804 09/02/13 0805 09/02/13 0808  BP: 90/48 93/63 106/46 103/49  Pulse: 80 86 86 89  Temp:  98.1 F (36.7 C) 98.1 F (36.7 C) 98.1 F (36.7 C)  TempSrc:  Oral Oral Oral  Resp:  18 18 18   Height:      Weight:      SpO2:  99% 99% 100%   Weight change: -2 lb 6.8 oz (-1.1 kg)  Intake/Output Summary (Last 24 hours) at 09/02/13 1120 Last data filed at 09/02/13 1000  Gross per 24 hour  Intake    840 ml  Output   5225 ml  Net  -4385 ml   Vitals reviewed. General: sitting in chair, NAD HEENT: EOMI Cardiac: RRR, systolic mumur at lt sternal border Pulm: clear to auscultation b/l  Abd: soft, nontender, nondistended, BS present Ext: wrinkled lower extremities but pitting edema +2 still present up to upper thighs Neuro: alert and oriented X3, strength and sensation grossly intact  Lab Results: Basic Metabolic Panel:  Recent Labs Lab 08/30/13 0235  09/01/13 0358 09/02/13 0400  NA 132*  < > 137 134*  K 3.6*  < > 3.1* 3.2*  CL 92*  < > 94* 92*  CO2 26  < > 29 30  GLUCOSE 91  < > 94 92  BUN 35*  < > 27* 27*  CREATININE 1.13*  < > 0.97 0.90  CALCIUM 8.3*  < > 8.6 8.6  MG 2.2  --   --  1.8  < > = values in this interval not displayed. Liver Function Tests:  Recent Labs Lab 08/28/13 1330  AST 27  ALT 14  ALKPHOS 104  BILITOT 0.8  PROT 6.6  ALBUMIN 3.2*   CBC:  Recent Labs Lab 08/30/13 1340  09/01/13 0857 09/02/13 0400  WBC 4.5  < > 5.4 4.6  NEUTROABS 2.6  --   --   --   HGB 8.2*  < > 7.4* 6.6*  HCT 26.4*  < > 24.5* 21.6*  MCV 93.0  < > 95.7 96.9  PLT 129*  < > 103* 86*  < > = values in this interval not displayed. BNP:  Recent Labs Lab 08/28/13 1330  PROBNP  2110.0*   Medications: I have reviewed the patient's current medications. Scheduled Meds: . gabapentin  300 mg Oral BID  . levothyroxine  200 mcg Oral QAC breakfast  . metolazone  2.5 mg Oral Daily  . potassium chloride  40 mEq Oral TID  . sodium chloride  3 mL Intravenous Q12H  . sorbitol  15 mL Oral Daily   Continuous Infusions: . furosemide (LASIX) infusion 10 mg/hr (09/02/13 0110)   PRN Meds:.acetaminophen, ondansetron (ZOFRAN) IV, ondansetron Assessment/Plan:  Acute on chronic diastolic HF with CKD 2: diuresing well on lasix gtt and metolazone.  ECHO on 7/23 reveals EF 60%, mild/moderate AS, MR, and mitral stenosis. Renal function remains stable with slight bump on BUN to 27 but Cr still <1. GFR improved to 62 today.  - appreciate HF team following, continuing lasix gtt and metolazone - will continue to monitor weights and get I/Os, weight on prior d/c was not her dry weight which is less than 173.  Pt currently down to 179lb's and net neg 15.8L since admission - trend bmet, replace K - continue to ambulate patient  BRBPR-- diverticular vs hemorrhoidal bleed that has apparently resolved but Hb down again today. Hgb 7.4 yesterday to 6.6 this morning.  Asymptomatic and not orthostatic. Has received total of 3 units PRBC this admission for Hb down to 5.5 on admission.  - recheck cbc around 2pm today - type and screened with 1 unit prepared overnight - will hold on transfusing at this time given no symptoms, however, may need to do so if symptomatic or Hb continues to trend significantly lower and reconsult GI for further evaluation  Hypokalemia--K 3.2 today.  - on KDur to 63mEq TID, Mag 1.8, will replace Mag today as well  Thrombocytopenia--Platelets continue to trend down to 86 today from 154 on admission. Have been low since June 2015 in our records <150 usually. - not on heparin - will continue to monitor for now  DVT: SCD's Diet: HH Dispo: Disposition is deferred at this  time, awaiting improvement of current medical problems.  Anticipated discharge in approximately 1-2 day(s).   The patient does have a current PCP Rubie Maid, MD) and does need an [facility] hospital follow-up appointment after discharge.  The patient does not have transportation limitations that hinder transportation to clinic appointments.  Services Needed at time of discharge: Y = Yes, Blank = No PT:   OT:   RN:   Equipment:   Other:     LOS: 5 days   Jerene Pitch, MD 09/02/2013, 11:20 AM

## 2013-09-02 NOTE — Progress Notes (Signed)
Internal Medicine Attending  Date: 09/02/2013  Patient name: Nicole Hansen record number: [id number] Date of birth: December 22, 1940 Age: 73 y.o. Gender: female  I saw and evaluated the patient. I developed the assessment and plan with the housestaff and reviewed the resident's note by Dr. Hulen Luster.  I agree with the resident's findings and plans as documented in her progress note.  Nicole Hansen continues to feel good from a pulmonary standpoint and is breathing fine. Her only complaint today is an increase in her bowel movements after starting sorbitol. She apparently has had no bleeding per rectum although her hemoglobin is continuing to drop. We will continue with the aggressive IV diuresis and repeat the hemoglobin this afternoon. If her hemoglobin is decreased below 7 we will consult GI in the morning to reassess the need for endoscopy. We are also adjusting her laxative regimen to include just sorbitol 1 tablespoon each morning in coffee water or juice.

## 2013-09-02 NOTE — Progress Notes (Signed)
Primary cardiologist: Dr. Wyline Copas (also followed by CHF team)  Subjective:   Increased stool frequency late yesterday after laxative. She has had no dizziness including today while standing in room.   Objective:   Temp:  [97.6 F (36.4 C)-98.3 F (36.8 C)] 98.1 F (36.7 C) (07/26 0808) Pulse Rate:  [80-93] 89 (07/26 0808) Resp:  [18-20] 18 (07/26 0808) BP: (90-114)/(40-63) 103/49 mmHg (07/26 0808) SpO2:  [91 %-100 %] 100 % (07/26 0808) Weight:  [179 lb 10.8 oz (81.5 kg)] 179 lb 10.8 oz (81.5 kg) (07/26 0450) Last BM Date: 09/01/13  Filed Weights   08/31/13 2005 09/01/13 0514 09/02/13 0450  Weight: 182 lb 1.6 oz (82.6 kg) 180 lb 14.4 oz (82.056 kg) 179 lb 10.8 oz (81.5 kg)    Intake/Output Summary (Last 24 hours) at 09/02/13 1125 Last data filed at 09/02/13 1000  Gross per 24 hour  Intake    840 ml  Output   5225 ml  Net  -4385 ml    Telemetry: Sinus rhythm.  Exam:  General: Appears comfortable at rest.  Lungs: Crackles at bases. Nonlabored breathing.  Cardiac: Elevated JVP, RRR with 2/6 systolic murmur at the base.  Abdomen: NABS.  Extremities: 1-2+ edema.   Lab Results:  Basic Metabolic Panel:  Recent Labs Lab 08/29/13 0235 08/30/13 0235  08/31/13 1042 09/01/13 0358 09/02/13 0400  NA 128* 132*  < > 136* 137 134*  K 3.1* 3.6*  < > 3.6* 3.1* 3.2*  CL 87* 92*  < > 94* 94* 92*  CO2 25 26  < > 31 29 30   GLUCOSE 93 91  < > 89 94 92  BUN 36* 35*  < > 25* 27* 27*  CREATININE 1.30* 1.13*  < > 0.94 0.97 0.90  CALCIUM 8.2* 8.3*  < > 8.3* 8.6 8.6  MG  --  2.2  --   --   --  1.8  < > = values in this interval not displayed.  CBC:  Recent Labs Lab 08/31/13 1339 09/01/13 0857 09/02/13 0400  WBC 4.2 5.4 4.6  HGB 7.8* 7.4* 6.6*  HCT 24.9* 24.5* 21.6*  MCV 96.9 95.7 96.9  PLT 107* 103* 86*    Transesophageal echocardiogram 08/30/2013: Study Conclusions  - Left ventricle: The cavity size was normal. Wall thickness was normal. Systolic function was  normal. The estimated ejection fraction was in the range of 60% to 65%. The interventricular septum was D-shaped, suggesting RV pressure/volume overload. Wall motion was normal; there were no regional wall motion abnormalities. - Aortic valve: There was a bioprosthetic aortic valve. This valve opened well but was small. Mean gradient across the valve was 20 mmHg, suggesting mild-moderate stenosis, likely due to a degree of patient-prosthesis mismatch. There was no regurgitation. - Aorta: Normal caliber thoracic aorta with mild plaque. - Mitral valve: The mitral valve annulus was severely calcified and the posterior mitral leaflet was heavily calcified and restricted. The anterior leaflet moved relatively normally. Mean gradient was 10 mmHg but MVA was > 2 by PHT. I suspect that mitral stenosis is probably mild, with elevated mean gradient perhaps more due to high flow. There was mild to moderate MR. - Left atrium: The atrium was moderately dilated. No evidence of thrombus in the atrial cavity or appendage. - Right ventricle: The cavity size was mildly dilated. Systolic function was normal. - Atrial septum: No defect or patent foramen ovale was identified. - Tricuspid valve: There was moderate regurgitation. RV-RA gradient 25 mmHg.  Medications:   Scheduled Medications: . gabapentin  300 mg Oral BID  . levothyroxine  200 mcg Oral QAC breakfast  . metolazone  2.5 mg Oral Daily  . potassium chloride  40 mEq Oral TID  . sodium chloride  3 mL Intravenous Q12H  . sorbitol  15 mL Oral Daily    Infusions: . furosemide (LASIX) infusion 10 mg/hr (09/02/13 0110)    PRN Medications: acetaminophen, ondansetron (ZOFRAN) IV, ondansetron   Assessment:   1. Acute on chronic diastolic heart failure, currently on Lasix drip with metolazone. Continuing to diurese well.   2. 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  3. Overall mild mitral stenosis by recent TEE.  4. 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  5. Acute blood loss anemia, suspected diverticular bleed. Hemoglobin 7.4 to 6.6.  6. Hypokalemia.   Plan/Discussion:    Will try and continue Lasix drip and metolazone. She is tolerating low normal to low blood pressures and volume status continues to improve with stable renal function. Consider packed red cell transfusion with further reduction in hemoglobin. Can likely offset this additional volume with current diuretic regimen.   Satira Sark, M.D., F.A.C.C.

## 2013-09-02 NOTE — Progress Notes (Signed)
Notified by lab that pt's Hgb is 6.6. MD on call paged and made aware. Pt asymptomatic; resting comfortably and stating that she feels "fine." No new orders received at this time. Will continue to monitor.

## 2013-09-03 DIAGNOSIS — D649 Anemia, unspecified: Secondary | ICD-10-CM

## 2013-09-03 LAB — COMPREHENSIVE METABOLIC PANEL
ALK PHOS: 86 U/L (ref 39–117)
ALT: 13 U/L (ref 0–35)
AST: 26 U/L (ref 0–37)
Albumin: 2.8 g/dL — ABNORMAL LOW (ref 3.5–5.2)
Anion gap: 14 (ref 5–15)
BUN: 30 mg/dL — ABNORMAL HIGH (ref 6–23)
CO2: 30 mEq/L (ref 19–32)
Calcium: 8.6 mg/dL (ref 8.4–10.5)
Chloride: 91 mEq/L — ABNORMAL LOW (ref 96–112)
Creatinine, Ser: 0.86 mg/dL (ref 0.50–1.10)
GFR calc Af Amer: 76 mL/min — ABNORMAL LOW (ref >90)
GFR calc non Af Amer: 65 mL/min — ABNORMAL LOW (ref >90)
Glucose, Bld: 94 mg/dL (ref 70–99)
POTASSIUM: 2.7 meq/L — AB (ref 3.7–5.3)
SODIUM: 135 meq/L — AB (ref 137–147)
Total Bilirubin: 0.7 mg/dL (ref 0.3–1.2)
Total Protein: 5.9 g/dL — ABNORMAL LOW (ref 6.0–8.3)

## 2013-09-03 LAB — RETICULOCYTES
RBC.: 2.29 MIL/uL — AB (ref 3.87–5.11)
Retic Count, Absolute: 109.9 10*3/uL (ref 19.0–186.0)
Retic Ct Pct: 4.8 % — ABNORMAL HIGH (ref 0.4–3.1)

## 2013-09-03 LAB — CBC
HCT: 22.1 % — ABNORMAL LOW (ref 36.0–46.0)
Hemoglobin: 6.8 g/dL — CL (ref 12.0–15.0)
MCH: 29.7 pg (ref 26.0–34.0)
MCHC: 30.8 g/dL (ref 30.0–36.0)
MCV: 96.5 fL (ref 78.0–100.0)
PLATELETS: 89 10*3/uL — AB (ref 150–400)
RBC: 2.29 MIL/uL — ABNORMAL LOW (ref 3.87–5.11)
RDW: 16.2 % — ABNORMAL HIGH (ref 11.5–15.5)
WBC: 4.2 10*3/uL (ref 4.0–10.5)

## 2013-09-03 LAB — BASIC METABOLIC PANEL
Anion gap: 16 — ABNORMAL HIGH (ref 5–15)
BUN: 28 mg/dL — AB (ref 6–23)
CHLORIDE: 90 meq/L — AB (ref 96–112)
CO2: 29 mEq/L (ref 19–32)
CREATININE: 0.88 mg/dL (ref 0.50–1.10)
Calcium: 8.4 mg/dL (ref 8.4–10.5)
GFR calc non Af Amer: 64 mL/min — ABNORMAL LOW (ref >90)
GFR, EST AFRICAN AMERICAN: 74 mL/min — AB (ref >90)
GLUCOSE: 114 mg/dL — AB (ref 70–99)
Potassium: 2.9 mEq/L — CL (ref 3.7–5.3)
Sodium: 135 mEq/L — ABNORMAL LOW (ref 137–147)

## 2013-09-03 LAB — LACTATE DEHYDROGENASE: LDH: 181 U/L (ref 94–250)

## 2013-09-03 LAB — MAGNESIUM: Magnesium: 2 mg/dL (ref 1.5–2.5)

## 2013-09-03 LAB — HAPTOGLOBIN: Haptoglobin: 157 mg/dL (ref 45–215)

## 2013-09-03 MED ORDER — SPIRONOLACTONE 25 MG PO TABS
25.0000 mg | ORAL_TABLET | Freq: Every day | ORAL | Status: DC
  Administered 2013-09-03: 25 mg via ORAL
  Filled 2013-09-03 (×2): qty 1

## 2013-09-03 MED ORDER — POTASSIUM CHLORIDE CRYS ER 20 MEQ PO TBCR
40.0000 meq | EXTENDED_RELEASE_TABLET | ORAL | Status: AC
  Administered 2013-09-03 – 2013-09-04 (×5): 40 meq via ORAL
  Filled 2013-09-03 (×2): qty 2

## 2013-09-03 MED ORDER — LEVOTHYROXINE SODIUM 200 MCG PO TABS
200.0000 ug | ORAL_TABLET | Freq: Every day | ORAL | Status: DC
  Administered 2013-09-03 – 2013-09-07 (×5): 200 ug via ORAL
  Filled 2013-09-03 (×6): qty 1

## 2013-09-03 MED ORDER — POTASSIUM CHLORIDE 10 MEQ/100ML IV SOLN
10.0000 meq | INTRAVENOUS | Status: DC
  Administered 2013-09-03: 10 meq via INTRAVENOUS
  Filled 2013-09-03 (×6): qty 100

## 2013-09-03 NOTE — Progress Notes (Signed)
PT. With critical potassium level this am of 2.7. Internal med paged. Oncoming RN made aware. Pt. Alert and stable. RN will continue to monitor for changes in condition. Nancyjo Givhan, Katherine Roan

## 2013-09-03 NOTE — Progress Notes (Signed)
    Progress Note   Subjective  feels okay.    Objective   Vital signs in last 24 hours: Temp:  [97.6 F (36.4 C)-97.8 F (36.6 C)] 97.6 F (36.4 C) (07/27 1454) Pulse Rate:  [71-96] 88 (07/27 1454) Resp:  [18] 18 (07/27 1454) BP: (94-110)/(43-71) 106/56 mmHg (07/27 1454) SpO2:  [91 %-97 %] 91 % (07/27 1454) Weight:  [180 lb 8 oz (81.874 kg)] 180 lb 8 oz (81.874 kg) (07/27 0558) Last BM Date: 09/01/13 General:    Pleasant white female in NAD Abdomen:  Soft, nontender and nondistended. Normal bowel sounds. Neurologic:  Alert and oriented,  grossly normal neurologically. Psych:  Cooperative. Normal mood and affect.   Lab Results:  Recent Labs  09/02/13 0400 09/02/13 1421 09/03/13 0640  WBC 4.6 5.4 4.2  HGB 6.6* 6.7* 6.8*  HCT 21.6* 21.5* 22.1*  PLT 86* 88* 89*   BMET  Recent Labs  09/02/13 0400 09/03/13 0640 09/03/13 1420  NA 134* 135* 135*  K 3.2* 2.7* 2.9*  CL 92* 91* 90*  CO2 30 30 29   GLUCOSE 92 94 114*  BUN 27* 30* 28*  CREATININE 0.90 0.86 0.88  CALCIUM 8.6 8.6 8.4   LFT  Recent Labs  09/03/13 0640  PROT 5.9*  ALBUMIN 2.8*  AST 26  ALT 13  ALKPHOS 86  BILITOT 0.7    Endoscopic workup ([place]) by Dr. Dorrene German Colonoscopy 06/29/13 reached 4 cm into TI.  2 diminutive transverse polyps, removed  Moderate sigmoid diverticulosis.  Int and ext hemorrhoids  Pathology: fragments of tubular adenomas  EGD 06/29/13  Mild, patchy antral erythema.  Freckled looking, circumferential, tiny red spots in duodenum. No discrete AVMs.  "Bumpy" tissue in duodenal bulb.  Pathology: chronic gastritis, no H Pylori. SB bx: no features of sprue, no organisms of Whipples disease or Giardia. No SB pathologic changes    Assessment / Plan:    31. 73 year old female who we saw earlier this admission for rectal bleeding felt to be hemorrhoidal vrs diverticular. Rectal bleeding has resolved.   2. Acute on chronic anemia secondary to #1. Patient was worked up in  [employer] for anemia a few months back. EGD / colonoscopy were unrevealing. Her recent drop in hgb is likely secondary to #1. She is s/p three units of blood this admission. Hgb settled in mid 7 range after blood but has drifted slightly to 6.8.  Stools have remained black (off iron since admission 6 day ago). If EGD in May was unrevealing doubt repeat EGD would offer much. If heme positive will consider endo capsule study    LOS: 6 days   Tye Savoy  09/03/2013, 3:54 PM     Attending physician's note   I have taken an interval history, reviewed the chart and examined the patient. I agree with the Advanced Practitioner's note, impression and recommendations. Would consider PRBC transfusion to keep Hb > 8, defer decision to Cardiology and primary service. Follow up with Dr. Reather Laurence as outpatient to further evaluate possible cirrhosis and to consider CE to further evaluate her anemia. No additional GI evaluation needed at this time. GI signing off.   Pricilla Riffle. Fuller Plan, MD [facility]

## 2013-09-03 NOTE — Progress Notes (Signed)
MD aware of K+ 2.7. New orders given.

## 2013-09-03 NOTE — Progress Notes (Signed)
Dear Doctor: This patient has been identified as a candidate for PICC for the following reason (s): poor veins/poor circulatory system (CHF, COPD, emphysema, diabetes, steroid use, IV drug abuse, etc.) If you agree, please write an order for the indicated device. For any questions contact the Vascular Access Team at [phone] if no answer, please leave a message.  Thank you for supporting the early vascular access assessment program. Ercilia Bettinger M  

## 2013-09-03 NOTE — Progress Notes (Signed)
CARDIAC REHAB PHASE I   PRE:  Rate/Rhythm: 92  BP:  Supine:   Sitting: 110/50  Standing:    SaO2: 97 RA  MODE:  Ambulation: 430 ft   POST:  Rate/Rhythm: 98  BP:  Supine:   Sitting: 120/50  Standing:    SaO2: 98 RA 0900-0945 On arrival pt's IV had come lose. RN in with pt, helped to clean pt up. Assisted X 1 and used  Walker to ambulate. Pt able to walk 430 feet without c/o of pain or SOB. VS stable Pt to bed after walk to have second IV started. Pt states that it felt good to walk. Rodney Langton RN 09/03/2013 9:40 AM

## 2013-09-03 NOTE — Progress Notes (Signed)
Subjective: Pt denies SOB and fatigue. She did not have any bowel movements yesterday.   Objective: Vital signs in last 24 hours: Filed Vitals:   09/03/13 0815 09/03/13 0819 09/03/13 0820 09/03/13 1043  BP: 98/46 104/46 101/46 107/43  Pulse: 78 90 96 91  Temp: 97.6 F (36.4 C) 97.6 F (36.4 C) 97.6 F (36.4 C)   TempSrc: Oral Oral Oral   Resp: 18 18 18    Height:      Weight:      SpO2: 95% 95% 97%    Weight change: 13.2 oz (0.374 kg)  Intake/Output Summary (Last 24 hours) at 09/03/13 1301 Last data filed at 09/03/13 1157  Gross per 24 hour  Intake    923 ml  Output   2901 ml  Net  -1978 ml   Vitals reviewed. General: sitting in chair, NAD HEENT: EOMI Cardiac: RRR, systolic mumur at lt sternal border Pulm: clear to auscultation b/l  Abd: soft, nontender, nondistended, BS present Ext:  pitting edema +2 up to upper thighs Neuro: alert and oriented X3, strength and sensation grossly intact  Lab Results: Basic Metabolic Panel:  Recent Labs Lab 09/02/13 0400 09/03/13 0640  NA 134* 135*  K 3.2* 2.7*  CL 92* 91*  CO2 30 30  GLUCOSE 92 94  BUN 27* 30*  CREATININE 0.90 0.86  CALCIUM 8.6 8.6  MG 1.8 2.0   Liver Function Tests:  Recent Labs Lab 08/28/13 1330 09/03/13 0640  AST 27 26  ALT 14 13  ALKPHOS 104 86  BILITOT 0.8 0.7  PROT 6.6 5.9*  ALBUMIN 3.2* 2.8*   CBC:  Recent Labs Lab 08/30/13 1340  09/02/13 1421 09/03/13 0640  WBC 4.5  < > 5.4 4.2  NEUTROABS 2.6  --   --   --   HGB 8.2*  < > 6.7* 6.8*  HCT 26.4*  < > 21.5* 22.1*  MCV 93.0  < > 96.0 96.5  PLT 129*  < > 88* 89*  < > = values in this interval not displayed. BNP:  Recent Labs Lab 08/28/13 1330  PROBNP 2110.0*   Medications: I have reviewed the patient's current medications. Scheduled Meds: . gabapentin  300 mg Oral BID  . levothyroxine  200 mcg Oral QAC breakfast  . metolazone  2.5 mg Oral Daily  . potassium chloride  40 mEq Oral Q4H  . sodium chloride  3 mL Intravenous  Q12H  . sorbitol  15 mL Oral Daily  . spironolactone  25 mg Oral Daily   Continuous Infusions: . furosemide (LASIX) infusion 15 mg/hr (09/03/13 0917)   PRN Meds:.acetaminophen, ondansetron (ZOFRAN) IV, ondansetron Assessment/Plan:  Acute on chronic diastolic HF with CKD 2: diuresing well on lasix gtt and metolazone.  ECHO on 7/23 reveals EF 60%, mild/moderate AS, MR, and mitral stenosis. Renal function remains stable with slight bump on BUN to 27 but Cr still <1. GFR improved to 62 today.  - appreciate HF team following, increasing lasix gtt to 15/hr and continuing metolazone - will continue to monitor weights and get I/Os, weight on prior d/c was not her dry weight which is less than 173. Pt currently down to 180lbs and net neg 16.5L since admission - trend bmet, and adding spironolactone 25mg  daily to help w/ hypokalemia 2/2 diuresis. Will increase spironolactone to 25mg  BID tomorrow - continue to ambulate patient  BRBPR-- diverticular vs hemorrhoidal bleed that has apparently resolved but Hb down again today. Hgb 7.4 yesterday to 6.6 this morning.  Asymptomatic and not orthostatic. Has received total of 3 units PRBC this admission for Hb down to 5.5 on admission.  - type and screened  - hgb 6.8 today, will hold on transfusing at this time given no symptoms - LDH 181 and  haptoglobin 157 both WNL, likely hemolysis 2/2 to prosthetic aortic valve is not cause of low hgb -GI will see patient today to evaluate for decreasing hgb  Hypokalemia--K 2.7 today.  - received one run of KCl in IVFs, d/c'd to prevent addition of further volume, increased KDur 18mEq to QID. - added spironolactone 25mg  once daily and will increase to BID tomorrow.  -repeat BMET today  Thrombocytopenia--Platelets continue to trend down to 86 today from 154 on admission. Have been low since June 2015 in our records <150 usually. - not on heparin - will continue to monitor for now  DVT: SCD's Diet: HH Dispo:  Disposition is deferred at this time, awaiting improvement of current medical problems.  Anticipated discharge in approximately 1-2 day(s).   The patient does have a current PCP Rubie Maid, MD) and does need an [facility] hospital follow-up appointment after discharge.  The patient does not have transportation limitations that hinder transportation to clinic appointments.  Services Needed at time of discharge: Y = Yes, Blank = No PT:   OT:   RN:   Equipment:   Other:     LOS: 6 days   Julious Oka, MD 09/03/2013, 1:01 PM

## 2013-09-03 NOTE — Progress Notes (Signed)
Internal Medicine Attending  Date: 09/03/2013  Patient name: Nicole Hansen record number: [id number] Date of birth: 09/12/1940 Age: 73 y.o. Gender: female  I saw and evaluated the patient. I developed the assessment and plan with the housestaff and reviewed the resident's note by Dr. Hulen Luster.  I agree with the resident's findings and plans as documented in her progress note.  Nicole Hansen denied any dyspnea while ambulating the halls this morning with nursing.  She was observed ambulating with a walker without difficulty.  She continues to diurese and we appreciate Cardiology's recommendations for adjustments to her regimen.  GI still feels endoscopy at this time would not be appropriate.  As she remains asymptomatic from an anemia standpoint and is fluid overloaded we will not transfuse at this time.  If she develops a symptomatic anemia, we will transfuse as that would be the time when benefits may outweigh the risks of the volume load associated with a transfusion.

## 2013-09-03 NOTE — Progress Notes (Signed)
Primary cardiologist: Dr. Wyline Copas (also followed by CHF team)  Subjective:    Denies dyspnea. Still edematous.  6 runs of IV Kcl ordered for K 2.7. Hgb remains 6.6-6.7. No transfusion ordered. Denies active bleeding. I/o -1.3L Weight up 1 pound overnight.    Objective:   Temp:  [97.6 F (36.4 C)-98.5 F (36.9 C)] 97.6 F (36.4 C) (07/27 0820) Pulse Rate:  [71-96] 96 (07/27 0820) Resp:  [18] 18 (07/27 0820) BP: (94-110)/(44-71) 101/46 mmHg (07/27 0820) SpO2:  [93 %-100 %] 97 % (07/27 0820) Weight:  [81.874 kg (180 lb 8 oz)] 81.874 kg (180 lb 8 oz) (07/27 0558) Last BM Date: 09/01/13  Filed Weights   09/01/13 0514 09/02/13 0450 09/03/13 0558  Weight: 82.056 kg (180 lb 14.4 oz) 81.5 kg (179 lb 10.8 oz) 81.874 kg (180 lb 8 oz)    Intake/Output Summary (Last 24 hours) at 09/03/13 0912 Last data filed at 09/03/13 0815  Gross per 24 hour  Intake 1323.33 ml  Output   3126 ml  Net -1802.67 ml    Telemetry: Sinus rhythm.  Exam:  General: Appears comfortable at rest. Pale.   Lungs: Crackles at bases. Nonlabored breathing.  Cardiac: Elevated JVP to jaw, RRR with 2/6 systolic murmur at the RSB  Abdomen: NABS. Nondistended. NT  Extremities: 2+ edema into thigh   Lab Results:  Basic Metabolic Panel:  Recent Labs Lab 08/30/13 0235  09/01/13 0358 09/02/13 0400 09/03/13 0640  NA 132*  < > 137 134* 135*  K 3.6*  < > 3.1* 3.2* 2.7*  CL 92*  < > 94* 92* 91*  CO2 26  < > 29 30 30   GLUCOSE 91  < > 94 92 94  BUN 35*  < > 27* 27* 30*  CREATININE 1.13*  < > 0.97 0.90 0.86  CALCIUM 8.3*  < > 8.6 8.6 8.6  MG 2.2  --   --  1.8 2.0  < > = values in this interval not displayed.  CBC:  Recent Labs Lab 09/02/13 0400 09/02/13 1421 09/03/13 0640  WBC 4.6 5.4 4.2  HGB 6.6* 6.7* 6.8*  HCT 21.6* 21.5* 22.1*  MCV 96.9 96.0 96.5  PLT 86* 88* 89*    Transesophageal echocardiogram 08/30/2013: Study Conclusions  - Left ventricle: The cavity size was normal. Wall thickness  was normal. Systolic function was normal. The estimated ejection fraction was in the range of 60% to 65%. The interventricular septum was D-shaped, suggesting RV pressure/volume overload. Wall motion was normal; there were no regional wall motion abnormalities. - Aortic valve: There was a bioprosthetic aortic valve. This valve opened well but was small. Mean gradient across the valve was 20 mmHg, suggesting mild-moderate stenosis, likely due to a degree of patient-prosthesis mismatch. There was no regurgitation. - Aorta: Normal caliber thoracic aorta with mild plaque. - Mitral valve: The mitral valve annulus was severely calcified and the posterior mitral leaflet was heavily calcified and restricted. The anterior leaflet moved relatively normally. Mean gradient was 10 mmHg but MVA was > 2 by PHT. I suspect that mitral stenosis is probably mild, with elevated mean gradient perhaps more due to high flow. There was mild to moderate MR. - Left atrium: The atrium was moderately dilated. No evidence of thrombus in the atrial cavity or appendage. - Right ventricle: The cavity size was mildly dilated. Systolic function was normal. - Atrial septum: No defect or patent foramen ovale was identified. - Tricuspid valve: There was moderate regurgitation. RV-RA gradient  25 mmHg.    Medications:   Scheduled Medications: . gabapentin  300 mg Oral BID  . levothyroxine  200 mcg Oral QAC breakfast  . metolazone  2.5 mg Oral Daily  . potassium chloride  10 mEq Intravenous Q1 Hr x 6  . potassium chloride  40 mEq Oral TID  . sodium chloride  3 mL Intravenous Q12H  . sorbitol  15 mL Oral Daily  . spironolactone  25 mg Oral Daily    Infusions: . furosemide (LASIX) infusion 10 mg/hr (09/02/13 2315)    PRN Medications: acetaminophen, ondansetron (ZOFRAN) IV, ondansetron   Assessment:   1. Acute on chronic diastolic heart failure, currently on Lasix drip with metolazone. Continuing to diurese  well.   2. 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  3. Overall mild mitral stenosis by recent TEE.  4. 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  5. Acute blood loss anemia, suspected diverticular bleed. Hemoglobin 7.4 to 6.6.  6. Hypokalemia.   Plan/Discussion:    She still has marked volume overload and is responding sluggishly. Will increase lasix to 15/hr - Will likely need more but will go gently due to hypokalemia. Continue metolazone. Add spiro to help with diuresis and K sparing. Will start 25 daily and increase to 25 bid tomorrow. Place TED hose. As she is not having arrhythmias would prefer to spare her the additional volume. Would supp K orally. Will d/w primary team.   Hgb 6.7. Strongly recommend transfusion and consider having GI see.   Daniel Bensimhon,MD 9:17 AM

## 2013-09-03 NOTE — Progress Notes (Signed)
CRITICAL VALUE ALERT  Critical value received:  K+ 2.9  Date of notification:  09/03/13  Time of notification:  7588  Critical value read back:yes  Nurse who received alert:  Chester Holstein RN  MD notified (1st page):  Hulen Luster MD  Time of first page:  65  MD notified (2nd page):  Time of second page:  Responding MD:  Hulen Luster  Time MD responded:  [phone]  Continue to supplement with PO potassium.

## 2013-09-04 DIAGNOSIS — K5731 Diverticulosis of large intestine without perforation or abscess with bleeding: Principal | ICD-10-CM

## 2013-09-04 LAB — BASIC METABOLIC PANEL
Anion gap: 13 (ref 5–15)
BUN: 32 mg/dL — AB (ref 6–23)
CHLORIDE: 91 meq/L — AB (ref 96–112)
CO2: 29 mEq/L (ref 19–32)
Calcium: 8.4 mg/dL (ref 8.4–10.5)
Creatinine, Ser: 0.98 mg/dL (ref 0.50–1.10)
GFR calc non Af Amer: 56 mL/min — ABNORMAL LOW (ref >90)
GFR, EST AFRICAN AMERICAN: 65 mL/min — AB (ref >90)
GLUCOSE: 93 mg/dL (ref 70–99)
Potassium: 3.6 mEq/L — ABNORMAL LOW (ref 3.7–5.3)
Sodium: 133 mEq/L — ABNORMAL LOW (ref 137–147)

## 2013-09-04 LAB — CBC
HCT: 20.1 % — ABNORMAL LOW (ref 36.0–46.0)
HCT: 23.4 % — ABNORMAL LOW (ref 36.0–46.0)
HEMOGLOBIN: 6.2 g/dL — AB (ref 12.0–15.0)
HEMOGLOBIN: 7.4 g/dL — AB (ref 12.0–15.0)
MCH: 29.5 pg (ref 26.0–34.0)
MCH: 29.5 pg (ref 26.0–34.0)
MCHC: 30.8 g/dL (ref 30.0–36.0)
MCHC: 31.6 g/dL (ref 30.0–36.0)
MCV: 95.7 fL (ref 78.0–100.0)
MCV: 93.2 fL (ref 78.0–100.0)
PLATELETS: 116 10*3/uL — AB (ref 150–400)
Platelets: 94 10*3/uL — ABNORMAL LOW (ref 150–400)
RBC: 2.1 MIL/uL — ABNORMAL LOW (ref 3.87–5.11)
RBC: 2.51 MIL/uL — AB (ref 3.87–5.11)
RDW: 16 % — ABNORMAL HIGH (ref 11.5–15.5)
RDW: 16 % — ABNORMAL HIGH (ref 11.5–15.5)
WBC: 4.6 10*3/uL (ref 4.0–10.5)
WBC: 6 10*3/uL (ref 4.0–10.5)

## 2013-09-04 LAB — PREPARE RBC (CROSSMATCH)

## 2013-09-04 MED ORDER — FUROSEMIDE 10 MG/ML IJ SOLN
20.0000 mg/h | INTRAMUSCULAR | Status: DC
  Administered 2013-09-04 – 2013-09-06 (×5): 20 mg/h via INTRAVENOUS
  Filled 2013-09-04 (×13): qty 25

## 2013-09-04 MED ORDER — POTASSIUM CHLORIDE CRYS ER 20 MEQ PO TBCR
40.0000 meq | EXTENDED_RELEASE_TABLET | Freq: Every day | ORAL | Status: DC
  Administered 2013-09-04: 40 meq via ORAL
  Filled 2013-09-04 (×2): qty 2

## 2013-09-04 MED ORDER — SPIRONOLACTONE 25 MG PO TABS
25.0000 mg | ORAL_TABLET | Freq: Two times a day (BID) | ORAL | Status: DC
  Administered 2013-09-04 – 2013-09-07 (×7): 25 mg via ORAL
  Filled 2013-09-04 (×9): qty 1

## 2013-09-04 MED ORDER — CYCLOBENZAPRINE HCL 10 MG PO TABS
5.0000 mg | ORAL_TABLET | Freq: Every day | ORAL | Status: DC | PRN
  Administered 2013-09-04 – 2013-09-06 (×3): 5 mg via ORAL
  Filled 2013-09-04: qty 1
  Filled 2013-09-04: qty 0.5
  Filled 2013-09-04: qty 1

## 2013-09-04 MED ORDER — METOLAZONE 5 MG PO TABS
5.0000 mg | ORAL_TABLET | Freq: Every day | ORAL | Status: DC
  Administered 2013-09-04 – 2013-09-07 (×4): 5 mg via ORAL
  Filled 2013-09-04 (×4): qty 1

## 2013-09-04 NOTE — Progress Notes (Signed)
CARDIAC REHAB PHASE I   PRE:  Rate/Rhythm: not on telemetry 88 per pulse ox  BP:  Supine:   Sitting: 104/58  Standing:    SaO2: 100%RA  MODE:  Ambulation: 460 ft   POST:  Rate/Rhythm: 91 per pulse ox   BP:  Supine:   Sitting: 115/51  Standing:    SaO2: 94%RA 0955-1035 Pt walked 460 ft on RA with rolling walker and minimal asst. Tolerated without dizziness or SOB. Asked pt's RN about her not being on telemetry with low potassium yesterday and lasix drip and low HGB. RN stated would check into it.  To recliner after walk with call bell.   Graylon Good, RN BSN  09/04/2013 10:30 AM

## 2013-09-04 NOTE — Progress Notes (Addendum)
Subjective: Pt complaining of severe muscle cramping and body aches last night.   Objective: Vital signs in last 24 hours: Filed Vitals:   09/04/13 1036 09/04/13 1200 09/04/13 1215 09/04/13 1246  BP: 106/47 97/37 102/48 108/53  Pulse: 86 88  84  Temp:  98.1 F (36.7 C)  97.8 F (36.6 C)  TempSrc:  Oral  Oral  Resp:  20  18  Height:      Weight:      SpO2:  98%  100%   Weight change: 1 lb 8 oz (0.68 kg)  Intake/Output Summary (Last 24 hours) at 09/04/13 1313 Last data filed at 09/04/13 1113  Gross per 24 hour  Intake   1080 ml  Output   2601 ml  Net  -1521 ml   Vitals reviewed. General: NAD HEENT: EOMI Cardiac: RRR, systolic mumur at lt sternal border Pulm: clear to auscultation b/l  Abd: soft, nontender, nondistended, BS present Ext:  Pitting pedal edema +2  Neuro: alert and oriented X3, CN II-XII grossly intact   Lab Results: Basic Metabolic Panel:  Recent Labs Lab 09/02/13 0400 09/03/13 0640 09/03/13 1420 09/04/13 0522  NA 134* 135* 135* 133*  K 3.2* 2.7* 2.9* 3.6*  CL 92* 91* 90* 91*  CO2 30 30 29 29   GLUCOSE 92 94 114* 93  BUN 27* 30* 28* 32*  CREATININE 0.90 0.86 0.88 0.98  CALCIUM 8.6 8.6 8.4 8.4  MG 1.8 2.0  --   --    Liver Function Tests:  Recent Labs Lab 08/28/13 1330 09/03/13 0640  AST 27 26  ALT 14 13  ALKPHOS 104 86  BILITOT 0.8 0.7  PROT 6.6 5.9*  ALBUMIN 3.2* 2.8*   CBC:  Recent Labs Lab 08/30/13 1340  09/03/13 0640 09/04/13 0522  WBC 4.5  < > 4.2 4.6  NEUTROABS 2.6  --   --   --   HGB 8.2*  < > 6.8* 6.2*  HCT 26.4*  < > 22.1* 20.1*  MCV 93.0  < > 96.5 95.7  PLT 129*  < > 89* 94*  < > = values in this interval not displayed. BNP:  Recent Labs Lab 08/28/13 1330  PROBNP 2110.0*   Medications: I have reviewed the patient's current medications. Scheduled Meds: . gabapentin  300 mg Oral BID  . levothyroxine  200 mcg Oral QAC breakfast  . metolazone  5 mg Oral Daily  . sodium chloride  3 mL Intravenous Q12H    . sorbitol  15 mL Oral Daily  . spironolactone  25 mg Oral BID   Continuous Infusions: . furosemide (LASIX) infusion 20 mg/hr (09/04/13 1133)   PRN Meds:.acetaminophen, cyclobenzaprine, ondansetron (ZOFRAN) IV, ondansetron Assessment/Plan:  Acute on chronic diastolic HF with CKD 2:  ECHO on 7/23 reveals EF 60%, mild/moderate AS, MR, and mitral stenosis.  - Pt is 1lb up from 180lbs yesterdayt, and Cr increased to 0.98 from 0.88 yesterday.  -increase spironolactone to 25mg  BID - appreciate HF team following, increasing lasix gtt to 20/hr and increasing metolazone to 5mg    BRBPR-- diverticular vs hemorrhoidal bleed that has apparently resolved but Hb down again today at 6.2 from 6.8 yesterday.  Asymptomatic and not orthostatic. Has received total of 3 units PRBC this admission for Hb down to 5.5 on admission. Pt given 1U RBC today and will repeat CBC later - GI saw patient and recommend outpatient lower capsule endoscopy and have scheduled patient to see Dr. Dorrene German from GI on 8/14 at 2pm -  will try to minimize blood draws as that can be contributing to low hgb as well  Hypokalemia--K 3.6 today.  -decrease KDur 34mEq to once a day - spironolactone 25mg  BID   Thrombocytopenia- Have been low since June 2015 in our records <150 usually. - not on heparin, trending up to 94 from 89 yesterday - will continue to monitor   DVT: SCD's Diet: HH Dispo: Disposition is deferred at this time, awaiting improvement of current medical problems.  Anticipated discharge in approximately 1-2 day(s).   The patient does have a current PCP Rubie Maid, MD) and does need an [facility] hospital follow-up appointment after discharge.  The patient does not have transportation limitations that hinder transportation to clinic appointments.  Services Needed at time of discharge: Y = Yes, Blank = No PT:   OT:   RN:   Equipment:   Other:     LOS: 7 days   Julious Oka, MD 09/04/2013, 1:13 PM

## 2013-09-04 NOTE — Progress Notes (Signed)
Pt is resting in bed. No signs and symptoms of distress. Report given to oncoming RN.

## 2013-09-04 NOTE — Progress Notes (Signed)
Internal Medicine Attending  Date: 09/04/2013  Patient name: Nicole Hansen record number: [id number] Date of birth: 01-Mar-1940 Age: 73 y.o. Gender: female  I saw and evaluated the patient. I developed the assessment and plan with the housestaff and reviewed the resident's note by Dr. Hulen Hansen.  I agree with the resident's findings and plans as documented in her progress note.  [name] notes significant muscle cramping last night. Her diuresis has slowed. Nonetheless, she has been able to ambulate with a walker without dizziness or shortness of breath. Her hemoglobin continues to fall and is at a point that requires transfusion despite her being asymptomatic and volume overload. We will therefore transfuse with 1 unit of packed red blood cells in order to avoid excessive volume. Cardiology has made adjustments in her diuretic regimen and we will reassess her response in the morning.

## 2013-09-04 NOTE — Progress Notes (Addendum)
Primary cardiologist: Dr. Wyline Copas (also followed by CHF team)  Subjective:    Denies dyspnea. Severe cramps last night. Still edematous. K supped aggressively yesterday. I/O -1.5 L but weight up 1 pound. Lasix gtt increased to 15/hr yesterday. Denies active bleeding but Hgb now down to 6.2.     Objective:   Temp:  [97.6 F (36.4 C)-98.1 F (36.7 C)] 98.1 F (36.7 C) (07/28 0514) Pulse Rate:  [80-91] 80 (07/28 0514) Resp:  [18-20] 20 (07/28 0514) BP: (106-107)/(43-56) 106/56 mmHg (07/27 1454) SpO2:  [91 %-94 %] 94 % (07/28 0514) Weight:  [82.555 kg (182 lb)] 82.555 kg (182 lb) (07/28 0514) Last BM Date: 09/01/13  Filed Weights   09/02/13 0450 09/03/13 0558 09/04/13 0514  Weight: 81.5 kg (179 lb 10.8 oz) 81.874 kg (180 lb 8 oz) 82.555 kg (182 lb)    Intake/Output Summary (Last 24 hours) at 09/04/13 0939 Last data filed at 09/04/13 0600  Gross per 24 hour  Intake   1080 ml  Output   2301 ml  Net  -1221 ml    Telemetry: Sinus rhythm.  Exam:  General: Appears comfortable at rest. Pale.   Lungs: Crackles at bases. Nonlabored breathing.  Cardiac: Elevated JVP to jaw, RRR with 2/6 systolic murmur at the RSB  Abdomen: NABS. Nondistended. NT  Extremities: 2-3+ edema into thigh   Lab Results:  Basic Metabolic Panel:  Recent Labs Lab 08/30/13 0235  09/02/13 0400 09/03/13 0640 09/03/13 1420 09/04/13 0522  NA 132*  < > 134* 135* 135* 133*  K 3.6*  < > 3.2* 2.7* 2.9* 3.6*  CL 92*  < > 92* 91* 90* 91*  CO2 26  < > 30 30 29 29   GLUCOSE 91  < > 92 94 114* 93  BUN 35*  < > 27* 30* 28* 32*  CREATININE 1.13*  < > 0.90 0.86 0.88 0.98  CALCIUM 8.3*  < > 8.6 8.6 8.4 8.4  MG 2.2  --  1.8 2.0  --   --   < > = values in this interval not displayed.  CBC:  Recent Labs Lab 09/02/13 1421 09/03/13 0640 09/04/13 0522  WBC 5.4 4.2 4.6  HGB 6.7* 6.8* 6.2*  HCT 21.5* 22.1* 20.1*  MCV 96.0 96.5 95.7  PLT 88* 89* 94*    Transesophageal echocardiogram 08/30/2013: Study  Conclusions  - Left ventricle: The cavity size was normal. Wall thickness was normal. Systolic function was normal. The estimated ejection fraction was in the range of 60% to 65%. The interventricular septum was D-shaped, suggesting RV pressure/volume overload. Wall motion was normal; there were no regional wall motion abnormalities. - Aortic valve: There was a bioprosthetic aortic valve. This valve opened well but was small. Mean gradient across the valve was 20 mmHg, suggesting mild-moderate stenosis, likely due to a degree of patient-prosthesis mismatch. There was no regurgitation. - Aorta: Normal caliber thoracic aorta with mild plaque. - Mitral valve: The mitral valve annulus was severely calcified and the posterior mitral leaflet was heavily calcified and restricted. The anterior leaflet moved relatively normally. Mean gradient was 10 mmHg but MVA was > 2 by PHT. I suspect that mitral stenosis is probably mild, with elevated mean gradient perhaps more due to high flow. There was mild to moderate MR. - Left atrium: The atrium was moderately dilated. No evidence of thrombus in the atrial cavity or appendage. - Right ventricle: The cavity size was mildly dilated. Systolic function was normal. - Atrial septum: No  defect or patent foramen ovale was identified. - Tricuspid valve: There was moderate regurgitation. RV-RA gradient 25 mmHg.    Medications:   Scheduled Medications: . gabapentin  300 mg Oral BID  . levothyroxine  200 mcg Oral QAC breakfast  . metolazone  2.5 mg Oral Daily  . sodium chloride  3 mL Intravenous Q12H  . sorbitol  15 mL Oral Daily  . spironolactone  25 mg Oral Daily    Infusions: . furosemide (LASIX) infusion 15 mg/hr (09/03/13 2303)    PRN Medications: acetaminophen, cyclobenzaprine, ondansetron (ZOFRAN) IV, ondansetron   Assessment:   1. Acute on chronic diastolic heart failure,  2. Bioprosthetic AVR with evidence of mild to moderate  stenosis based on patient-prosthetic mismatch, mean gradient 20 mm mercury by TEE.  3. Overall mild mitral stenosis by recent TEE.  4. CKD stage II, creatinine  5. Acute blood loss anemia, suspected diverticular bleed.   6. Hypokalemia/hyponatremia.   Plan/Discussion:    She still has marked volume overload and is responding sluggishly. Will increase lasix to 20/hr and increase metolazone to 5mg  daily. Increase spiro to 25 bid.  Hgb now 6.2. Please consider transfusion today. GI note reviewed.   Nicole Hansen 9:39 AM

## 2013-09-04 NOTE — Progress Notes (Cosign Needed)
Arranged appt with GI Dr Dorrene German for 8/14 at 2 pm.  Please fax the discharge summary to him at [phone].  If not included in the discharge note, please fax cc of the chest CT scan as well.     Thank you Azucena Freed

## 2013-09-05 DIAGNOSIS — D500 Iron deficiency anemia secondary to blood loss (chronic): Secondary | ICD-10-CM

## 2013-09-05 DIAGNOSIS — K5900 Constipation, unspecified: Secondary | ICD-10-CM

## 2013-09-05 LAB — CBC
HEMATOCRIT: 21.5 % — AB (ref 36.0–46.0)
Hemoglobin: 6.8 g/dL — CL (ref 12.0–15.0)
MCH: 30 pg (ref 26.0–34.0)
MCHC: 31.6 g/dL (ref 30.0–36.0)
MCV: 94.7 fL (ref 78.0–100.0)
Platelets: 112 10*3/uL — ABNORMAL LOW (ref 150–400)
RBC: 2.27 MIL/uL — AB (ref 3.87–5.11)
RDW: 16 % — AB (ref 11.5–15.5)
WBC: 4.9 10*3/uL (ref 4.0–10.5)

## 2013-09-05 LAB — BASIC METABOLIC PANEL
Anion gap: 16 — ABNORMAL HIGH (ref 5–15)
BUN: 34 mg/dL — ABNORMAL HIGH (ref 6–23)
CALCIUM: 8.9 mg/dL (ref 8.4–10.5)
CO2: 30 mEq/L (ref 19–32)
CREATININE: 0.95 mg/dL (ref 0.50–1.10)
Chloride: 88 mEq/L — ABNORMAL LOW (ref 96–112)
GFR calc Af Amer: 67 mL/min — ABNORMAL LOW (ref >90)
GFR, EST NON AFRICAN AMERICAN: 58 mL/min — AB (ref >90)
GLUCOSE: 87 mg/dL (ref 70–99)
POTASSIUM: 2.7 meq/L — AB (ref 3.7–5.3)
Sodium: 134 mEq/L — ABNORMAL LOW (ref 137–147)

## 2013-09-05 LAB — PREPARE RBC (CROSSMATCH)

## 2013-09-05 LAB — HEMOGLOBIN AND HEMATOCRIT, BLOOD
HCT: 24.4 % — ABNORMAL LOW (ref 36.0–46.0)
Hemoglobin: 7.9 g/dL — ABNORMAL LOW (ref 12.0–15.0)

## 2013-09-05 MED ORDER — POTASSIUM CHLORIDE CRYS ER 20 MEQ PO TBCR
40.0000 meq | EXTENDED_RELEASE_TABLET | Freq: Two times a day (BID) | ORAL | Status: DC
  Administered 2013-09-06 – 2013-09-07 (×3): 40 meq via ORAL
  Filled 2013-09-05 (×4): qty 2

## 2013-09-05 MED ORDER — POTASSIUM CHLORIDE CRYS ER 20 MEQ PO TBCR
40.0000 meq | EXTENDED_RELEASE_TABLET | ORAL | Status: AC
  Administered 2013-09-05 – 2013-09-06 (×5): 40 meq via ORAL
  Filled 2013-09-05 (×4): qty 2

## 2013-09-05 MED ORDER — SORBITOL 70 % SOLN
15.0000 mL | Freq: Two times a day (BID) | Status: DC
  Administered 2013-09-05 – 2013-09-07 (×4): 15 mL via ORAL
  Filled 2013-09-05 (×5): qty 30

## 2013-09-05 NOTE — Progress Notes (Signed)
CRITICAL VALUE ALERT  Critical value received:  K+ 2.7  Date of notification:  09/05/2013  Time of notification:  0656  Critical value read back: Yes   Nurse who received alert:  Gillie Manners, RN  MD notified (1st page):  Julious Oka  Time of first page:  0715  MD notified (2nd page):  Time of second page:  Responding MD:  Julious Oka  Time MD responded:  [phone]

## 2013-09-05 NOTE — Progress Notes (Signed)
CARDIAC REHAB PHASE I   PRE:  Rate/Rhythm: 82 SR  BP:  Supine:   Sitting: 108/51  Standing:    SaO2: 100  MODE:  Ambulation: 460 ft   POST:  Rate/Rhythm: 84  BP:  Supine:   Sitting: 117/52  Standing:    SaO2: 100 RA 1123-1200  Assisted X 1 and used walker to ambulate. Gait steady with walker. Pt able to walk 460 feet without c/o. Pt to [facility] before and after walk to void. Back to recliner after walk with call light in reach.  Rodney Langton RN 09/05/2013 12:01 PM

## 2013-09-05 NOTE — Progress Notes (Signed)
Co-signed for Irfat Habib RN/BSN for medication administration, assessments, Is&Os, Vital Signs, Progress Notes, Care Plans, and Patient education. Kamden Reber M RN/BSN 

## 2013-09-05 NOTE — Progress Notes (Signed)
Patient ID: Nicole Hansen, female   DOB: [birth year], 73 y.o.   MRN: [id number]   SUBJECTIVE: Patient diuresed well yesterday after diuretic adjustment, creatinine stable.  Small black stool yesterday and hemoglobin down today again.   Scheduled Meds: . gabapentin  300 mg Oral BID  . levothyroxine  200 mcg Oral QAC breakfast  . metolazone  5 mg Oral Daily  . potassium chloride  40 mEq Oral Q4H  . sodium chloride  3 mL Intravenous Q12H  . sorbitol  15 mL Oral Daily  . spironolactone  25 mg Oral BID   Continuous Infusions: . furosemide (LASIX) infusion 20 mg/hr (09/05/13 0206)   PRN Meds:.acetaminophen, cyclobenzaprine, ondansetron (ZOFRAN) IV, ondansetron    Filed Vitals:   09/04/13 1454 09/04/13 1549 09/04/13 2024 09/05/13 0459  BP: 110/60 108/46 110/53 114/58  Pulse: 88 86 90 98  Temp: 97.8 F (36.6 C) 98.3 F (36.8 C) 98.3 F (36.8 C) 98.8 F (37.1 C)  TempSrc: Oral Oral Oral Oral  Resp: 18 16 18 18   Height:      Weight:    177 lb 6.4 oz (80.468 kg)  SpO2: 98% 100% 100% 95%    Intake/Output Summary (Last 24 hours) at 09/05/13 0823 Last data filed at 09/05/13 0503  Gross per 24 hour  Intake   1055 ml  Output   5276 ml  Net  -4221 ml    LABS: Basic Metabolic Panel:  Recent Labs  09/03/13 0640  09/04/13 0522 09/05/13 0448  NA 135*  < > 133* 134*  K 2.7*  < > 3.6* 2.7*  CL 91*  < > 91* 88*  CO2 30  < > 29 30  GLUCOSE 94  < > 93 87  BUN 30*  < > 32* 34*  CREATININE 0.86  < > 0.98 0.95  CALCIUM 8.6  < > 8.4 8.9  MG 2.0  --   --   --   < > = values in this interval not displayed. Liver Function Tests:  Recent Labs  09/03/13 0640  AST 26  ALT 13  ALKPHOS 86  BILITOT 0.7  PROT 5.9*  ALBUMIN 2.8*   No results found for this basename: LIPASE, AMYLASE,  in the last 72 hours CBC:  Recent Labs  09/04/13 1910 09/05/13 0448  WBC 6.0 4.9  HGB 7.4* 6.8*  HCT 23.4* 21.5*  MCV 93.2 94.7  PLT 116* 112*   Cardiac Enzymes: No results found for this  basename: CKTOTAL, CKMB, CKMBINDEX, TROPONINI,  in the last 72 hours BNP: No components found with this basename: POCBNP,  D-Dimer: No results found for this basename: DDIMER,  in the last 72 hours Hemoglobin A1C: No results found for this basename: HGBA1C,  in the last 72 hours Fasting Lipid Panel: No results found for this basename: CHOL, HDL, LDLCALC, TRIG, CHOLHDL, LDLDIRECT,  in the last 72 hours Thyroid Function Tests: No results found for this basename: TSH, T4TOTAL, FREET3, T3FREE, THYROIDAB,  in the last 72 hours Anemia Panel:  Recent Labs  09/03/13 0640  RETICCTPCT 4.8*    RADIOLOGY: Dg Chest 2 View  08/28/2013   CLINICAL DATA:  Weakness.  EXAM: CHEST  2 VIEW  COMPARISON:  CT 07/10/2013.  Chest x-ray 07/09/2013.  FINDINGS: Mediastinum and hilar structures normal. Prior CABG. Prior aortic valve replacement. Cardiomegaly with pulmonary vascular prominence and interstitial prominence with bilateral pleural effusions right side greater than left. Findings consistent with congestive heart failure. No pneumothorax. No acute bony abnormality.  IMPRESSION: 1. Congestive heart failure with pulmonary interstitial edema and pleural effusions.  2.  Prior CABG.  Aortic valve replacement.   Electronically Signed   By: Marcello Moores  Register   On: 08/28/2013 13:29    PHYSICAL EXAM General: NAD Neck: JVP 12-14 cm, no thyromegaly or thyroid nodule.  Lungs: Crackles at bases CV: Nondisplaced PMI.  Heart regular S1/S2, no S3/S4, 2/6 SEM RUSB.  1+ edema 1/2 to knees bilaterally.   Abdomen: Soft, nontender, no hepatosplenomegaly, no distention.  Neurologic: Alert and oriented x 3.  Psych: Normal affect. Extremities: No clubbing or cyanosis.   TELEMETRY: Reviewed telemetry pt in NSR  ASSESSMENT AND PLAN: 73 yo with history of bioprosthetic AVR and diastolic CHF presented with GI bleeding and acute on chronic diastolic CHF.  1. Acute blood loss anemia: Patient has been evaluated by GI, suspect  diverticular bleeding. Black stool last night, hemoglobin < 7 again today.  Would transfuse today, ?go ahead and do capsule study here rather than waiting for her to see outpatient GI given ongoing evidence of bleeding. 2. Acute on chronic diastolic CHF: Patient has gradually built up fluid per her report, suspect this was made worse by blood transfusions this admission. EF 55-60% on 6/15 echo. She diuresed well yesterday again, weight down 5 lbs.  She still has significant volume overload on exam.  - Continue Lasix gtt 20 mg/hr with metolazone 5 mg daily.  - Replete K aggressively (already ordered today).  3. Bioprosthetic aortic valve: On TEE this admission, the bioprosthetic valve opened normally but was small.  Mean gradient 20 mmHg suggests perhaps mild to moderate AS from patient-prosthetic mismatch.    4. Mitral stenosis: TEE this admission showed mild to moderate MR with heavy MAC and a restricted posterior leaflet.  The mean gradient across the valve was 10 mmHg but MVA by PHT was > 2.  I suspect that the elevated mean gradient may be more due to high flow (based on MVA by PHT) and that mitral stenosis is probably more mild.   5. CKD: Stable creatinine.   Nicole Hansen 09/05/2013 8:23 AM

## 2013-09-05 NOTE — Progress Notes (Signed)
Subjective: [name] was seen and examined this morning. She was up walking around the room and washing up. She reports feeling well today but did have another dark bowel movement yesterday. She wishes to try sorbitol bid. [name] wishes to be discharged by no later than Saturday morning. She has her family coming in from out of town and her husband who she cares for is home alone and she is worried about him. Pt denies muscle aches and cramps. She slept well overnight.    Objective: Vital signs in last 24 hours: Filed Vitals:   09/05/13 1128 09/05/13 1226 09/05/13 1330 09/05/13 1410  BP: 108/51 99/51 100/50 110/48  Pulse: 84 84 84 83  Temp:      TempSrc:      Resp:      Height:      Weight:      SpO2:       Weight change: -4 lb 9.6 oz (-2.087 kg)  Intake/Output Summary (Last 24 hours) at 09/05/13 1429 Last data filed at 09/05/13 1410  Gross per 24 hour  Intake   1750 ml  Output   6876 ml  Net  -5126 ml   Vitals reviewed. General: NAD, walking around HEENT: EOMI Cardiac: RRR, systolic mumur at lt sternal border Pulm: clear to auscultation b/l  Abd: soft, nontender, nondistended, BS present Ext: wearing ted hose b/l Neuro: alert and oriented X3, CN II-XII grossly intact  Lab Results: Basic Metabolic Panel:  Recent Labs Lab 09/02/13 0400 09/03/13 0640  09/04/13 0522 09/05/13 0448  NA 134* 135*  < > 133* 134*  K 3.2* 2.7*  < > 3.6* 2.7*  CL 92* 91*  < > 91* 88*  CO2 30 30  < > 29 30  GLUCOSE 92 94  < > 93 87  BUN 27* 30*  < > 32* 34*  CREATININE 0.90 0.86  < > 0.98 0.95  CALCIUM 8.6 8.6  < > 8.4 8.9  MG 1.8 2.0  --   --   --   < > = values in this interval not displayed. Liver Function Tests:  Recent Labs Lab 09/03/13 0640  AST 26  ALT 13  ALKPHOS 86  BILITOT 0.7  PROT 5.9*  ALBUMIN 2.8*   CBC:  Recent Labs Lab 08/30/13 1340  09/04/13 1910 09/05/13 0448  WBC 4.5  < > 6.0 4.9  NEUTROABS 2.6  --   --   --   HGB 8.2*  < > 7.4* 6.8*  HCT  26.4*  < > 23.4* 21.5*  MCV 93.0  < > 93.2 94.7  PLT 129*  < > 116* 112*  < > = values in this interval not displayed.  Medications: I have reviewed the patient's current medications. Scheduled Meds: . gabapentin  300 mg Oral BID  . levothyroxine  200 mcg Oral QAC breakfast  . metolazone  5 mg Oral Daily  . potassium chloride  40 mEq Oral Q4H  . [START ON 09/06/2013] potassium chloride  40 mEq Oral BID  . sodium chloride  3 mL Intravenous Q12H  . sorbitol  15 mL Oral BID  . spironolactone  25 mg Oral BID   Continuous Infusions: . 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 Weight down to 177 from 191lb's on admission, net  neg 24L since admission.  - appreciate HF team following: continue lasix gtt @20 , spironolactone 25mg  bid,  and metolazone 5mg  qd - agree with transfusion today and hopefully the ongoing diuresis can offset the extra volume impact - flexeril prn for cramps associated with aggressive diuresis  Acute blood loss anemia 2/2 likely GIB--diverticular vs hemorrhoidal bleed. Asymptomatic but orthostatic per DBP this morning. She has received a total of 3 units of PRBC this admission, last one being yesterday with post transfusion Hb 7.4 but down to 6.8 again this morning.  GI has seen her twice this admission and recommended out patient follow up with possible CE; appt made for Dr. Dorrene German from GI on 8/14 at 2pm. Hemolysis has been considered given AV mismatch, however LDH and haptoglobin levels not elevated but retic elevated at 4.8% and levels were drawn after blood transfusions.  -will transfuse one more unit today and follow up post transfusion cbc--hoping diuresis as per above plan will help compensate for the additional volume through the transfusion she  will receive  Hypokalemia--K 2.7 today with continued aggressive diuresis. Spironolactone on board.  - continue spironolactone 25mg  BID, KDur scheduled for 5 doses today, will decrease to BID tomorrow.  - PRBC transfusion today should help K some but also giving oral supplementation  - check Mag and AM BMET  Thrombocytopenia- Have been low since June 2015 in our records <150 usually. - not on heparin, trending up to 112 today - will continue to monitor   Constipation -increase sorbitol to bid, she prefers with breakfast and before bed  DVT: SCD's Diet: HH Dispo: Anticipated discharge August 1st AM at the latest  The patient does have a current PCP Nicole Maid, MD) and does need an [facility] hospital follow-up appointment after discharge.  The patient does not have transportation limitations that hinder transportation to clinic appointments.  Services Needed at time of discharge: Y = Yes, Blank = No PT:   OT:   RN:   Equipment:   Other:     LOS: 8 days   Jerene Pitch, MD 09/05/2013, 2:29 PM

## 2013-09-05 NOTE — Progress Notes (Signed)
Internal Medicine Attending  Date: 09/05/2013  Patient name: Nicole Hansen record number: [id number] Date of birth: Feb 21, 1940 Age: 73 y.o. Gender: female  I saw and evaluated the patient. I developed the assessment and plan with the housestaff and reviewed the resident's note by Dr. Eula Fried.  I agree with the resident's findings and plans as documented in her progress note.  Nicole Hansen continues to feel well today and was up and about the room without dyspnea during our rounds this morning. She has family visiting who she has not seen in 49 years and she remains very concerned about her husband at home. She therefore is requesting discharge on Saturday morning. Although she is volume overloaded she is relatively asymptomatic and this request is reasonable given the time that she has spent with [place]. That gives [place] an additional 3 days of aggressive diuresis and hopes of getting her near her dry weight. We will transfuse her 1 unit of packed red blood cells for her anemia less than 7.0 today, even though she is asymptomatic, because she's on the Lasix drip and this should help compensate for the additional volume she will receive from the transfusion.

## 2013-09-05 NOTE — Progress Notes (Signed)
Report is given to oncoming RN. Pt is resting, has no complaints.

## 2013-09-05 NOTE — Progress Notes (Signed)
Critical level Hgb=6.8 relayed to Dr. Marvel Plan.

## 2013-09-05 NOTE — Progress Notes (Signed)
CRITICAL VALUE ALERT  Critical value received:  Hgb=6.8  Date of notification:  09/05/2013  Time of notification:  0649  Critical value read back:Yes.    Nurse who received alert:  R. Bretta Fees rn  MD notified (1st page): Dr. Marvel Plan  Time of first page: [phone]  MD notified (2nd page):  Time of second page:  Responding MD:  Dr. Marvel Plan  Time MD responded: [phone]

## 2013-09-06 LAB — CBC
HCT: 23.6 % — ABNORMAL LOW (ref 36.0–46.0)
Hemoglobin: 7.5 g/dL — ABNORMAL LOW (ref 12.0–15.0)
MCH: 29.9 pg (ref 26.0–34.0)
MCHC: 31.8 g/dL (ref 30.0–36.0)
MCV: 94 fL (ref 78.0–100.0)
PLATELETS: 131 10*3/uL — AB (ref 150–400)
RBC: 2.51 MIL/uL — AB (ref 3.87–5.11)
RDW: 15.7 % — ABNORMAL HIGH (ref 11.5–15.5)
WBC: 5.8 10*3/uL (ref 4.0–10.5)

## 2013-09-06 LAB — TYPE AND SCREEN
ABO/RH(D): O POS
Antibody Screen: NEGATIVE
Unit division: 0
Unit division: 0

## 2013-09-06 LAB — BASIC METABOLIC PANEL
Anion gap: 13 (ref 5–15)
BUN: 33 mg/dL — AB (ref 6–23)
CO2: 29 mEq/L (ref 19–32)
Calcium: 9 mg/dL (ref 8.4–10.5)
Chloride: 89 mEq/L — ABNORMAL LOW (ref 96–112)
Creatinine, Ser: 0.86 mg/dL (ref 0.50–1.10)
GFR calc Af Amer: 76 mL/min — ABNORMAL LOW (ref >90)
GFR, EST NON AFRICAN AMERICAN: 65 mL/min — AB (ref >90)
GLUCOSE: 87 mg/dL (ref 70–99)
Potassium: 4.3 mEq/L (ref 3.7–5.3)
Sodium: 131 mEq/L — ABNORMAL LOW (ref 137–147)

## 2013-09-06 NOTE — Progress Notes (Signed)
Co-signed for Irfat Habib RN/BSN for medication administration, assessments, Is&Os, Vital Signs, Progress Notes, Care Plans, and Patient education. Cher Egnor M RN/BSN 

## 2013-09-06 NOTE — Progress Notes (Signed)
Patient ID: Nicole Hansen, female   DOB: 1940/03/22, 73 y.o.   MRN: [id number]   SUBJECTIVE:  Continues to diurese with lasix drip and metolazone. Weight unchanged. -4.4 liters urine. Yesterday she was transfused with hemoglobin 6.8. Today Hgb 7.5 . One black BM this am.   Denies SOB/Orthopnea.    Scheduled Meds: . gabapentin  300 mg Oral BID  . levothyroxine  200 mcg Oral QAC breakfast  . metolazone  5 mg Oral Daily  . potassium chloride  40 mEq Oral BID  . sodium chloride  3 mL Intravenous Q12H  . sorbitol  15 mL Oral BID  . spironolactone  25 mg Oral BID   Continuous Infusions: . furosemide (LASIX) infusion 20 mg/hr (09/06/13 0256)   PRN Meds:.acetaminophen, cyclobenzaprine, ondansetron (ZOFRAN) IV, ondansetron    Filed Vitals:   09/05/13 1410 09/05/13 1500 09/06/13 0405 09/06/13 0541  BP: 110/48 111/50 99/53   Pulse: 83 82 82   Temp:  97.7 F (36.5 C) 97.5 F (36.4 C)   TempSrc:  Oral Oral   Resp:  16 14   Height:      Weight:    177 lb 3.2 oz (80.377 kg)  SpO2:  100% 94%     Intake/Output Summary (Last 24 hours) at 09/06/13 1000 Last data filed at 09/06/13 0400  Gross per 24 hour  Intake    935 ml  Output   5050 ml  Net  -4115 ml    LABS: Basic Metabolic Panel:  Recent Labs  09/05/13 0448 09/06/13 0607  NA 134* 131*  K 2.7* 4.3  CL 88* 89*  CO2 30 29  GLUCOSE 87 87  BUN 34* 33*  CREATININE 0.95 0.86  CALCIUM 8.9 9.0   Liver Function Tests: No results found for this basename: AST, ALT, ALKPHOS, BILITOT, PROT, ALBUMIN,  in the last 72 hours No results found for this basename: LIPASE, AMYLASE,  in the last 72 hours CBC:  Recent Labs  09/05/13 0448 09/05/13 1535 09/06/13 0607  WBC 4.9  --  5.8  HGB 6.8* 7.9* 7.5*  HCT 21.5* 24.4* 23.6*  MCV 94.7  --  94.0  PLT 112*  --  131*   Cardiac Enzymes: No results found for this basename: CKTOTAL, CKMB, CKMBINDEX, TROPONINI,  in the last 72 hours BNP: No components found with this basename: POCBNP,   D-Dimer: No results found for this basename: DDIMER,  in the last 72 hours Hemoglobin A1C: No results found for this basename: HGBA1C,  in the last 72 hours Fasting Lipid Panel: No results found for this basename: CHOL, HDL, LDLCALC, TRIG, CHOLHDL, LDLDIRECT,  in the last 72 hours Thyroid Function Tests: No results found for this basename: TSH, T4TOTAL, FREET3, T3FREE, THYROIDAB,  in the last 72 hours Anemia Panel: No results found for this basename: VITAMINB12, FOLATE, FERRITIN, TIBC, IRON, RETICCTPCT,  in the last 72 hours  RADIOLOGY: Dg Chest 2 View  08/28/2013   CLINICAL DATA:  Weakness.  EXAM: CHEST  2 VIEW  COMPARISON:  CT 07/10/2013.  Chest x-ray 07/09/2013.  FINDINGS: Mediastinum and hilar structures normal. Prior CABG. Prior aortic valve replacement. Cardiomegaly with pulmonary vascular prominence and interstitial prominence with bilateral pleural effusions right side greater than left. Findings consistent with congestive heart failure. No pneumothorax. No acute bony abnormality.  IMPRESSION: 1. Congestive heart failure with pulmonary interstitial edema and pleural effusions.  2.  Prior CABG.  Aortic valve replacement.   Electronically Signed   By: Marcello Moores  Register  On: 08/28/2013 13:29    PHYSICAL EXAM General: NAD Neck: JVP 11-12, no thyromegaly or thyroid nodule.  Lungs: Clear CV: Nondisplaced PMI.  Heart regular S1/S2, no S3/S4, 2/6 SEM RUSB.  1+ edema 1/2 to knees bilaterally.   Abdomen: Soft, nontender, no hepatosplenomegaly, no distention.  Neurologic: Alert and oriented x 3.  Psych: Normal affect. Extremities: No clubbing or cyanosis. R and L thigh 3+edema.   TELEMETRY: Reviewed telemetry pt in NSR  ASSESSMENT AND PLAN: 73 yo with history of bioprosthetic AVR and diastolic CHF presented with GI bleeding and acute on chronic diastolic CHF.  1. Acute blood loss anemia: Patient has been evaluated by GI, suspect diverticular bleeding. Black stool this am.  7.5 after  transfusion. May need to consult GI again if hemoglobin drops. Will need close outpatient GI follow up given ongoing evidence of bleeding. 2. Acute on chronic diastolic CHF: . EF 95-32% on 6/15 echo. She diuresed well yesterday again, although weight unchanged? _ 4 liters. Still with volume overload. am.  - Continue Lasix gtt 20 mg/hr with metolazone 5 mg daily.  - Replete K aggressively (already ordered today).  3. Bioprosthetic aortic valve: On TEE this admission, the bioprosthetic valve opened normally but was small.  Mean gradient 20 mmHg suggests perhaps mild to moderate AS from patient-prosthetic mismatch.    4. Mitral stenosis: TEE this admission showed mild to moderate MR with heavy MAC and a restricted posterior leaflet.  The mean gradient across the valve was 10 mmHg but MVA by PHT was > 2.   5. CKD: Stable creatinine.   CLEGG,AMY NP-C  09/06/2013 10:00 AM   Patient seen and examined with Darrick Grinder, NP. We discussed all aspects of the encounter. I agree with the assessment and plan as stated above.   She remains volume overloaded but diuresing well. Would continue current regimen. Supp K+  Spruha Weight,MD 10:25 AM

## 2013-09-06 NOTE — Progress Notes (Signed)
Internal Medicine Attending  Date: 09/06/2013  Patient name: Nicole record number: [id number] Date of birth: May 04, 1940 Age: 73 y.o. Gender: female  I saw and evaluated the patient. I developed the assessment and plan with the housestaff and reviewed the resident's note by Dr. Hulen Hansen.  I agree with the resident's findings and plans as documented in her progress note.  [name] continues to feel well without dyspnea or orthopnea. She also continues to diurese well having put out more than 3 L of urine yesterday. We are continuing the aggressive diuresis with the regimen outlined by the heart failure service. We will continue the current regimen with anticipated discharge home early Saturday morning.

## 2013-09-06 NOTE — Progress Notes (Signed)
Subjective: Pt has no complaints. She had one BM last night w/ black stools. She did require flexiril overnight for muscle cramping.   Objective: Vital signs in last 24 hours: Filed Vitals:   09/05/13 1500 09/06/13 0405 09/06/13 0541 09/06/13 1022  BP: 111/50 99/53  110/47  Pulse: 82 82  84  Temp: 97.7 F (36.5 C) 97.5 F (36.4 C)    TempSrc: Oral Oral    Resp: 16 14    Height:      Weight:   177 lb 3.2 oz (80.377 kg)   SpO2: 100% 94%     Weight change: -3.2 oz (-0.091 kg)  Intake/Output Summary (Last 24 hours) at 09/06/13 1048 Last data filed at 09/06/13 0400  Gross per 24 hour  Intake    935 ml  Output   4250 ml  Net  -3315 ml   Vitals reviewed. General: NAD HEENT: EOMI Cardiac: RRR, systolic mumur at lt sternal border Pulm: clear to auscultation b/l  Abd: soft, nontender, nondistended, BS present Ext: wearing ted hose b/l Neuro: alert and oriented X3, CN II-XII grossly intact  Lab Results: Basic Metabolic Panel:  Recent Labs Lab 09/02/13 0400 09/03/13 0640  09/05/13 0448 09/06/13 0607  NA 134* 135*  < > 134* 131*  K 3.2* 2.7*  < > 2.7* 4.3  CL 92* 91*  < > 88* 89*  CO2 30 30  < > 30 29  GLUCOSE 92 94  < > 87 87  BUN 27* 30*  < > 34* 33*  CREATININE 0.90 0.86  < > 0.95 0.86  CALCIUM 8.6 8.6  < > 8.9 9.0  MG 1.8 2.0  --   --   --   < > = values in this interval not displayed. Liver Function Tests:  Recent Labs Lab 09/03/13 0640  AST 26  ALT 13  ALKPHOS 86  BILITOT 0.7  PROT 5.9*  ALBUMIN 2.8*   CBC:  Recent Labs Lab 08/30/13 1340  09/05/13 0448 09/05/13 1535 09/06/13 0607  WBC 4.5  < > 4.9  --  5.8  NEUTROABS 2.6  --   --   --   --   HGB 8.2*  < > 6.8* 7.9* 7.5*  HCT 26.4*  < > 21.5* 24.4* 23.6*  MCV 93.0  < > 94.7  --  94.0  PLT 129*  < > 112*  --  131*  < > = values in this interval not displayed.  Medications: I have reviewed the patient's current medications. Scheduled Meds: . gabapentin  300 mg Oral BID  . levothyroxine   200 mcg Oral QAC breakfast  . metolazone  5 mg Oral Daily  . potassium chloride  40 mEq Oral BID  . sodium chloride  3 mL Intravenous Q12H  . sorbitol  15 mL Oral BID  . spironolactone  25 mg Oral BID   Continuous Infusions: . 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 Weight down to 177 from 191lb's on admission, net neg 26.5L since admission.  - no decrease in weight today 2/2 1 unit RBC transfusion yesterday - appreciate HF team following: continue lasix gtt @20 , spironolactone 25mg  bid,  and metolazone 5mg  qd - flexeril prn for cramps associated with aggressive diuresis  Acute blood loss anemia 2/2 likely GIB--diverticular vs hemorrhoidal  bleed. Asymptomatic but orthostatic per DBP this morning. She has received a total of 5 units of PRBC this admission, last one being yesterday with post transfusion Hb 7.9 but down to 7.5 this morning.  GI has seen her twice this admission and recommended out patient follow up with possible CE; appt made for Dr. Dorrene German from GI on 8/14 at 2pm. Hemolysis has been considered given AV mismatch, however LDH and haptoglobin levels not elevated but retic elevated at 4.8% and levels were drawn after blood transfusions.  - will continue to monitor  Hypokalemia--improved s/p 1 unit RBC transfusion and po KDur. K 4.3 today with continued aggressive diuresis.  - continue spironolactone 25mg  BID, KDur BID   Thrombocytopenia- Have been low since June 2015 in our records <150 usually. - not on heparin, trending up to 131 from 112 yesterday - will continue to monitor   Constipation -increase sorbitol to bid, she prefers with breakfast and before bed  DVT: SCD's Diet: HH Dispo: Anticipated discharge August 1st AM at the latest  The patient  does have a current PCP Rubie Maid, MD) and does need an [facility] hospital follow-up appointment after discharge.  The patient does not have transportation limitations that hinder transportation to clinic appointments.    LOS: 9 days   Julious Oka, MD 09/06/2013, 10:48 AM

## 2013-09-06 NOTE — Progress Notes (Signed)
CARDIAC REHAB PHASE I   PRE:  Rate/Rhythm: 87 SR    BP: sitting 110/60    SaO2: 98 RA  MODE:  Ambulation: 480 ft   POST:  Rate/Rhythm: 91 SR    BP: sitting 128/60     SaO2: 100 RA  Pt eager to walk. Used RW, slight limp she sts due to neuropathy. Thankful to walk but had to return to urinate. To recliner. [place], [place], ACSM 09/06/2013 3:25 PM

## 2013-09-06 NOTE — Progress Notes (Signed)
Report given to oncoming RN. Pt has no complaints, no signs and symptoms of distress. She is resting in chair.

## 2013-09-07 DIAGNOSIS — K625 Hemorrhage of anus and rectum: Secondary | ICD-10-CM

## 2013-09-07 LAB — CBC WITH DIFFERENTIAL/PLATELET
BASOS ABS: 0 10*3/uL (ref 0.0–0.1)
Basophils Relative: 0 % (ref 0–1)
EOS PCT: 2 % (ref 0–5)
Eosinophils Absolute: 0.1 10*3/uL (ref 0.0–0.7)
HEMATOCRIT: 25 % — AB (ref 36.0–46.0)
HEMOGLOBIN: 7.8 g/dL — AB (ref 12.0–15.0)
Lymphocytes Relative: 22 % (ref 12–46)
Lymphs Abs: 1.1 10*3/uL (ref 0.7–4.0)
MCH: 29.9 pg (ref 26.0–34.0)
MCHC: 31.2 g/dL (ref 30.0–36.0)
MCV: 95.8 fL (ref 78.0–100.0)
MONO ABS: 0.4 10*3/uL (ref 0.1–1.0)
MONOS PCT: 9 % (ref 3–12)
NEUTROS ABS: 3.2 10*3/uL (ref 1.7–7.7)
Neutrophils Relative %: 67 % (ref 43–77)
Platelets: 159 10*3/uL (ref 150–400)
RBC: 2.61 MIL/uL — ABNORMAL LOW (ref 3.87–5.11)
RDW: 15.7 % — AB (ref 11.5–15.5)
WBC: 4.8 10*3/uL (ref 4.0–10.5)

## 2013-09-07 LAB — BASIC METABOLIC PANEL
Anion gap: 14 (ref 5–15)
BUN: 32 mg/dL — ABNORMAL HIGH (ref 6–23)
CO2: 33 meq/L — AB (ref 19–32)
CREATININE: 1.02 mg/dL (ref 0.50–1.10)
Calcium: 9.3 mg/dL (ref 8.4–10.5)
Chloride: 86 mEq/L — ABNORMAL LOW (ref 96–112)
GFR calc Af Amer: 62 mL/min — ABNORMAL LOW (ref >90)
GFR calc non Af Amer: 53 mL/min — ABNORMAL LOW (ref >90)
Glucose, Bld: 134 mg/dL — ABNORMAL HIGH (ref 70–99)
Potassium: 3.4 mEq/L — ABNORMAL LOW (ref 3.7–5.3)
Sodium: 133 mEq/L — ABNORMAL LOW (ref 137–147)

## 2013-09-07 MED ORDER — SORBITOL 70 % SOLN: 15.0000 mL | Freq: Two times a day (BID) | Status: AC | PRN

## 2013-09-07 MED ORDER — TORSEMIDE 20 MG PO TABS: 80.0000 mg | ORAL_TABLET | Freq: Two times a day (BID) | ORAL | Status: AC

## 2013-09-07 NOTE — Progress Notes (Signed)
Nutrition Brief Note  Patient identified due to extended Length of Stay   Wt Readings from Last 15 Encounters:  09/07/13 174 lb 9.6 oz (79.198 kg)  09/07/13 174 lb 9.6 oz (79.198 kg)  09/07/13 174 lb 9.6 oz (79.198 kg)  08/13/13 181 lb 4 oz (82.214 kg)  07/30/13 175 lb 2 oz (79.436 kg)  07/26/13 173 lb (78.472 kg)    Body mass index is 26.55 kg/(m^2). Patient meets criteria for Overweight based on current BMI. Pt's weight has been stable.   Current diet order is Heart Healthy, patient is consuming approximately 100% of meals at this time. Labs and medications reviewed.   Per MD note, anticipated discharge home early Saturday morning.No nutrition interventions warranted at this time. If nutrition issues arise, please consult RD.   Pryor Ochoa RD, LDN Inpatient Clinical Dietitian Pager: [phone] After Hours Pager: [phone]

## 2013-09-07 NOTE — Progress Notes (Signed)
Subjective: Pt has no complaints this morning. Denies SOB, chest pain, dizziness, and HAs. She did have a BM w/ dark stools again last night. She reports improvement in LE edema, states that she can now see her knee caps.   Objective: Vital signs in last 24 hours: Filed Vitals:   09/06/13 1753 09/06/13 2100 09/07/13 0627 09/07/13 1029  BP: 101/48 100/46 84/41 104/56  Pulse:  85 83 66  Temp:  97.5 F (36.4 C) 98 F (36.7 C)   TempSrc:  Oral Oral   Resp:  18 18 18   Height:      Weight:   174 lb 9.6 oz (79.198 kg)   SpO2:  100% 96% 98%   Weight change: -2 lb 9.6 oz (-1.179 kg)  Intake/Output Summary (Last 24 hours) at 09/07/13 1158 Last data filed at 09/07/13 0837  Gross per 24 hour  Intake 2725.83 ml  Output   3750 ml  Net -1024.17 ml   Vitals reviewed. General: NAD HEENT: EOMI Cardiac: RRR, systolic mumur at lt sternal border Pulm: clear to auscultation b/l  Abd: soft, nontender, nondistended, BS present Ext: wearing ted hose b/l, trace pitting edema thighs b/l Neuro: alert and oriented X3, CN II-XII grossly intact  Lab Results: Basic Metabolic Panel:  Recent Labs Lab 09/02/13 0400 09/03/13 0640  09/06/13 0607 09/07/13 0938  NA 134* 135*  < > 131* 133*  K 3.2* 2.7*  < > 4.3 3.4*  CL 92* 91*  < > 89* 86*  CO2 30 30  < > 29 33*  GLUCOSE 92 94  < > 87 134*  BUN 27* 30*  < > 33* 32*  CREATININE 0.90 0.86  < > 0.86 1.02  CALCIUM 8.6 8.6  < > 9.0 9.3  MG 1.8 2.0  --   --   --   < > = values in this interval not displayed. Liver Function Tests:  Recent Labs Lab 09/03/13 0640  AST 26  ALT 13  ALKPHOS 86  BILITOT 0.7  PROT 5.9*  ALBUMIN 2.8*   CBC:  Recent Labs Lab 09/06/13 0607 09/07/13 0938  WBC 5.8 4.8  NEUTROABS  --  3.2  HGB 7.5* 7.8*  HCT 23.6* 25.0*  MCV 94.0 95.8  PLT 131* 159    Medications: I have reviewed the patient's current medications. Scheduled Meds: . gabapentin  300 mg Oral BID  . levothyroxine  200 mcg Oral QAC breakfast   . metolazone  5 mg Oral Daily  . potassium chloride  40 mEq Oral BID  . sodium chloride  3 mL Intravenous Q12H  . sorbitol  15 mL Oral BID  . spironolactone  25 mg Oral BID   Continuous Infusions: . 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 Weight down to 174 from 191lb's on admission, net neg 28.5L since admission.  - appreciate HF team following: will d/c pt home w/ torsemide 80mg  BID with CHF clinic f/u on 8/5 at 2:00pm, d/c lasix gtt and metolazone which she has already received one dose of today.   Acute blood loss anemia 2/2 likely GIB--diverticular vs hemorrhoidal bleed. She has received a total of 5 units of PRBC this admission.  She has an appt w/ Dr. Dorrene German from GI on 8/14 at 2pm and will reassess for lower capsule endoscopy  then.  - will continue to monitor. Hgb 7.8 today.   Hypokalemia--K+ 3.4 today, K+ low this admission due to aggressive diuresis   - continue spironolactone 25mg  BID, KDur BID    Constipation -sorbitol to bid, she prefers with breakfast and before bed  DVT: SCD's Diet: HH Dispo: Discharge today  The patient does have a current PCP Rubie Maid, MD) and does need an [facility] hospital follow-up appointment after discharge.  The patient does not have transportation limitations that hinder transportation to clinic appointments.    LOS: 10 days   Julious Oka, MD 09/07/2013, 11:58 AM

## 2013-09-07 NOTE — Progress Notes (Signed)
Patient ID: Nicole Hansen, female   DOB: [birth year], 73 y.o.   MRN: [id number]   SUBJECTIVE: Weight down 3 lbs. No labs today yet.  Scheduled Meds: . gabapentin  300 mg Oral BID  . levothyroxine  200 mcg Oral QAC breakfast  . metolazone  5 mg Oral Daily  . potassium chloride  40 mEq Oral BID  . sodium chloride  3 mL Intravenous Q12H  . sorbitol  15 mL Oral BID  . spironolactone  25 mg Oral BID   Continuous Infusions: . furosemide (LASIX) infusion 20 mg/hr (09/06/13 1745)   PRN Meds:.acetaminophen, cyclobenzaprine, ondansetron (ZOFRAN) IV, ondansetron    Filed Vitals:   09/06/13 1022 09/06/13 1753 09/06/13 2100 09/07/13 0627  BP: 110/47 101/48 100/46 84/41  Pulse: 84  85 83  Temp:   97.5 F (36.4 C) 98 F (36.7 C)  TempSrc:   Oral Oral  Resp:   18 18  Height:      Weight:    174 lb 9.6 oz (79.198 kg)  SpO2:   100% 96%    Intake/Output Summary (Last 24 hours) at 09/07/13 0748 Last data filed at 09/07/13 0700  Gross per 24 hour  Intake 2365.83 ml  Output   4150 ml  Net -1784.17 ml    LABS: Basic Metabolic Panel:  Recent Labs  09/05/13 0448 09/06/13 0607  NA 134* 131*  K 2.7* 4.3  CL 88* 89*  CO2 30 29  GLUCOSE 87 87  BUN 34* 33*  CREATININE 0.95 0.86  CALCIUM 8.9 9.0   Liver Function Tests: No results found for this basename: AST, ALT, ALKPHOS, BILITOT, PROT, ALBUMIN,  in the last 72 hours No results found for this basename: LIPASE, AMYLASE,  in the last 72 hours CBC:  Recent Labs  09/05/13 0448 09/05/13 1535 09/06/13 0607  WBC 4.9  --  5.8  HGB 6.8* 7.9* 7.5*  HCT 21.5* 24.4* 23.6*  MCV 94.7  --  94.0  PLT 112*  --  131*   Cardiac Enzymes: No results found for this basename: CKTOTAL, CKMB, CKMBINDEX, TROPONINI,  in the last 72 hours BNP: No components found with this basename: POCBNP,  D-Dimer: No results found for this basename: DDIMER,  in the last 72 hours Hemoglobin A1C: No results found for this basename: HGBA1C,  in the last 72  hours Fasting Lipid Panel: No results found for this basename: CHOL, HDL, LDLCALC, TRIG, CHOLHDL, LDLDIRECT,  in the last 72 hours Thyroid Function Tests: No results found for this basename: TSH, T4TOTAL, FREET3, T3FREE, THYROIDAB,  in the last 72 hours Anemia Panel: No results found for this basename: VITAMINB12, FOLATE, FERRITIN, TIBC, IRON, RETICCTPCT,  in the last 72 hours  RADIOLOGY: Dg Chest 2 View  08/28/2013   CLINICAL DATA:  Weakness.  EXAM: CHEST  2 VIEW  COMPARISON:  CT 07/10/2013.  Chest x-ray 07/09/2013.  FINDINGS: Mediastinum and hilar structures normal. Prior CABG. Prior aortic valve replacement. Cardiomegaly with pulmonary vascular prominence and interstitial prominence with bilateral pleural effusions right side greater than left. Findings consistent with congestive heart failure. No pneumothorax. No acute bony abnormality.  IMPRESSION: 1. Congestive heart failure with pulmonary interstitial edema and pleural effusions.  2.  Prior CABG.  Aortic valve replacement.   Electronically Signed   By: Marcello Moores  Register   On: 08/28/2013 13:29    PHYSICAL EXAM General: NAD Neck: JVP 12-14 cm, no thyromegaly or thyroid nodule.  Lungs: Crackles at bases CV: Nondisplaced PMI.  Heart regular S1/S2,  no S3/S4, 2/6 SEM RUSB.  1+ ankle edema bilaterally.   Abdomen: Soft, nontender, no hepatosplenomegaly, no distention.  Neurologic: Alert and oriented x 3.  Psych: Normal affect. Extremities: No clubbing or cyanosis.   TELEMETRY: Reviewed telemetry pt in NSR  ASSESSMENT AND PLAN: 73 yo with history of bioprosthetic AVR and diastolic CHF presented with GI bleeding and acute on chronic diastolic CHF.  1. Acute blood loss anemia: Patient has been evaluated by GI, suspect diverticular bleeding. Awaiting CBC today.  2. Acute on chronic diastolic CHF: Patient has gradually built up fluid per her report, suspect this was made worse by blood transfusions this admission. EF 55-60% on 6/15 echo. She  diuresed well yesterday again, weight down 3 lbs.  She still has significant volume overload on exam.  - Continue Lasix gtt 20 mg/hr with metolazone 5 mg daily.  - Replete K aggressively (awaiting labs). - Needs to go home tomorrow for a birthday party.  Would send home on torsemide 80 mg bid with close followup in CHF clinic.   3. Bioprosthetic aortic valve: On TEE this admission, the bioprosthetic valve opened normally but was small.  Mean gradient 20 mmHg suggests perhaps mild to moderate AS from patient-prosthetic mismatch.    4. Mitral stenosis: TEE this admission showed mild to moderate MR with heavy MAC and a restricted posterior leaflet.  The mean gradient across the valve was 10 mmHg but MVA by PHT was > 2.  I suspect that the elevated mean gradient may be more due to high flow (based on MVA by PHT) and that mitral stenosis is probably more mild.   5. CKD: Stable creatinine so far but awaiting today's labs.    [name] 09/07/2013 7:48 AM

## 2013-09-07 NOTE — Progress Notes (Signed)
Internal Medicine Attending  Date: 09/07/2013  Patient name: [place] record number: [id number] Date of birth: 08/07/1940 Age: 73 y.o. Gender: female  I saw and evaluated the patient. I developed the assessment and plan with the housestaff and reviewed the resident's note by Dr. Hulen Luster. I agree with the resident's findings and plans as documented in her progress note.  Ms. Rasor again feels well with continued aggressive diuresis.  Examination reveals improvement in her BLE edema, albeit she is now also wearing knee high ted hose.  She has family in town and very much would like to go home.  As she is asymptomatic, we will convert her to an oral regimen as outlined by the Heart Failure Service.  She will be discharged with follow-up in the Heart Failure Clinic as well as with her GI physician and PCP.

## 2013-09-07 NOTE — Progress Notes (Signed)
Discussed discharge instructions with pt including how and when to call the dr, follow up appts, medications to take at home, diet, and activity. Pt verbalized understanding and denied any questions.  Eulis Canner, RN

## 2013-09-07 NOTE — Discharge Summary (Signed)
Name: Nicole Hansen MRN: [id number] DOB: 09/22/1940 73 y.o. PCP: Nicole Maid, MD  Date of Admission: 08/28/2013  1:14 PM Date of Discharge: 09/07/2013 Attending Physician: No att. providers found  Discharge Diagnosis: Principal Problem:   Acute on chronic diastolic CHF (congestive heart failure) Active Problems:   Hypotension   Hypokalemia   Acute GI bleeding   AKI (acute kidney injury)   Acute posthemorrhagic anemia   Anemia, unspecified  Discharge Medications:   Medication List    STOP taking these medications       metolazone 2.5 MG tablet  Commonly known as:  ZAROXOLYN      TAKE these medications       acetaminophen 325 MG tablet  Commonly known as:  TYLENOL  Take 650 mg by mouth every 6 (six) hours as needed (pain).     ferrous sulfate 325 (65 FE) MG tablet  Take 325 mg by mouth 3 (three) times daily with meals.     gabapentin 300 MG capsule  Commonly known as:  NEURONTIN  Take 300 mg by mouth 2 (two) times daily.     levothyroxine 200 MCG tablet  Commonly known as:  SYNTHROID, LEVOTHROID  Take 1 tablet (200 mcg total) by mouth daily before breakfast.     LORazepam 1 MG tablet  Commonly known as:  ATIVAN  Take 1 mg by mouth at bedtime as needed for anxiety or sleep. sleep     MOISTURE Crea  Apply 1 application topically at bedtime.     potassium chloride SA 20 MEQ tablet  Commonly known as:  K-DUR,KLOR-CON  Take 20 mEq by mouth 3 (three) times daily.     sorbitol 70 % Soln  Take 15 mLs by mouth 2 (two) times daily as needed for moderate constipation.     spironolactone 25 MG tablet  Commonly known as:  ALDACTONE  Take 1 tablet (25 mg total) by mouth daily.     torsemide 20 MG tablet  Commonly known as:  DEMADEX  Take 4 tablets (80 mg total) by mouth 2 (two) times daily.        Disposition and follow-up:   Nicole Hansen was discharged from [facility] in Stable condition.  At the hospital follow up visit please  address:  1.  Please adjust diuretics as needed. Pt was d/c'd home w/ torsemide 80mg  BID until her CHF clinic appointment.   2.  Labs / imaging needed at time of follow-up: CBC to follow Hgb and BMET for hypokalemia 2/2 diuresis.   3.  Pending labs/ test needing follow-up: none  Follow-up Appointments:     Follow-up Information   Follow up with Alger Memos, MD On 09/21/2013. (arrive 2 pm. )    Specialty:  Gastroenterology   Contact information:   [address] [place] [address] [phone]       Follow up with Nicole Maid, MD On 09/14/2013. (at 1:15 pm for hospital f/u)    Specialty:  Family Medicine   Contact information:   [address] [place] [address] [phone]       Follow up with [facility] On 09/12/2013. (at 2:00 pm)       Discharge Instructions:  Torsemide 80mg  BID until CHF appointment. Continue taking home potassium supplements.    Consultations:   Heart Failure team.   Procedures Performed:  Dg Chest 2 View  08/28/2013   CLINICAL DATA:  Weakness.  EXAM: CHEST  2 VIEW  COMPARISON:  CT 07/10/2013.  Chest x-ray 07/09/2013.  FINDINGS: Mediastinum and hilar structures normal. Prior CABG. Prior aortic valve replacement. Cardiomegaly with pulmonary vascular prominence and interstitial prominence with bilateral pleural effusions right side greater than left. Findings consistent with congestive heart failure. No pneumothorax. No acute bony abnormality.  IMPRESSION: 1. Congestive heart failure with pulmonary interstitial edema and pleural effusions.  2.  Prior CABG.  Aortic valve replacement.   Electronically Signed   By: Marcello Moores  Register   On: 08/28/2013 13:29    Admission HPI: Pt is a 73 y/o F w/ hx of aortic replacement (bovine), dCHF, hemorrhoids, hypothyroidism s/p thyroidectomy, and HTN who presents with BRBPR. Starting last night while urinating she noticed BRBPR that was heavy x 10 minutes. Since then she has noticed constant  dripping of blood to the point it saturated her rug. Pt is on iron supplementation at home for her anemia and has constipation from this as well as black stools. Her last BM was 5 days ago. She has hemorrhoids that bleed occasionally when she has a hard stool. Pt denies SOB, abdominal pain or chest pain. She does endorse dizziness, weakness, and fatigue. She had a colonoscopy and an EGD in [place] for anemia w/ Dr. Colon Branch that she believes was normal. Per HF clinic note EGD and colo was negative for bleeding. Pt has chronically low blood pressures around the 90's/50's. She denies any liver disease.  Pt reports increased swelling in feet and face. She was last admitted in June for volume overload at which time 42 lbs was diuresed and her d/c weight was 173 lbs. She is currently taking torsemide 40mg  in the am and 20mg  in the pm.    Hospital Course by problem list: Principal Problem:   Acute on chronic diastolic CHF (congestive heart failure) Active Problems:   Hypotension   Hypokalemia   Acute GI bleeding   AKI (acute kidney injury)   Acute posthemorrhagic anemia   Anemia, unspecified   BRBPR ----diverticular vs hemorrhoidal bleed that apparently resolved. However later during hospital course Hgb started to drop and patient reported having dark stools w/ BMs. GI has seen her twice this admission and recommended out patient follow up with possible CE; appt made for Dr. Dorrene German from GI on 8/14 at 2pm. Hemolysis has been considered given AV mismatch, however LDH and haptoglobin levels not elevated but retic elevated at 4.8% and levels were drawn after blood transfusions. She has received a total of 5 units of PRBC this admission.  Acute on chronic diastolic HF: ECHO on 6/2 EF 55%, moderate mitral stenosis, mild aortic stenosis. CXR on admission- CHF w/ pulmonary interstitial edema and pleural effusions. BNP on admission 2110, on 07/24/13 was 837.4.Weight on d/c was 174 lbs and net neg 28.5L  diuresed since admission. Heart failure team follow due to patients tenuous volume status and placed pt on lasix gtt and metolazone. The she was transitioned to torsemide 80mg  BID po on d/c.   AKI --2/2 diuretics and hypovolema. Cr on admission 1.42, baseline around 0.80. Creatinine on d/c was 1.02.   Hypokalemia-- low during hospital course secondary to aggressive diuresis. She was repleted w/ KCl w/ fluids and oral K+ during her stay.    Discharge Vitals:   BP 104/56  Pulse 66  Temp(Src) 98 F (36.7 C) (Oral)  Resp 18  Ht 5\' 8"  (1.727 m)  Wt 174 lb 9.6 oz (79.198 kg)  BMI 26.55 kg/m2  SpO2 98%  Discharge Labs:  Results for orders placed during the hospital encounter of 08/28/13 (from the past 24 hour(s))  BASIC METABOLIC PANEL     Status: Abnormal   Collection Time    09/07/13  9:38 AM      Result Value Ref Range   Sodium 133 (*) 137 - 147 mEq/L   Potassium 3.4 (*) 3.7 - 5.3 mEq/L   Chloride 86 (*) 96 - 112 mEq/L   CO2 33 (*) 19 - 32 mEq/L   Glucose, Bld 134 (*) 70 - 99 mg/dL   BUN 32 (*) 6 - 23 mg/dL   Creatinine, Ser 1.02  0.50 - 1.10 mg/dL   Calcium 9.3  8.4 - 10.5 mg/dL   GFR calc non Af Amer 53 (*) >90 mL/min   GFR calc Af Amer 62 (*) >90 mL/min   Anion gap 14  5 - 15  CBC WITH DIFFERENTIAL     Status: Abnormal   Collection Time    09/07/13  9:38 AM      Result Value Ref Range   WBC 4.8  4.0 - 10.5 K/uL   RBC 2.61 (*) 3.87 - 5.11 MIL/uL   Hemoglobin 7.8 (*) 12.0 - 15.0 g/dL   HCT 25.0 (*) 36.0 - 46.0 %   MCV 95.8  78.0 - 100.0 fL   MCH 29.9  26.0 - 34.0 pg   MCHC 31.2  30.0 - 36.0 g/dL   RDW 15.7 (*) 11.5 - 15.5 %   Platelets 159  150 - 400 K/uL   Neutrophils Relative % 67  43 - 77 %   Neutro Abs 3.2  1.7 - 7.7 K/uL   Lymphocytes Relative 22  12 - 46 %   Lymphs Abs 1.1  0.7 - 4.0 K/uL   Monocytes Relative 9  3 - 12 %   Monocytes Absolute 0.4  0.1 - 1.0 K/uL   Eosinophils Relative 2  0 - 5 %   Eosinophils Absolute 0.1  0.0 - 0.7 K/uL   Basophils Relative  0  0 - 1 %   Basophils Absolute 0.0  0.0 - 0.1 K/uL    Signed: Julious Oka, MD 09/07/2013, 3:52 PM    Services Ordered on Discharge: none  Equipment Ordered on Discharge: none

## 2013-09-07 NOTE — Progress Notes (Signed)
Pt for d/c in a few minutes. Discussed ex at home (increasing walking daily), low sodium, daily wts. Pt unable to do CRPII due to high copay. Pt has fairly good understanding. [place], ACSM 1:42 PM 09/07/2013

## 2013-09-07 NOTE — Discharge Instructions (Signed)
Start taking torsemide 80mg  (4 tabs) twice a day starting tomorrow until you follow up with the Heart Failure clinic at which time your diuretic dose will be adjusted at that time. Stop taking lasix and metolazone until then as well.

## 2013-09-12 ENCOUNTER — Ambulatory Visit (HOSPITAL_COMMUNITY)
Admission: RE | Admit: 2013-09-12 | Discharge: 2013-09-12 | Disposition: A | Discharge status: Home / Self Care | Payer: Medicare Other | Source: Ambulatory Visit | Attending: Internal Medicine | Admitting: Internal Medicine

## 2013-09-12 ENCOUNTER — Encounter (HOSPITAL_COMMUNITY): Payer: Self-pay

## 2013-09-12 VITALS — BP 112/42 | HR 94 | Wt 169.4 lb

## 2013-09-12 DIAGNOSIS — D500 Iron deficiency anemia secondary to blood loss (chronic): Secondary | ICD-10-CM | POA: Insufficient documentation

## 2013-09-12 DIAGNOSIS — I509 Heart failure, unspecified: Secondary | ICD-10-CM | POA: Insufficient documentation

## 2013-09-12 DIAGNOSIS — E079 Disorder of thyroid, unspecified: Secondary | ICD-10-CM | POA: Insufficient documentation

## 2013-09-12 DIAGNOSIS — G589 Mononeuropathy, unspecified: Secondary | ICD-10-CM | POA: Insufficient documentation

## 2013-09-12 DIAGNOSIS — D649 Anemia, unspecified: Secondary | ICD-10-CM

## 2013-09-12 DIAGNOSIS — I5032 Chronic diastolic (congestive) heart failure: Secondary | ICD-10-CM | POA: Diagnosis not present

## 2013-09-12 LAB — CBC
HEMATOCRIT: 22.3 % — AB (ref 36.0–46.0)
Hemoglobin: 7 g/dL — ABNORMAL LOW (ref 12.0–15.0)
MCH: 29.4 pg (ref 26.0–34.0)
MCHC: 31.4 g/dL (ref 30.0–36.0)
MCV: 93.7 fL (ref 78.0–100.0)
Platelets: 197 10*3/uL (ref 150–400)
RBC: 2.38 MIL/uL — ABNORMAL LOW (ref 3.87–5.11)
RDW: 15.6 % — AB (ref 11.5–15.5)
WBC: 4 10*3/uL (ref 4.0–10.5)

## 2013-09-12 LAB — BASIC METABOLIC PANEL
ANION GAP: 14 (ref 5–15)
BUN: 35 mg/dL — AB (ref 6–23)
CALCIUM: 8.7 mg/dL (ref 8.4–10.5)
CHLORIDE: 86 meq/L — AB (ref 96–112)
CO2: 33 mEq/L — ABNORMAL HIGH (ref 19–32)
CREATININE: 0.82 mg/dL (ref 0.50–1.10)
GFR calc Af Amer: 80 mL/min — ABNORMAL LOW (ref >90)
GFR calc non Af Amer: 69 mL/min — ABNORMAL LOW (ref >90)
Glucose, Bld: 103 mg/dL — ABNORMAL HIGH (ref 70–99)
Potassium: 2.5 mEq/L — CL (ref 3.7–5.3)
Sodium: 133 mEq/L — ABNORMAL LOW (ref 137–147)

## 2013-09-12 NOTE — Addendum Note (Signed)
Encounter addended by: Scarlette Calico, RN on: 09/12/2013  3:14 PM<BR>     Documentation filed: Patient Instructions Section, Orders

## 2013-09-12 NOTE — Patient Instructions (Signed)
Labs today  Your physician recommends that you schedule a follow-up appointment in: 4 weeks   

## 2013-09-12 NOTE — Progress Notes (Signed)
Patient ID: Nicole Hansen, female   DOB: [birth year], 73 y.o.   MRN: [id number] Primary Cardiologist: Dr Wyline Copas PCP: Dr Bebe Shaggy GI: [facility]  HPI: Ms Nicole Hansen is a 73 yo female with a history of AI s/p AVR (bovine), hypothyroidism s/p thyroidectomy, HTN, obesity, diastolic HF and recurrent anemia.   Admitted to [facility] June 1st  through June 18th with volume overload with lasix drip and metolazone. Diuresed 42 pounds and transitioned to torsemide 40 mg daily. Discharge weight was 173 pounds.  Readmitted in July 2015 for recurrent HF and recurrent diverticular bleeding. Diuresed. Transfused 5u PRBCs. Was placed on sorbitol to help soften stools. Has had negative EGD and colon recently. Rending capsule endo. Weight on d/c was 174.  She returns for follow up. Taking torsemide 80 bid. Weight continues to come down. Now 169.  Denies SOB/PND. Sleeps on 2 pillows but this is her habit.  Weight at home 161 pounds. Taking all medications. Watching fluid and salt intake at home.  No dizziness. No further GI bleeding  Labs 07/30/13 K 5.1 Creatine 0.87 Labs 07/26/13 K 4.2 Creatinine 0.87 Labs 09/07/13 K 3.4 Cr 1.02 Hgb 7.8  FH: Mother deceased: HTN, AAA  Father deceased: HTN, CHF  SH: Lives with husband in [place], No ETOH or tobacco abuse.  ROS: All systems negative except as listed in HPI, PMH and Problem List.  Past Medical History  Diagnosis Date  . CHF (congestive heart failure)   . Thyroid disease   . Neuropathy     Current Outpatient Prescriptions  Medication Sig Dispense Refill  . acetaminophen (TYLENOL) 325 MG tablet Take 650 mg by mouth every 6 (six) hours as needed (pain).       . ferrous sulfate 325 (65 FE) MG tablet Take 325 mg by mouth 3 (three) times daily with meals.       . gabapentin (NEURONTIN) 300 MG capsule Take 300 mg by mouth 2 (two) times daily.       Marland Kitchen levothyroxine (SYNTHROID, LEVOTHROID) 200 MCG tablet Take 1 tablet (200 mcg total) by mouth daily before breakfast.  30 tablet  1  .  LORazepam (ATIVAN) 1 MG tablet Take 1 mg by mouth at bedtime as needed for anxiety or sleep. sleep      . potassium chloride SA (K-DUR,KLOR-CON) 20 MEQ tablet Take 20 mEq by mouth 3 (three) times daily.       . sorbitol 70 % SOLN Take 15 mLs by mouth 2 (two) times daily as needed for moderate constipation.  500 mL  1  . spironolactone (ALDACTONE) 25 MG tablet Take 1 tablet (25 mg total) by mouth daily.  30 tablet  6  . torsemide (DEMADEX) 20 MG tablet Take 4 tablets (80 mg total) by mouth 2 (two) times daily.  48 tablet  0   No current facility-administered medications for this encounter.     PHYSICAL EXAM: Filed Vitals:   09/12/13 1431  BP: 112/42  Pulse: 94  Weight: 169 lb 6.4 oz (76.839 kg)  SpO2: 97%    General:  Well appearing. No resp difficulty Daughter present  HEENT: normal Neck: supple. JVP 7-8 + prominent CV waves  . Carotids 2+ bilaterally; no bruits. No lymphadenopathy or thryomegaly appreciated. Cor: PMI normal. Regular rate & rhythm. No rubs, gallops 2/6 SEM RUSB Lungs: clear Abdomen: soft, nontender, nondistended. No hepatosplenomegaly. No bruits or masses. Good bowel sounds. Extremities: no cyanosis, clubbing, rash, R and LLE 2+  Woody edema to knees  Neuro: alert &  orientedx3, cranial nerves grossly intact. Moves all 4 extremities w/o difficulty. Affect pleasant.   ASSESSMENT & PLAN: 1. Chronic Diastolic HF.  R>L --Volume status continues to improve. Will continue current torsemide dosing --Check BMET today --Will order stocking donner to help use compression hose -- Reinforced daily weight, low salt food choices, and limiting fluid intake to < 2 liters per day.  -- Follow up in 1 month  2. Anemia d/t GI bleeding 00 no overt bleeding --Check CBC today and f/u with GI  F/u 1 month  Glori Bickers MD  3:02 PM

## 2013-09-17 ENCOUNTER — Inpatient Hospital Stay (HOSPITAL_COMMUNITY): Payer: Medicare Other

## 2013-09-17 ENCOUNTER — Encounter (HOSPITAL_COMMUNITY): Payer: Self-pay | Admitting: Emergency Medicine

## 2013-09-17 ENCOUNTER — Emergency Department (HOSPITAL_COMMUNITY): Payer: Medicare Other

## 2013-09-17 ENCOUNTER — Inpatient Hospital Stay (HOSPITAL_COMMUNITY)
Admission: EM | Admit: 2013-09-17 | Discharge: 2013-09-21 | DRG: 377 | Disposition: A | Discharge status: Home / Self Care | Payer: Medicare Other | Attending: Internal Medicine | Admitting: Internal Medicine

## 2013-09-17 DIAGNOSIS — E872 Acidosis, unspecified: Secondary | ICD-10-CM | POA: Diagnosis not present

## 2013-09-17 DIAGNOSIS — Z880 Allergy status to penicillin: Secondary | ICD-10-CM

## 2013-09-17 DIAGNOSIS — Z885 Allergy status to narcotic agent status: Secondary | ICD-10-CM | POA: Diagnosis not present

## 2013-09-17 DIAGNOSIS — K209 Esophagitis, unspecified without bleeding: Secondary | ICD-10-CM | POA: Diagnosis present

## 2013-09-17 DIAGNOSIS — I5032 Chronic diastolic (congestive) heart failure: Secondary | ICD-10-CM

## 2013-09-17 DIAGNOSIS — E876 Hypokalemia: Secondary | ICD-10-CM

## 2013-09-17 DIAGNOSIS — G589 Mononeuropathy, unspecified: Secondary | ICD-10-CM | POA: Diagnosis present

## 2013-09-17 DIAGNOSIS — R42 Dizziness and giddiness: Secondary | ICD-10-CM | POA: Diagnosis present

## 2013-09-17 DIAGNOSIS — N179 Acute kidney failure, unspecified: Secondary | ICD-10-CM | POA: Diagnosis present

## 2013-09-17 DIAGNOSIS — I509 Heart failure, unspecified: Secondary | ICD-10-CM | POA: Diagnosis present

## 2013-09-17 DIAGNOSIS — D62 Acute posthemorrhagic anemia: Secondary | ICD-10-CM

## 2013-09-17 DIAGNOSIS — Z952 Presence of prosthetic heart valve: Secondary | ICD-10-CM

## 2013-09-17 DIAGNOSIS — E871 Hypo-osmolality and hyponatremia: Secondary | ICD-10-CM | POA: Diagnosis present

## 2013-09-17 DIAGNOSIS — D131 Benign neoplasm of stomach: Secondary | ICD-10-CM | POA: Diagnosis present

## 2013-09-17 DIAGNOSIS — I959 Hypotension, unspecified: Secondary | ICD-10-CM

## 2013-09-17 DIAGNOSIS — R531 Weakness: Secondary | ICD-10-CM

## 2013-09-17 DIAGNOSIS — K921 Melena: Secondary | ICD-10-CM | POA: Diagnosis present

## 2013-09-17 DIAGNOSIS — K2990 Gastroduodenitis, unspecified, without bleeding: Secondary | ICD-10-CM

## 2013-09-17 DIAGNOSIS — I5033 Acute on chronic diastolic (congestive) heart failure: Secondary | ICD-10-CM | POA: Diagnosis present

## 2013-09-17 DIAGNOSIS — Z87891 Personal history of nicotine dependence: Secondary | ICD-10-CM | POA: Diagnosis not present

## 2013-09-17 DIAGNOSIS — K922 Gastrointestinal hemorrhage, unspecified: Secondary | ICD-10-CM

## 2013-09-17 DIAGNOSIS — K2970 Gastritis, unspecified, without bleeding: Secondary | ICD-10-CM | POA: Diagnosis present

## 2013-09-17 DIAGNOSIS — E8729 Other acidosis: Secondary | ICD-10-CM | POA: Diagnosis present

## 2013-09-17 HISTORY — DX: Diverticulosis of intestine, part unspecified, without perforation or abscess without bleeding: K57.90

## 2013-09-17 LAB — BASIC METABOLIC PANEL
Anion gap: 17 — ABNORMAL HIGH (ref 5–15)
BUN: 43 mg/dL — AB (ref 6–23)
CHLORIDE: 84 meq/L — AB (ref 96–112)
CO2: 28 meq/L (ref 19–32)
CREATININE: 0.93 mg/dL (ref 0.50–1.10)
Calcium: 8.7 mg/dL (ref 8.4–10.5)
GFR calc Af Amer: 69 mL/min — ABNORMAL LOW (ref >90)
GFR calc non Af Amer: 60 mL/min — ABNORMAL LOW (ref >90)
Glucose, Bld: 103 mg/dL — ABNORMAL HIGH (ref 70–99)
Potassium: 3.2 mEq/L — ABNORMAL LOW (ref 3.7–5.3)
Sodium: 129 mEq/L — ABNORMAL LOW (ref 137–147)

## 2013-09-17 LAB — CBC WITH DIFFERENTIAL/PLATELET
BASOS PCT: 0 % (ref 0–1)
Basophils Absolute: 0 10*3/uL (ref 0.0–0.1)
EOS ABS: 0 10*3/uL (ref 0.0–0.7)
EOS PCT: 1 % (ref 0–5)
HEMATOCRIT: 15.5 % — AB (ref 36.0–46.0)
HEMOGLOBIN: 4.8 g/dL — AB (ref 12.0–15.0)
Lymphocytes Relative: 23 % (ref 12–46)
Lymphs Abs: 1.2 10*3/uL (ref 0.7–4.0)
MCH: 27.7 pg (ref 26.0–34.0)
MCHC: 31 g/dL (ref 30.0–36.0)
MCV: 89.6 fL (ref 78.0–100.0)
MONO ABS: 0.7 10*3/uL (ref 0.1–1.0)
MONOS PCT: 13 % — AB (ref 3–12)
Neutro Abs: 3.4 10*3/uL (ref 1.7–7.7)
Neutrophils Relative %: 63 % (ref 43–77)
Platelets: 216 10*3/uL (ref 150–400)
RBC: 1.73 MIL/uL — ABNORMAL LOW (ref 3.87–5.11)
RDW: 16.8 % — ABNORMAL HIGH (ref 11.5–15.5)
WBC: 5.4 10*3/uL (ref 4.0–10.5)

## 2013-09-17 LAB — URINALYSIS, ROUTINE W REFLEX MICROSCOPIC
BILIRUBIN URINE: NEGATIVE
Glucose, UA: NEGATIVE mg/dL
HGB URINE DIPSTICK: NEGATIVE
KETONES UR: NEGATIVE mg/dL
Nitrite: NEGATIVE
PROTEIN: NEGATIVE mg/dL
Specific Gravity, Urine: 1.012 (ref 1.005–1.030)
UROBILINOGEN UA: 4 mg/dL — AB (ref 0.0–1.0)
pH: 6 (ref 5.0–8.0)

## 2013-09-17 LAB — POC OCCULT BLOOD, ED: Fecal Occult Bld: POSITIVE — AB

## 2013-09-17 LAB — URINE MICROSCOPIC-ADD ON

## 2013-09-17 LAB — MRSA PCR SCREENING: MRSA by PCR: NEGATIVE

## 2013-09-17 LAB — PREPARE RBC (CROSSMATCH)

## 2013-09-17 LAB — TROPONIN I: Troponin I: <0.3 ng/mL (ref <0.30)

## 2013-09-17 LAB — PRO B NATRIURETIC PEPTIDE: Pro B Natriuretic peptide (BNP): 3720 pg/mL — ABNORMAL HIGH (ref 0–125)

## 2013-09-17 MED ORDER — SODIUM CHLORIDE 0.9 % IV BOLUS (SEPSIS)
500.0000 mL | Freq: Once | INTRAVENOUS | Status: AC
  Administered 2013-09-17: 500 mL via INTRAVENOUS

## 2013-09-17 MED ORDER — PANTOPRAZOLE SODIUM 40 MG IV SOLR
40.0000 mg | Freq: Once | INTRAVENOUS | Status: AC
  Administered 2013-09-17: 40 mg via INTRAVENOUS
  Filled 2013-09-17: qty 40

## 2013-09-17 MED ORDER — SODIUM CHLORIDE 0.9 % IJ SOLN
3.0000 mL | Freq: Two times a day (BID) | INTRAMUSCULAR | Status: DC
  Administered 2013-09-17 – 2013-09-21 (×8): 3 mL via INTRAVENOUS

## 2013-09-17 MED ORDER — PANTOPRAZOLE SODIUM 40 MG IV SOLR
40.0000 mg | Freq: Two times a day (BID) | INTRAVENOUS | Status: DC
  Administered 2013-09-17 – 2013-09-21 (×8): 40 mg via INTRAVENOUS
  Filled 2013-09-17 (×10): qty 40

## 2013-09-17 MED ORDER — GABAPENTIN 300 MG PO CAPS
300.0000 mg | ORAL_CAPSULE | Freq: Every day | ORAL | Status: DC
  Administered 2013-09-17 – 2013-09-20 (×4): 300 mg via ORAL
  Filled 2013-09-17 (×5): qty 1

## 2013-09-17 MED ORDER — POTASSIUM CHLORIDE 10 MEQ/100ML IV SOLN
10.0000 meq | Freq: Once | INTRAVENOUS | Status: AC
  Administered 2013-09-17: 10 meq via INTRAVENOUS
  Filled 2013-09-17: qty 100

## 2013-09-17 NOTE — Consult Note (Signed)
Unassigned patient Reason for Consult: GI bleed.  Referring Physician: ER-MD  Nicole Hansen is an 73 y.o. female.  HPI: 73 year old white female, with multiple medical issues listed below, hospitalized recently for similar issues and received  5 units of PRBC's, presents to the ED today with weakness and dark tarry stools for the last 2 days with severe fatigue and weakness. She was hypotensive initially but her BP improved after she received some fluids and i unit of PRBC's. She is followed by Dr. Dorrene German in [facility] and has had a normal EGD [except for gastric polyps] and a colonoscopy revealing diverticulosis in May 2015. She was found to have a hemoglobin off 4.8 gm/dl on admission and is being transfused at the present time. She was supposed to have a capsule study done by her primary GI in the near future to work her up and to find the source of her GI blood loss.  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 Patient claims she started having problems with rectal bleeding on 04/08/2013. She denies the use or abuse of NSAIDS.    Past Medical History  Diagnosis Date  . CHF (congestive heart failure)   . Thyroid disease   . Neuropathy   . Diverticular disease     AVR for AI-5 years ago       Hypertension Past Surgical History  Procedure Laterality Date  . Tee without cardioversion N/A 08/30/2013    Procedure: TRANSESOPHAGEAL ECHOCARDIOGRAM (TEE);  Surgeon: Larey Dresser, MD;  Location: [place];  Service: Cardiovascular;  Laterality: N/A;   No family history on file.  Social History:  reports that she has quit smoking. She quit smokeless tobacco use about 29 years ago. She reports that she does not drink alcohol or use illicit drugs.  Allergies:  Allergies  Allergen Reactions  . Oxycodone Itching    Severe diffuse itching  . Penicillins Rash   Medications: I have reviewed the patient's current medications.  Results for orders placed during the hospital  encounter of 09/17/13 (from the past 48 hour(s))  TROPONIN I     Status: None   Collection Time    09/17/13 12:59 PM      Result Value Ref Range   Troponin I <0.30  <0.30 ng/mL   Comment:            Due to the release kinetics of cTnI,     a negative result within the first hours     of the onset of symptoms does not rule out     myocardial infarction with certainty.     If myocardial infarction is still suspected,     repeat the test at appropriate intervals.  PRO B NATRIURETIC PEPTIDE     Status: Abnormal   Collection Time    09/17/13 12:59 PM      Result Value Ref Range   Pro B Natriuretic peptide (BNP) 3720.0 (*) 0 - 125 pg/mL  BASIC METABOLIC PANEL     Status: Abnormal   Collection Time    09/17/13 12:59 PM      Result Value Ref Range   Sodium 129 (*) 137 - 147 mEq/L   Potassium 3.2 (*) 3.7 - 5.3 mEq/L   Chloride 84 (*) 96 - 112 mEq/L   CO2 28  19 - 32 mEq/L   Glucose, Bld 103 (*) 70 - 99 mg/dL   BUN 43 (*) 6 - 23 mg/dL  Creatinine, Ser 0.93  0.50 - 1.10 mg/dL   Calcium 8.7  8.4 - 10.5 mg/dL   GFR calc non Af Amer 60 (*) >90 mL/min   GFR calc Af Amer 69 (*) >90 mL/min   Comment: (NOTE)     The eGFR has been calculated using the CKD EPI equation.     This calculation has not been validated in all clinical situations.     eGFR's persistently <90 mL/min signify possible Chronic Kidney     Disease.   Anion gap 17 (*) 5 - 15  CBC WITH DIFFERENTIAL     Status: Abnormal   Collection Time    09/17/13 12:59 PM      Result Value Ref Range   WBC 5.4  4.0 - 10.5 K/uL   RBC 1.73 (*) 3.87 - 5.11 MIL/uL   Hemoglobin 4.8 (*) 12.0 - 15.0 g/dL   Comment: REPEATED TO VERIFY     CRITICAL RESULT CALLED TO, READ BACK BY AND VERIFIED WITH:     C GROSE,RN AT 1404 09/17/13 BY K BARR   HCT 15.5 (*) 36.0 - 46.0 %   MCV 89.6  78.0 - 100.0 fL   MCH 27.7  26.0 - 34.0 pg   MCHC 31.0  30.0 - 36.0 g/dL   RDW 16.8 (*) 11.5 - 15.5 %   Platelets 216  150 - 400 K/uL   Neutrophils Relative % 63   43 - 77 %   Neutro Abs 3.4  1.7 - 7.7 K/uL   Lymphocytes Relative 23  12 - 46 %   Lymphs Abs 1.2  0.7 - 4.0 K/uL   Monocytes Relative 13 (*) 3 - 12 %   Monocytes Absolute 0.7  0.1 - 1.0 K/uL   Eosinophils Relative 1  0 - 5 %   Eosinophils Absolute 0.0  0.0 - 0.7 K/uL   Basophils Relative 0  0 - 1 %   Basophils Absolute 0.0  0.0 - 0.1 K/uL  POC OCCULT BLOOD, ED     Status: Abnormal   Collection Time    09/17/13  1:15 PM      Result Value Ref Range   Fecal Occult Bld POSITIVE (*) NEGATIVE  URINALYSIS, ROUTINE W REFLEX MICROSCOPIC     Status: Abnormal   Collection Time    09/17/13  1:51 PM      Result Value Ref Range   Color, Urine YELLOW  YELLOW   APPearance HAZY (*) CLEAR   Specific Gravity, Urine 1.012  1.005 - 1.030   pH 6.0  5.0 - 8.0   Glucose, UA NEGATIVE  NEGATIVE mg/dL   Hgb urine dipstick NEGATIVE  NEGATIVE   Bilirubin Urine NEGATIVE  NEGATIVE   Ketones, ur NEGATIVE  NEGATIVE mg/dL   Protein, ur NEGATIVE  NEGATIVE mg/dL   Urobilinogen, UA 4.0 (*) 0.0 - 1.0 mg/dL   Nitrite NEGATIVE  NEGATIVE   Leukocytes, UA TRACE (*) NEGATIVE  URINE MICROSCOPIC-ADD ON     Status: Abnormal   Collection Time    09/17/13  1:51 PM      Result Value Ref Range   Squamous Epithelial / LPF FEW (*) RARE   WBC, UA 0-2  <3 WBC/hpf   Bacteria, UA MANY (*) RARE  TYPE AND SCREEN     Status: None   Collection Time    09/17/13  2:05 PM      Result Value Ref Range   ABO/RH(D) O POS     Antibody Screen NEG  Sample Expiration 09/20/2013     Unit Number [id number]     Blood Component Type RBC LR PHER2     Unit division 00     Status of Unit ISSUED     Transfusion Status OK TO TRANSFUSE     Crossmatch Result Compatible     Unit Number [id number]     Blood Component Type RED CELLS,LR     Unit division 00     Status of Unit ALLOCATED     Transfusion Status OK TO TRANSFUSE     Crossmatch Result Compatible    PREPARE RBC (CROSSMATCH)     Status: None   Collection Time    09/17/13   2:05 PM      Result Value Ref Range   Order Confirmation ORDER PROCESSED BY BLOOD BANK     Ct Head Wo Contrast  09/17/2013   CLINICAL DATA:  Dizziness and weakness.  EXAM: CT HEAD WITHOUT CONTRAST  TECHNIQUE: Contiguous axial images were obtained from the base of the skull through the vertex without intravenous contrast.  COMPARISON:  05/17/2013  FINDINGS: Ventricles, cisterns and other CSF spaces are within normal. There is no mass, mass effect, shift of midline structures or acute hemorrhage. No evidence of acute infarction. Remaining bones and soft tissues are within normal.  IMPRESSION: No acute intracranial findings.   Electronically Signed   By: Marin Olp M.D.   On: 09/17/2013 13:52   Dg Chest Port 1 View  09/17/2013   CLINICAL DATA:  One-day history of shortness of breath with a history of CHF and anemia  EXAM: PORTABLE CHEST - 1 VIEW  COMPARISON:  PA and lateral chest x-ray of August 28, 2013  FINDINGS: The lungs are adequately inflated. There is no focal infiltrate. The interstitial markings are mildly increased. The costophrenic angles remain sharp. The cardiac silhouette is mildly enlarged and the central pulmonary vascularity is engorged. The patient has undergone previous CABG. There are mild degenerative changes of the left shoulder.  IMPRESSION: Congestive heart failure with mild pulmonary interstitial edema. The pleural effusion on the right has cleared since the study of August 28, 2013.   Electronically Signed   By: David  Martinique   On: 09/17/2013 17:56   Review of Systems  Constitutional: Positive for chills, malaise/fatigue and diaphoresis.  Cardiovascular: Positive for leg swelling.  Gastrointestinal: Positive for blood in stool and melena.  Neurological: Positive for weakness.  Psychiatric/Behavioral: The patient is nervous/anxious.    Blood pressure 104/57, pulse 92, temperature 98.2 F (36.8 C), temperature source Oral, resp. rate 13, height $RemoveBe'5\' 8"'MJpExMHoi$  (1.727 m), weight 83.8 kg  (184 lb 11.9 oz), SpO2 96.00%. Physical Exam  Constitutional: She is oriented to person, place, and time. She appears well-developed and well-nourished.  HENT:  Head: Normocephalic and atraumatic.  Eyes: Conjunctivae and EOM are normal. Pupils are equal, round, and reactive to light.  Neck: Normal range of motion. Neck supple.  Cardiovascular: Normal rate and regular rhythm.   Respiratory: Effort normal and breath sounds normal.  GI: Soft. Bowel sounds are normal.  Musculoskeletal: Normal range of motion.  Neurological: She is alert and oriented to person, place, and time.  Skin: Skin is warm and dry.  Psychiatric: She has a normal mood and affect. Her behavior is normal. Judgment and thought content normal.   Assessment/Plan: 1) Severe anemia due to recurrent GI blood loss; will plan to do a capsule study tomorrow. Transfuse 4 units of PRBC's slowly; continue serial CBC's. Elevated BUN ?  upper GI tract bleed. 2) Colonic diverticulosis/gastric polyps.  3) Chronic diastolic heart failure.  4) Hypokalemia-replete.   Nicole Hansen 09/17/2013, 7:12 PM

## 2013-09-17 NOTE — ED Notes (Signed)
Pt transported to [place] via Biomedical scientist on tele by [employer]

## 2013-09-17 NOTE — H&P (Signed)
Date: 09/17/2013               Patient Name:  Nicole Hansen MRN: [id number]  DOB: [birth year] Age / Sex: 73 y.o., female   PCP: Rubie Maid, MD         Medical Service: Internal Medicine Teaching Service         Attending Physician: Dr. [name], MD    First Contact: Dr. Genene Churn Pager: [phone]  Second Contact: Dr. Hayes Ludwig Pager: [phone]       After Hours (After 5p/  First Contact Pager: [phone]  weekends / holidays): Second Contact Pager: [phone]   Chief Complaint: dizziness and fatigue  History of Present Illness:   73 yo with Diastolic HF, AI s/p AVR, recent diverticular bleed who rpesents with 2 days of general fatigue and malaise. She hasn't been doing well since Saturday and it progressively worsened until today. She could barely talk to her family. She felt tired and weak. She noticed black stool for 2 days (dark black). No hematochezia, no hempytisis. Denies abdominal pain. Denies SOB/chest pain. Has been feeling dizzy. Her admission Hgb was 4.8 and SBP 70. PCCM as called. She received 1 unit blood and BP raised to 90's. Her mental status improved significantly and she felt like she was getting close to her baseline. Still feels tired but is more alert now and feels better. Denies fever. Has some dry cough.   Was supposed to get capsule EGD outpatient on 09/21/13.   Meds: Current Facility-Administered Medications  Medication Dose Route Frequency Provider Last Rate Last Dose  . sodium chloride 0.9 % injection 3 mL  3 mL Intravenous Q12H Wilber Oliphant, MD        Allergies: Allergies as of 09/17/2013 - Review Complete 09/17/2013  Allergen Reaction Noted  . Oxycodone Itching 08/30/2013  . Penicillins Rash 07/09/2013   Past Medical History  Diagnosis Date  . CHF (congestive heart failure)   . Thyroid disease   . Neuropathy   . Diverticular disease    Past Surgical History  Procedure Laterality Date  . Tee without cardioversion N/A 08/30/2013    Procedure:  TRANSESOPHAGEAL ECHOCARDIOGRAM (TEE);  Surgeon: Larey Dresser, MD;  Location: [place];  Service: Cardiovascular;  Laterality: N/A;   No family history on file. History   Social History  . Marital Status: Married    Spouse Name: N/A    Number of Children: N/A  . Years of Education: N/A   Occupational History  . Not on file.   Social History Main Topics  . Smoking status: Former Research scientist (life sciences)  . Smokeless tobacco: Former Systems developer    Quit date: 07/10/1984  . Alcohol Use: No  . Drug Use: No  . Sexual Activity: Not on file   Other Topics Concern  . Not on file   Social History Narrative   Lives with Husband in [place].     Review of Systems: Review of Systems  Constitutional: Positive for chills and malaise/fatigue. Negative for fever.  HENT: Negative for ear pain, nosebleeds, sore throat and tinnitus.   Eyes: Negative for blurred vision, double vision, photophobia, pain, discharge and redness.  Respiratory: Positive for cough. Negative for hemoptysis, sputum production, shortness of breath, wheezing and stridor.   Cardiovascular: Positive for leg swelling. Negative for chest pain, palpitations, orthopnea, claudication and PND.  Gastrointestinal: Positive for melena. Negative for heartburn, nausea, vomiting, abdominal pain, diarrhea, constipation and blood in stool.  Genitourinary: Negative.   Musculoskeletal: Negative.  Skin: Negative.   Neurological: Positive for weakness. Negative for dizziness, tingling, tremors, sensory change, speech change, focal weakness, seizures, loss of consciousness and headaches.  Endo/Heme/Allergies: Negative.   Psychiatric/Behavioral: Negative.      Physical Exam: Blood pressure 104/57, pulse 92, temperature 98.2 F (36.8 C), temperature source Oral, resp. rate 13, height 5\' 8"  (1.727 m), weight 83.8 kg (184 lb 11.9 oz), SpO2 96.00%. Physical Exam  Constitutional: She is oriented to person, place, and time. She appears well-developed. No  distress.  HENT:  Head: Normocephalic and atraumatic.  Right Ear: External ear normal.  Left Ear: External ear normal.  Eyes: Conjunctivae and EOM are normal. Pupils are equal, round, and reactive to light. Right eye exhibits no discharge. Left eye exhibits no discharge.  +pallor  Neck: Normal range of motion. Neck supple. JVD present.  Cardiovascular: Normal rate.   Murmur heard. Systolic ejection murmur present. JVD ~10 cm present.  Respiratory: Effort normal and breath sounds normal. No respiratory distress. She has no wheezes. She has no rales. She exhibits no tenderness.  GI: Soft. Bowel sounds are normal. She exhibits no distension and no mass. There is no tenderness. There is no rebound and no guarding.  Musculoskeletal: Normal range of motion. She exhibits no edema and no tenderness.  2+ Pitting edema upto her shins.  Neurological: She is alert and oriented to person, place, and time. No cranial nerve deficit. Coordination normal.  Skin: Skin is warm. She is not diaphoretic.  Psychiatric: She has a normal mood and affect.     Lab results: Basic Metabolic Panel:  Recent Labs  09/17/13 1259  NA 129*  K 3.2*  CL 84*  CO2 28  GLUCOSE 103*  BUN 43*  CREATININE 0.93  CALCIUM 8.7   Liver Function Tests: No results found for this basename: AST, ALT, ALKPHOS, BILITOT, PROT, ALBUMIN,  in the last 72 hours No results found for this basename: LIPASE, AMYLASE,  in the last 72 hours No results found for this basename: AMMONIA,  in the last 72 hours CBC:  Recent Labs  09/17/13 1259  WBC 5.4  NEUTROABS 3.4  HGB 4.8*  HCT 15.5*  MCV 89.6  PLT 216   Cardiac Enzymes:  Recent Labs  09/17/13 1259  TROPONINI <0.30   BNP:  Recent Labs  09/17/13 1259  PROBNP 3720.0*   No results found for this basename: ETH,  in the last 72 hours Urinalysis:  Recent Labs  09/17/13 1351  COLORURINE YELLOW  LABSPEC 1.012  PHURINE 6.0  GLUCOSEU NEGATIVE  HGBUR NEGATIVE    BILIRUBINUR NEGATIVE  KETONESUR NEGATIVE  PROTEINUR NEGATIVE  UROBILINOGEN 4.0*  NITRITE NEGATIVE  LEUKOCYTESUR TRACE*    Imaging results:  Ct Head Wo Contrast  09/17/2013   CLINICAL DATA:  Dizziness and weakness.  EXAM: CT HEAD WITHOUT CONTRAST  TECHNIQUE: Contiguous axial images were obtained from the base of the skull through the vertex without intravenous contrast.  COMPARISON:  05/17/2013  FINDINGS: Ventricles, cisterns and other CSF spaces are within normal. There is no mass, mass effect, shift of midline structures or acute hemorrhage. No evidence of acute infarction. Remaining bones and soft tissues are within normal.  IMPRESSION: No acute intracranial findings.   Electronically Signed   By: Marin Olp M.D.   On: 09/17/2013 13:52   Dg Chest Port 1 View  09/17/2013   CLINICAL DATA:  One-day history of shortness of breath with a history of CHF and anemia  EXAM: PORTABLE CHEST - 1  VIEW  COMPARISON:  PA and lateral chest x-ray of August 28, 2013  FINDINGS: The lungs are adequately inflated. There is no focal infiltrate. The interstitial markings are mildly increased. The costophrenic angles remain sharp. The cardiac silhouette is mildly enlarged and the central pulmonary vascularity is engorged. The patient has undergone previous CABG. There are mild degenerative changes of the left shoulder.  IMPRESSION: Congestive heart failure with mild pulmonary interstitial edema. The pleural effusion on the right has cleared since the study of August 28, 2013.   Electronically Signed   By: David  Martinique   On: 09/17/2013 17:56    Other results: EKG: LBBB.  Assessment & Plan by Problem: Active Problems:   GI bleed   Acute blood loss anemia  Acute blood loss anemia likely 2/2 to GI Bleed  Patient having melena. With hx of diverticular bleed, this is likely recurrence of that.  - GI consulted, f/up GI recs.  - doing better with 1 unit blood. will check CBC after 2nd unit and if symptomatic or  +orthostatic will transfuse the 3rd unit. transufion rate is slow because of CHF. She is currently relatively dry given her weight al though CXR shows some mild volume overload.  - can diurese in between the blood transfusions if becomes volume overloaded - check orthostatic after 2nd unit. Check CBC at midnight after 1st unit. - replete K+.  - NPO for now until GI recs. - may consider IV iron to help stablize anemia. - cycle trop. Repeat EKG in AM  Diastolic CHF- ECHO TEE 5/63/14 EF 60-65% currently clinically euvolemic. Might become volume overloaded after transfusion - lasix PRN post transfusion if symptomatic from volume overload. - consult heart failure group tomorrow. - daily weights, I/O.   Hypokalemia  - replete as needed. Should improve with transfusion. - will switch to spironolactone for diuresis once can have PO.  PPX: SCD. Diet: NPO for now until GI sees her. Code: full  Dispo: Disposition is deferred at this time, awaiting improvement of current medical problems. Anticipated discharge in approximately 2-3 day(s).   The patient does have a current PCP Rubie Maid, MD) and does need an [facility] hospital follow-up appointment after discharge.  The patient does not know have transportation limitations that hinder transportation to clinic appointments.  Signed: Dellia Nims, MD 09/17/2013, 6:47 PM

## 2013-09-17 NOTE — Progress Notes (Addendum)
Pt admitted to [place] from ED. Oriented to unit and room. Safety discussed and call bell with in reach. VSS. RBC are currently being infused.  Erick Blinks, RN

## 2013-09-17 NOTE — ED Notes (Signed)
Report given ,waiting for XR

## 2013-09-17 NOTE — Consult Note (Signed)
Name: Nicole Hansen MRN: [id number] DOB: 10/25/1940    ADMISSION DATE:  09/17/2013 CONSULTATION DATE:  09/17/2013  REFERRING MD :  EDP PRIMARY SERVICE:  TRH  CHIEF COMPLAINT:  Weakness and dizziness.  BRIEF PATIENT DESCRIPTION: 73 year old female with recent admission 7/17 in [facility] with diverticular bleeding and a negative EGD who presents to the hospital with dizziness and weakness.  Two days of melena but no BMs today yet.  Upon arriving to the ED was noted to have a Hg of 4.8 and SBP of 70.  PCCM was called to admit.  Upon arrival patient had had the first unit of blood and BP was already 80-90 without tachycardia and patient reported feeling much better.  Sitting up in bed, speaking in full sentences.  SIGNIFICANT EVENTS / STUDIES:  8/10 admission for GI bleeding.  LINES / TUBES: PIV  CULTURES: None  ANTIBIOTICS: None  PAST MEDICAL HISTORY :  Past Medical History  Diagnosis Date  . CHF (congestive heart failure)   . Thyroid disease   . Neuropathy   . Diverticular disease    Past Surgical History  Procedure Laterality Date  . Tee without cardioversion N/A 08/30/2013    Procedure: TRANSESOPHAGEAL ECHOCARDIOGRAM (TEE);  Surgeon: Larey Dresser, MD;  Location: [place];  Service: Cardiovascular;  Laterality: N/A;   Prior to Admission medications   Medication Sig Start Date End Date Taking? Authorizing Provider  acetaminophen (TYLENOL) 325 MG tablet Take 650 mg by mouth every 6 (six) hours as needed (pain).    Yes Historical Provider, MD  ferrous sulfate 325 (65 FE) MG tablet Take 325 mg by mouth 3 (three) times daily with meals.    Yes Historical Provider, MD  gabapentin (NEURONTIN) 300 MG capsule Take 300 mg by mouth at bedtime.  05/31/13  Yes Historical Provider, MD  levothyroxine (SYNTHROID, LEVOTHROID) 200 MCG tablet Take 1 tablet (200 mcg total) by mouth daily before breakfast. 07/26/13  Yes Barton Dubois, MD  LORazepam (ATIVAN) 1 MG tablet Take 1 mg by  mouth at bedtime as needed for anxiety or sleep. sleep 06/01/13  Yes Historical Provider, MD  potassium chloride SA (K-DUR,KLOR-CON) 20 MEQ tablet Take 20 mEq by mouth 3 (three) times daily.  07/02/13  Yes Historical Provider, MD  sorbitol 70 % SOLN Take 15 mLs by mouth 2 (two) times daily as needed for moderate constipation. 09/07/13  Yes Julious Oka, MD  spironolactone (ALDACTONE) 25 MG tablet Take 1 tablet (25 mg total) by mouth daily. 08/13/13  Yes Amy D Clegg, NP  torsemide (DEMADEX) 20 MG tablet Take 4 tablets (80 mg total) by mouth 2 (two) times daily. 09/07/13  Yes Julious Oka, MD   Allergies  Allergen Reactions  . Oxycodone Itching    Severe diffuse itching  . Penicillins Rash    FAMILY HISTORY:  No family history on file. SOCIAL HISTORY:  reports that she has quit smoking. She quit smokeless tobacco use about 29 years ago. She reports that she does not drink alcohol or use illicit drugs.  REVIEW OF SYSTEMS:   Constitutional: Negative for fever, chills, weight loss, malaise/fatigue and diaphoresis.  HENT: Negative for hearing loss, ear pain, nosebleeds, congestion, sore throat, neck pain, tinnitus and ear discharge.   Eyes: Negative for blurred vision, double vision, photophobia, pain, discharge and redness.  Respiratory: Negative for cough, hemoptysis, sputum production, shortness of breath, wheezing and stridor.   Cardiovascular: Negative for chest pain, palpitations, orthopnea, claudication, leg swelling and PND.  Gastrointestinal: Negative for heartburn, nausea, vomiting, abdominal pain, diarrhea, constipation, blood in stool but positive for melena.  Genitourinary: Negative for dysuria, urgency, frequency, hematuria and flank pain.  Musculoskeletal: Negative for myalgias, back pain, joint pain and falls.  Skin: Negative for itching and rash.  Neurological: Negative for dizziness, tingling, tremors, sensory change, speech change, focal weakness, seizures, loss of consciousness,  weakness and headaches.  Endo/Heme/Allergies: Negative for environmental allergies and polydipsia. Does not bruise/bleed easily.  This ROS reflects current status, prior to arrival to the ED was positive for dizziness and generalized weakness.  SUBJECTIVE:   VITAL SIGNS: Temp:  [97.9 F (36.6 C)-98.8 F (37.1 C)] 97.9 F (36.6 C) (08/10 1634) Pulse Rate:  [85-90] 90 (08/10 1634) Resp:  [11-18] 16 (08/10 1634) BP: (88-105)/(45-55) 90/55 mmHg (08/10 1634) SpO2:  [98 %-100 %] 98 % (08/10 1634)  PHYSICAL EXAMINATION: General:  Well appearing female, NAD. Neuro:  Alert and interactive, following all commands. HEENT:  [place]/AT, PERRLA, EOM-I and dry mucous membranes. Cardiovascular:  RRR, Nl S1/S2, -M/R/G. Lungs:  CTA bilaterally. Abdomen:  Soft, NT, ND and +BS. Musculoskeletal:  -edema and -tenderness. Skin:  Intact.   Recent Labs Lab 09/12/13 1531 09/17/13 1259  NA 133* 129*  K 2.5* 3.2*  CL 86* 84*  CO2 33* 28  BUN 35* 43*  CREATININE 0.82 0.93  GLUCOSE 103* 103*    Recent Labs Lab 09/12/13 1531 09/17/13 1259  HGB 7.0* 4.8*  HCT 22.3* 15.5*  WBC 4.0 5.4  PLT 197 216   Ct Head Wo Contrast  09/17/2013   CLINICAL DATA:  Dizziness and weakness.  EXAM: CT HEAD WITHOUT CONTRAST  TECHNIQUE: Contiguous axial images were obtained from the base of the skull through the vertex without intravenous contrast.  COMPARISON:  05/17/2013  FINDINGS: Ventricles, cisterns and other CSF spaces are within normal. There is no mass, mass effect, shift of midline structures or acute hemorrhage. No evidence of acute infarction. Remaining bones and soft tissues are within normal.  IMPRESSION: No acute intracranial findings.   Electronically Signed   By: Marin Olp M.D.   On: 09/17/2013 13:52    ASSESSMENT / PLAN:  73 year old female presenting with melena x2 days, dizziness and weakness.  Hypotension due to blood loss.  Already responding to blood transfusion, BP is increasing, patient feels  better and is conversant.  Hypokalemia.  Plan: - Ok to admit to the SDU via [place]. - Transfuse at least three more units pRBC. - Check coags (not on anti-coag by history). - Reverse any coagulopathy if present. - GI consult to be called. - Replace K. - F/U H&H. - If patient deteriorates please feel free to call PCCM back.  Rush Farmer, M.D. [facility] Pulmonary/Critical Care Medicine. Pager: [phone]. After hours pager: [phone].  09/17/2013, 4:43 PM

## 2013-09-17 NOTE — ED Notes (Signed)
EDMD at bedside

## 2013-09-17 NOTE — ED Notes (Signed)
Patient transported to CT 

## 2013-09-17 NOTE — ED Notes (Addendum)
Admitting MD; Harle Stanford at bedside gave a verbal order to this RN to decrease blood transfusion from 125 ml/hr to 100 ml/hr

## 2013-09-17 NOTE — ED Notes (Signed)
Dizzy weak foggy started  Last night  Called clinic and was told to come to ER  No abd pain no cp

## 2013-09-17 NOTE — ED Provider Notes (Signed)
CSN: [id number]     Arrival date & time 09/17/13  1204 History   First MD Initiated Contact with Patient 09/17/13 1241     Chief Complaint  Patient presents with  . Dizziness  . Weakness     (Consider location/radiation/quality/duration/timing/severity/associated sxs/prior Treatment) Patient is a 73 y.o. female presenting with dizziness and weakness.  Dizziness Associated symptoms: no chest pain, no diarrhea, no nausea, no palpitations, no shortness of breath and no vomiting   Weakness Associated symptoms include fatigue and weakness. Pertinent negatives include no abdominal pain, chest pain, chills, diaphoresis, fever, nausea, numbness or vomiting.    Ms Scrivens is a 73 year old woman with dHF, AI s/p AVR, and recent hospitalization for diverticular bleed who presents with two days of general fatigue and malaise. She was recently hospitalized in 08/2013 with a diverticular bleed that required 5 U PRBC transfusion during her admission. She reports that two days ago she began feeling a general fatigue. She cannot pinpoint any specific pain or complaint other than feeling very weak. She also notes dark tarry stools for the past two days but denies any hematochezia. She was scheduled for a capsule endoscopy on 8/14 with Dr Dorrene German. Colonoscopy with diverticulosis and negative EGD 06/2013 in [facility].  Past Medical History  Diagnosis Date  . CHF (congestive heart failure)   . Thyroid disease   . Neuropathy   . Diverticular disease    Past Surgical History  Procedure Laterality Date  . Tee without cardioversion N/A 08/30/2013    Procedure: TRANSESOPHAGEAL ECHOCARDIOGRAM (TEE);  Surgeon: Larey Dresser, MD;  Location: [place];  Service: Cardiovascular;  Laterality: N/A;   No family history on file. History  Substance Use Topics  . Smoking status: Former Research scientist (life sciences)  . Smokeless tobacco: Former Systems developer    Quit date: 07/10/1984  . Alcohol Use: No   OB History   Grav Para Term Preterm  Abortions TAB SAB Ect Mult Living                 Review of Systems  Constitutional: Positive for fatigue. Negative for fever, chills, diaphoresis and unexpected weight change.  Respiratory: Negative for shortness of breath.   Cardiovascular: Negative for chest pain and palpitations.  Gastrointestinal: Negative for nausea, vomiting, abdominal pain, diarrhea, constipation and anal bleeding.  Genitourinary: Negative for dysuria.  Skin: Positive for pallor.  Neurological: Positive for dizziness, weakness and light-headedness. Negative for syncope and numbness.      Allergies  Oxycodone and Penicillins  Home Medications   Prior to Admission medications   Medication Sig Start Date End Date Taking? Authorizing Provider  acetaminophen (TYLENOL) 325 MG tablet Take 650 mg by mouth every 6 (six) hours as needed (pain).    Yes Historical Provider, MD  ferrous sulfate 325 (65 FE) MG tablet Take 325 mg by mouth 3 (three) times daily with meals.    Yes Historical Provider, MD  gabapentin (NEURONTIN) 300 MG capsule Take 300 mg by mouth at bedtime.  05/31/13  Yes Historical Provider, MD  levothyroxine (SYNTHROID, LEVOTHROID) 200 MCG tablet Take 1 tablet (200 mcg total) by mouth daily before breakfast. 07/26/13  Yes Barton Dubois, MD  LORazepam (ATIVAN) 1 MG tablet Take 1 mg by mouth at bedtime as needed for anxiety or sleep. sleep 06/01/13  Yes Historical Provider, MD  potassium chloride SA (K-DUR,KLOR-CON) 20 MEQ tablet Take 20 mEq by mouth 3 (three) times daily.  07/02/13  Yes Historical Provider, MD  sorbitol 70 % SOLN  Take 15 mLs by mouth 2 (two) times daily as needed for moderate constipation. 09/07/13  Yes Julious Oka, MD  spironolactone (ALDACTONE) 25 MG tablet Take 1 tablet (25 mg total) by mouth daily. 08/13/13  Yes Amy D Clegg, NP  torsemide (DEMADEX) 20 MG tablet Take 4 tablets (80 mg total) by mouth 2 (two) times daily. 09/07/13  Yes Julious Oka, MD   BP 93/45  Pulse 89  Temp(Src) 97.9 F  (36.6 C) (Oral)  Resp 12  SpO2 99% Physical Exam  Constitutional: She is oriented to person, place, and time. She appears well-developed and well-nourished.  Tired but arousable and engaged  HENT:  Head: Normocephalic and atraumatic.  Mouth/Throat: Oropharynx is clear and moist.  Eyes: EOM are normal. Pupils are equal, round, and reactive to light.  Cardiovascular: Normal rate, regular rhythm and intact distal pulses.   Murmur heard. Pulmonary/Chest: Effort normal and breath sounds normal.  Abdominal: Soft. Bowel sounds are normal. She exhibits no distension. There is no tenderness.  Genitourinary: Guaiac positive stool.  Internal and external hemorrhoids  Musculoskeletal:  Woody edema to knees bilaterally but non pitting  Neurological: She is alert and oriented to person, place, and time.  Skin: She is not diaphoretic. There is pallor.    ED Course  Procedures (including critical care time) Labs Review Labs Reviewed  PRO B NATRIURETIC PEPTIDE - Abnormal; Notable for the following:    Pro B Natriuretic peptide (BNP) 3720.0 (*)    All other components within normal limits  BASIC METABOLIC PANEL - Abnormal; Notable for the following:    Sodium 129 (*)    Potassium 3.2 (*)    Chloride 84 (*)    Glucose, Bld 103 (*)    BUN 43 (*)    GFR calc non Af Amer 60 (*)    GFR calc Af Amer 69 (*)    Anion gap 17 (*)    All other components within normal limits  CBC WITH DIFFERENTIAL - Abnormal; Notable for the following:    RBC 1.73 (*)    Hemoglobin 4.8 (*)    HCT 15.5 (*)    RDW 16.8 (*)    Monocytes Relative 13 (*)    All other components within normal limits  URINALYSIS, ROUTINE W REFLEX MICROSCOPIC - Abnormal; Notable for the following:    APPearance HAZY (*)    Urobilinogen, UA 4.0 (*)    Leukocytes, UA TRACE (*)    All other components within normal limits  URINE MICROSCOPIC-ADD ON - Abnormal; Notable for the following:    Squamous Epithelial / LPF FEW (*)    Bacteria,  UA MANY (*)    All other components within normal limits  POC OCCULT BLOOD, ED - Abnormal; Notable for the following:    Fecal Occult Bld POSITIVE (*)    All other components within normal limits  TROPONIN I  TYPE AND SCREEN  PREPARE RBC (CROSSMATCH)    Imaging Review Ct Head Wo Contrast  09/17/2013   CLINICAL DATA:  Dizziness and weakness.  EXAM: CT HEAD WITHOUT CONTRAST  TECHNIQUE: Contiguous axial images were obtained from the base of the skull through the vertex without intravenous contrast.  COMPARISON:  05/17/2013  FINDINGS: Ventricles, cisterns and other CSF spaces are within normal. There is no mass, mass effect, shift of midline structures or acute hemorrhage. No evidence of acute infarction. Remaining bones and soft tissues are within normal.  IMPRESSION: No acute intracranial findings.   Electronically Signed   By:  Marin Olp M.D.   On: 09/17/2013 13:52     EKG Interpretation None     RBBB unchanged from 08/29/13 MDM   Final diagnoses:  Acute GI bleeding  Acute blood loss anemia  Acute kidney injury  General weakness  Hypotension, unspecified hypotension type    1:25PM: Patient presents with general fatigue x 2 days with concurrent melena. She has history of diverticular bleeds including last month. Fecal occult blood positive. She likely is anemic secondary to GI bleed. EKG unchanged from 08/28/13. CBC w diff, BMP, troponin I, proBNP, UA pending. Protonix 40 mg iv once and type and screen with 2U PRBC available pending CBC.  2:37PM: Hemoglobin returned at 4.8. It was 7 five days ago. Will transfuse 2 U PRBC. BP stable so will hold on fluids for now given history of heart failure (proBNP currently 3720). Nurse will obtain second PIV and let MD know if ultrasound is needed to obtain second access. K 3.2 with QTc 525 so will give KCl 10 mEq. Heat CT no acute intracranial abnormality. Troponin negative.   4:06PM: Patient BP was 70s/30s so ordered 500 cc NS bolus. Called GI  and intensivist consults who both agreed to see the patient. Initial GI rec per Dr. Collene Mares is maintain hemodynamic stability while ICU will assess her for appropriate placement on admission. Patient signed out to Dr. Idelia Salm.  Kelby Aline, MD 09/17/13 [phone]

## 2013-09-17 NOTE — Progress Notes (Addendum)
Evaluated patient at bedside. Nicole Hansen is doing well. Reports some fatigue. Denies chest pain or shortness of breath. No new complaints.  Filed Vitals:   09/17/13 1928 09/17/13 2000 09/17/13 2106 09/17/13 2138  BP: 100/49 106/56 101/54 98/51  Pulse: 91 94  90  Temp: 98.2 F (36.8 C) 97.9 F (36.6 C) 97.9 F (36.6 C) 98.1 F (36.7 C)  TempSrc: Oral     Resp: 11 16 15 12   Height:      Weight:      SpO2: 99% 100%  100%   General: NAD, sitting up in bed HEENT: decreased pallor CV: RRR Respiratory: minimal right base crackles, CTAB otherwise Extremities: 2+ pitting edema to shins bilaterally Neuro: alert and oriented x3, answering questions appropriately  Will continue to transfuse. Continue to monitor.  Patient seen and discussed with senior resident, Dr. Riley Churches, PGY1 Internal Medicine Teaching Service

## 2013-09-18 ENCOUNTER — Encounter (HOSPITAL_COMMUNITY)
Admission: EM | Disposition: A | Discharge status: Home / Self Care | Payer: Medicare Other | Source: Home / Self Care | Attending: Internal Medicine

## 2013-09-18 DIAGNOSIS — I5030 Unspecified diastolic (congestive) heart failure: Secondary | ICD-10-CM

## 2013-09-18 DIAGNOSIS — I509 Heart failure, unspecified: Secondary | ICD-10-CM

## 2013-09-18 DIAGNOSIS — E876 Hypokalemia: Secondary | ICD-10-CM

## 2013-09-18 DIAGNOSIS — I5032 Chronic diastolic (congestive) heart failure: Secondary | ICD-10-CM

## 2013-09-18 DIAGNOSIS — E871 Hypo-osmolality and hyponatremia: Secondary | ICD-10-CM

## 2013-09-18 HISTORY — PX: GIVENS CAPSULE STUDY: SHX5432

## 2013-09-18 LAB — BASIC METABOLIC PANEL
Anion gap: 14 (ref 5–15)
Anion gap: 15 (ref 5–15)
BUN: 39 mg/dL — AB (ref 6–23)
BUN: 32 mg/dL — AB (ref 6–23)
CO2: 28 mEq/L (ref 19–32)
CO2: 26 mEq/L (ref 19–32)
CREATININE: 0.9 mg/dL (ref 0.50–1.10)
Calcium: 8.2 mg/dL — ABNORMAL LOW (ref 8.4–10.5)
Calcium: 8.2 mg/dL — ABNORMAL LOW (ref 8.4–10.5)
Chloride: 87 mEq/L — ABNORMAL LOW (ref 96–112)
Chloride: 88 mEq/L — ABNORMAL LOW (ref 96–112)
Creatinine, Ser: 0.94 mg/dL (ref 0.50–1.10)
GFR calc Af Amer: 68 mL/min — ABNORMAL LOW (ref >90)
GFR calc non Af Amer: 59 mL/min — ABNORMAL LOW (ref >90)
GFR, EST AFRICAN AMERICAN: 72 mL/min — AB (ref >90)
GFR, EST NON AFRICAN AMERICAN: 62 mL/min — AB (ref >90)
GLUCOSE: 119 mg/dL — AB (ref 70–99)
Glucose, Bld: 102 mg/dL — ABNORMAL HIGH (ref 70–99)
POTASSIUM: 3.2 meq/L — AB (ref 3.7–5.3)
Potassium: 3.4 mEq/L — ABNORMAL LOW (ref 3.7–5.3)
Sodium: 129 mEq/L — ABNORMAL LOW (ref 137–147)
Sodium: 129 mEq/L — ABNORMAL LOW (ref 137–147)

## 2013-09-18 LAB — PROTIME-INR
INR: 1.48 (ref 0.00–1.49)
Prothrombin Time: 17.9 seconds — ABNORMAL HIGH (ref 11.6–15.2)

## 2013-09-18 LAB — GLUCOSE, CAPILLARY
GLUCOSE-CAPILLARY: 109 mg/dL — AB (ref 70–99)
Glucose-Capillary: 115 mg/dL — ABNORMAL HIGH (ref 70–99)

## 2013-09-18 LAB — CBC
HCT: 22.5 % — ABNORMAL LOW (ref 36.0–46.0)
HEMATOCRIT: 20.9 % — AB (ref 36.0–46.0)
HEMATOCRIT: 23.3 % — AB (ref 36.0–46.0)
HEMOGLOBIN: 6.8 g/dL — AB (ref 12.0–15.0)
HEMOGLOBIN: 7.4 g/dL — AB (ref 12.0–15.0)
HEMOGLOBIN: 7.6 g/dL — AB (ref 12.0–15.0)
MCH: 28.9 pg (ref 26.0–34.0)
MCH: 28.6 pg (ref 26.0–34.0)
MCH: 28.5 pg (ref 26.0–34.0)
MCHC: 32.5 g/dL (ref 30.0–36.0)
MCHC: 32.9 g/dL (ref 30.0–36.0)
MCHC: 32.6 g/dL (ref 30.0–36.0)
MCV: 88.9 fL (ref 78.0–100.0)
MCV: 86.9 fL (ref 78.0–100.0)
MCV: 87.3 fL (ref 78.0–100.0)
PLATELETS: 187 10*3/uL (ref 150–400)
Platelets: 196 10*3/uL (ref 150–400)
Platelets: 184 10*3/uL (ref 150–400)
RBC: 2.35 MIL/uL — AB (ref 3.87–5.11)
RBC: 2.59 MIL/uL — ABNORMAL LOW (ref 3.87–5.11)
RBC: 2.67 MIL/uL — ABNORMAL LOW (ref 3.87–5.11)
RDW: 15.9 % — ABNORMAL HIGH (ref 11.5–15.5)
RDW: 15.6 % — ABNORMAL HIGH (ref 11.5–15.5)
RDW: 15.8 % — ABNORMAL HIGH (ref 11.5–15.5)
WBC: 5.9 10*3/uL (ref 4.0–10.5)
WBC: 7.1 10*3/uL (ref 4.0–10.5)
WBC: 7.1 10*3/uL (ref 4.0–10.5)

## 2013-09-18 LAB — MAGNESIUM: Magnesium: 2.4 mg/dL (ref 1.5–2.5)

## 2013-09-18 LAB — LACTIC ACID, PLASMA: Lactic Acid, Venous: 1.1 mmol/L (ref 0.5–2.2)

## 2013-09-18 LAB — APTT: APTT: 35 s (ref 24–37)

## 2013-09-18 LAB — TROPONIN I: Troponin I: <0.3 ng/mL (ref <0.30)

## 2013-09-18 LAB — PREPARE RBC (CROSSMATCH)

## 2013-09-18 SURGERY — IMAGING PROCEDURE, GI TRACT, INTRALUMINAL, VIA CAPSULE
Anesthesia: LOCAL

## 2013-09-18 MED ORDER — SODIUM CHLORIDE 0.9 % IV SOLN: Freq: Once | INTRAVENOUS | Status: DC

## 2013-09-18 MED ORDER — POTASSIUM CHLORIDE CRYS ER 20 MEQ PO TBCR
30.0000 meq | EXTENDED_RELEASE_TABLET | Freq: Two times a day (BID) | ORAL | Status: AC
  Administered 2013-09-18 (×2): 30 meq via ORAL
  Filled 2013-09-18 (×2): qty 1

## 2013-09-18 SURGICAL SUPPLY — 1 items: TOWEL COTTON PACK 4EA (MISCELLANEOUS) ×4 IMPLANT

## 2013-09-18 NOTE — Progress Notes (Addendum)
Subjective:  Feels well overall. Has dizziness upon sitting. Had 1 more BM that was tarry black today. No cp/sob, palpitations.   Objective: Vital signs in last 24 hours: Filed Vitals:   09/18/13 0827 09/18/13 0847 09/18/13 0853 09/18/13 1100  BP: 100/51 112/66 100/52   Pulse: 83     Temp: 98.3 F (36.8 C)   97.7 F (36.5 C)  TempSrc: Oral   Oral  Resp: 16 22    Height:      Weight:      SpO2: 100% 94%     Weight change:   Intake/Output Summary (Last 24 hours) at 09/18/13 1323 Last data filed at 09/18/13 0827  Gross per 24 hour  Intake   1343 ml  Output    650 ml  Net    693 ml   Vitals reviewed. General: resting in bed, NAD HEENT: PERRL, EOMI, no scleral icterus Cardiac: RRR, no rubs, has systolic ejection murmur.  Pulm: clear to auscultation bilaterally, no wheezes, rales, or rhonchi Abd: soft, nontender, nondistended, BS present Ext: warm and well perfused, 2+ petal edema upto shins bilaterally. Neuro: alert and oriented X3, cranial nerves II-XII grossly intact, strength and sensation to light touch equal in bilateral upper and lower extremities  Lab Results: Basic Metabolic Panel:  Recent Labs Lab 09/17/13 1259 09/18/13 0220  NA 129* 129*  K 3.2* 3.2*  CL 84* 87*  CO2 28 28  GLUCOSE 103* 102*  BUN 43* 39*  CREATININE 0.93 0.90  CALCIUM 8.7 8.2*  MG  --  2.4   Liver Function Tests: No results found for this basename: AST, ALT, ALKPHOS, BILITOT, PROT, ALBUMIN,  in the last 168 hours No results found for this basename: LIPASE, AMYLASE,  in the last 168 hours No results found for this basename: AMMONIA,  in the last 168 hours CBC:  Recent Labs Lab 09/17/13 1259 09/18/13 0220 09/18/13 1030  WBC 5.4 5.9 7.1  NEUTROABS 3.4  --   --   HGB 4.8* 6.8* 7.4*  HCT 15.5* 20.9* 22.5*  MCV 89.6 88.9 86.9  PLT 216 196 187   Cardiac Enzymes:  Recent Labs Lab 09/17/13 1259 09/18/13 1030  TROPONINI <0.30 <0.30   BNP:  Recent Labs Lab  09/17/13 1259  PROBNP 3720.0*   D-Dimer: No results found for this basename: DDIMER,  in the last 168 hours CBG:  Recent Labs Lab 09/18/13 0506 09/18/13 0724  GLUCAP 115* 109*  Coagulation:  Recent Labs Lab 09/18/13 0220  LABPROT 17.9*  INR 1.48  Urinalysis:  Recent Labs Lab 09/17/13 1351  COLORURINE YELLOW  LABSPEC 1.012  PHURINE 6.0  GLUCOSEU NEGATIVE  HGBUR NEGATIVE  BILIRUBINUR NEGATIVE  KETONESUR NEGATIVE  PROTEINUR NEGATIVE  UROBILINOGEN 4.0*  NITRITE NEGATIVE  LEUKOCYTESUR TRACE*   Misc. Labs:   Micro Results: Recent Results (from the past 240 hour(s))  MRSA PCR SCREENING     Status: None   Collection Time    09/17/13  6:43 PM      Result Value Ref Range Status   MRSA by PCR NEGATIVE  NEGATIVE Final   Comment:            The GeneXpert MRSA Assay (FDA     approved for NASAL specimens     only), is one component of a     comprehensive MRSA colonization     surveillance program. It is not     intended to diagnose MRSA     infection nor to guide  or     monitor treatment for     MRSA infections.   Studies/Results: Ct Head Wo Contrast  09/17/2013   CLINICAL DATA:  Dizziness and weakness.  EXAM: CT HEAD WITHOUT CONTRAST  TECHNIQUE: Contiguous axial images were obtained from the base of the skull through the vertex without intravenous contrast.  COMPARISON:  05/17/2013  FINDINGS: Ventricles, cisterns and other CSF spaces are within normal. There is no mass, mass effect, shift of midline structures or acute hemorrhage. No evidence of acute infarction. Remaining bones and soft tissues are within normal.  IMPRESSION: No acute intracranial findings.   Electronically Signed   By: Marin Olp M.D.   On: 09/17/2013 13:52   Dg Chest Port 1 View  09/17/2013   CLINICAL DATA:  One-day history of shortness of breath with a history of CHF and anemia  EXAM: PORTABLE CHEST - 1 VIEW  COMPARISON:  PA and lateral chest x-ray of August 28, 2013  FINDINGS: The lungs are  adequately inflated. There is no focal infiltrate. The interstitial markings are mildly increased. The costophrenic angles remain sharp. The cardiac silhouette is mildly enlarged and the central pulmonary vascularity is engorged. The patient has undergone previous CABG. There are mild degenerative changes of the left shoulder.  IMPRESSION: Congestive heart failure with mild pulmonary interstitial edema. The pleural effusion on the right has cleared since the study of August 28, 2013.   Electronically Signed   By: David  [place]   On: 09/17/2013 17:56   Medications: I have reviewed the patient's current medications. Scheduled Meds: . sodium chloride   Intravenous Once  . gabapentin  300 mg Oral QHS  . pantoprazole (PROTONIX) IV  40 mg Intravenous Q12H  . potassium chloride  30 mEq Oral BID  . sodium chloride  3 mL Intravenous Q12H   Continuous Infusions:  PRN Meds:. Assessment/Plan: Principal Problem:   Acute symptomatic blood loss anemia Active Problems:   Hypotension   Hypokalemia   Acute on chronic diastolic CHF (congestive heart failure)   Acute GI bleeding   Increased anion gap metabolic acidosis  Acute blood loss anemia likely 2/2 to GI Bleed  Patient still having melena. With hx of diverticular bleed, this is likely recurrence of that.  - GI consulted, will swallow capsule today for EGD, will get results sometime tomorrow.  - transfused 3 units so far. Hgb 7.4 after 3 units. Will check another CBC 6PM. Transfuse if Hgb <7.0 - per heart failure rec, will give 80 IV lasix after transfusion. - replete K+ as needed, has low K+ chronically.  - may consider IV iron to help stablize anemia later. - cont IV protonix.  Diastolic CHF- ECHO TEE 03/21/92 EF 60-65%  currently clinically euvolemic al though her weigh is up and BNP is elevated.. Might become volume overloaded after transfusion  - per heart failure rec, will give 80 IV lasix after transfusion. - daily weights, I/O.  Hypokalemia   - replete K+ as needed, has low K+ chronically.  - will switch to spironolactone for diuresis once can have PO.  PPX: SCD.  Diet: on clear diet for now.  Code: full  Dispo: Disposition is deferred at this time, awaiting improvement of current medical problems.  Anticipated discharge in approximately 2-3 day(s).   The patient does have a current PCP Rubie Maid, MD) and does need an [facility] hospital follow-up appointment after discharge.  The patient does not know have transportation limitations that hinder transportation to clinic appointments.  .Services  Needed at time of discharge: Y = Yes, Blank = No PT:   OT:   RN:   Equipment:   Other:     LOS: 1 day   Dellia Nims, MD 09/18/2013, 1:23 PM

## 2013-09-18 NOTE — Progress Notes (Signed)
Utilization review completed.  

## 2013-09-18 NOTE — Consult Note (Signed)
Advanced Heart Failure Team History and Physical Note    HPI:    73 yo female with history of bioprosthetic AVR, diastolic CHF and GI bleeding admitted with recurrent GI bleed with Hgb 4. We are consulted for management of HF.  Recently discharged from hospital after treatment for GI and recurrent HF. Discharge weight was 174. I saw her in clinic on 8/5 and was doing well on oral diuretics. Weight down to 169.   Admitted with recurrent melena and weakness. Hgb found to be 4.8. Has been transfused. Hgb now 7.4 . No melena x 2 days. Had severe epistaxis this am. Denies dyspnea. Bed weight recorded as 189.    Review of Systems: [y] = yes, [ ]  = no   General: Weight gain Blue.Reese ]; Weight loss [ ] ; Anorexia [ ] ; Fatigue [ y]; Fever [ ] ; Chills [ ] ; Weakness [ y]  Cardiac: Chest pain/pressure [ ] ; Resting SOB [ ] ; Exertional SOB Blue.Reese ]; Orthopnea [ ] ; Pedal Edema [ y]; Palpitations [ ] ; Syncope [ ] ; Presyncope [ ] ; Paroxysmal nocturnal dyspnea[ ]   Pulmonary: Cough [ ] ; Wheezing[ ] ; Hemoptysis[ ] ; Sputum [ ] ; Snoring [ ]   GI: Vomiting[ ] ; Dysphagia[ ] ; Melena[y ]; Hematochezia [ ] ; Heartburn[ ] ; Abdominal pain [ ] ; Constipation Blue.Reese ]; Diarrhea [ ] ; BRBPR Blue.Reese ]  GU: Hematuria[ ] ; Dysuria [ ] ; Nocturia[ ]   Vascular: Pain in legs with walking [ ] ; Pain in feet with lying flat [ ] ; Non-healing sores [ ] ; Stroke [ ] ; TIA [ ] ; Slurred speech [ ] ;  Neuro: Headaches[ ] ; Vertigo[ ] ; Seizures[ ] ; Paresthesias[ ] ;Blurred vision [ ] ; Diplopia [ ] ; Vision changes [ ]   Ortho/Skin: Arthritis [ ] ; Joint pain [y]; Muscle pain [ ] ; Joint swelling [ ] ; Back Pain [ ] ; Rash [ ]   Psych: Depression[ ] ; Anxiety[ ]   Heme: Bleeding problems Blue.Reese ]; Clotting disorders [ ] ; Anemia [ y]  Endocrine: Diabetes [ ] ; Thyroid dysfunction[ ]   Home Medications Prior to Admission medications   Medication Sig Start Date End Date Taking? Authorizing Provider  acetaminophen (TYLENOL) 325 MG tablet Take 650 mg by mouth every 6 (six) hours as  needed (pain).    Yes Historical Provider, MD  ferrous sulfate 325 (65 FE) MG tablet Take 325 mg by mouth 3 (three) times daily with meals.    Yes Historical Provider, MD  gabapentin (NEURONTIN) 300 MG capsule Take 300 mg by mouth at bedtime.  05/31/13  Yes Historical Provider, MD  levothyroxine (SYNTHROID, LEVOTHROID) 200 MCG tablet Take 1 tablet (200 mcg total) by mouth daily before breakfast. 07/26/13  Yes Barton Dubois, MD  LORazepam (ATIVAN) 1 MG tablet Take 1 mg by mouth at bedtime as needed for anxiety or sleep. sleep 06/01/13  Yes Historical Provider, MD  potassium chloride SA (K-DUR,KLOR-CON) 20 MEQ tablet Take 20 mEq by mouth 3 (three) times daily.  07/02/13  Yes Historical Provider, MD  sorbitol 70 % SOLN Take 15 mLs by mouth 2 (two) times daily as needed for moderate constipation. 09/07/13  Yes Julious Oka, MD  spironolactone (ALDACTONE) 25 MG tablet Take 1 tablet (25 mg total) by mouth daily. 08/13/13  Yes Amy D Clegg, NP  torsemide (DEMADEX) 20 MG tablet Take 4 tablets (80 mg total) by mouth 2 (two) times daily. 09/07/13  Yes Julious Oka, MD    Past Medical History: Past Medical History  Diagnosis Date  . CHF (congestive heart failure)   . Thyroid disease   . Neuropathy   .  Diverticular disease     Past Surgical History: Past Surgical History  Procedure Laterality Date  . Tee without cardioversion N/A 08/30/2013    Procedure: TRANSESOPHAGEAL ECHOCARDIOGRAM (TEE);  Surgeon: Larey Dresser, MD;  Location: [facility] ENDOSCOPY;  Service: Cardiovascular;  Laterality: N/A;    Family History: No family h/o premature CAD or familial cardiomyopathy.   Social History: History   Social History  . Marital Status: Married    Spouse Name: N/A    Number of Children: N/A  . Years of Education: N/A   Social History Main Topics  . Smoking status: Former Research scientist (life sciences)  . Smokeless tobacco: Former Systems developer    Quit date: 07/10/1984  . Alcohol Use: No  . Drug Use: No  . Sexual Activity: None   Other  Topics Concern  . None   Social History Narrative   Lives with Husband in [place].     Allergies:  Allergies  Allergen Reactions  . Oxycodone Itching    Severe diffuse itching  . Penicillins Rash    Objective:    Vital Signs:   Temp:  [97.7 F (36.5 C)-98.8 F (37.1 C)] 98.3 F (36.8 C) (08/11 0827) Pulse Rate:  [83-94] 83 (08/11 0827) Resp:  [11-22] 22 (08/11 0847) BP: (88-112)/(42-66) 100/52 mmHg (08/11 0853) SpO2:  [90 %-100 %] 94 % (08/11 0847) Weight:  [83.8 kg (184 lb 11.9 oz)-85.6 kg (188 lb 11.4 oz)] 85.6 kg (188 lb 11.4 oz) (08/11 0500) Last BM Date: 09/18/13 Filed Weights   09/17/13 1837 09/18/13 0500  Weight: 83.8 kg (184 lb 11.9 oz) 85.6 kg (188 lb 11.4 oz)    Physical Exam: General: Pale No resp difficulty Daughter present  HEENT: normal  Neck: supple. JVP 8 + prominent CV waves . Carotids 2+ bilaterally; no bruits. No lymphadenopathy or thryomegaly appreciated.  Cor: PMI normal. Regular rate & rhythm. No rubs, gallops 2/6 SEM RUSB  Lungs: clear  Abdomen: soft, nontender, nondistended. No hepatosplenomegaly. No bruits or masses. Good bowel sounds.  Extremities: no cyanosis, clubbing, rash, R and LLE 1+ edema to knees +SCDs Neuro: alert & orientedx3, cranial nerves grossly intact. Moves all 4 extremities w/o difficulty. Affect pleasant.   Telemetry: NSR 80-90s  Labs: Basic Metabolic Panel:  Recent Labs Lab 09/12/13 1531 09/17/13 1259 09/18/13 0220  NA 133* 129* 129*  K 2.5* 3.2* 3.2*  CL 86* 84* 87*  CO2 33* 28 28  GLUCOSE 103* 103* 102*  BUN 35* 43* 39*  CREATININE 0.82 0.93 0.90  CALCIUM 8.7 8.7 8.2*  MG  --   --  2.4    Liver Function Tests: No results found for this basename: AST, ALT, ALKPHOS, BILITOT, PROT, ALBUMIN,  in the last 168 hours No results found for this basename: LIPASE, AMYLASE,  in the last 168 hours No results found for this basename: AMMONIA,  in the last 168 hours  CBC:  Recent Labs Lab 09/12/13 1531  09/17/13 1259 09/18/13 0220 09/18/13 1030  WBC 4.0 5.4 5.9 7.1  NEUTROABS  --  3.4  --   --   HGB 7.0* 4.8* 6.8* 7.4*  HCT 22.3* 15.5* 20.9* 22.5*  MCV 93.7 89.6 88.9 86.9  PLT 197 216 196 187    Cardiac Enzymes:  Recent Labs Lab 09/17/13 1259 09/18/13 1030  TROPONINI <0.30 <0.30    BNP: BNP (last 3 results)  Recent Labs  07/24/13 0610 08/28/13 1330 09/17/13 1259  PROBNP 837.4* 2110.0* 3720.0*    CBG:  Recent Labs Lab 09/18/13  0506 09/18/13 0724  GLUCAP 115* 109*    Coagulation Studies:  Recent Labs  09/18/13 0220  LABPROT 17.9*  INR 1.48    Other results: EKG: NSR 88 RBBB 1AVB  No ST-T wave abnormalities.    Imaging: Ct Head Wo Contrast  09/17/2013   CLINICAL DATA:  Dizziness and weakness.  EXAM: CT HEAD WITHOUT CONTRAST  TECHNIQUE: Contiguous axial images were obtained from the base of the skull through the vertex without intravenous contrast.  COMPARISON:  05/17/2013  FINDINGS: Ventricles, cisterns and other CSF spaces are within normal. There is no mass, mass effect, shift of midline structures or acute hemorrhage. No evidence of acute infarction. Remaining bones and soft tissues are within normal.  IMPRESSION: No acute intracranial findings.   Electronically Signed   By: Marin Olp M.D.   On: 09/17/2013 13:52   Dg Chest Port 1 View  09/17/2013   CLINICAL DATA:  One-day history of shortness of breath with a history of CHF and anemia  EXAM: PORTABLE CHEST - 1 VIEW  COMPARISON:  PA and lateral chest x-ray of August 28, 2013  FINDINGS: The lungs are adequately inflated. There is no focal infiltrate. The interstitial markings are mildly increased. The costophrenic angles remain sharp. The cardiac silhouette is mildly enlarged and the central pulmonary vascularity is engorged. The patient has undergone previous CABG. There are mild degenerative changes of the left shoulder.  IMPRESSION: Congestive heart failure with mild pulmonary interstitial edema. The  pleural effusion on the right has cleared since the study of August 28, 2013.   Electronically Signed   By: David  Martinique   On: 09/17/2013 17:56         Assessment:  73 yo with history of bioprosthetic AVR and diastolic CHF presented with recurrent GI bleeding and chronic diastolic CHF.   1. Acute blood loss anemia:  2. Chronic diastolic CHF: R>L . EF 55-60% on 6/15 echo.  3. Bioprosthetic aortic valve:  Mean gradient 20  mmHg suggests perhaps mild to moderate AS from patient-prosthetic mismatch.  4. Mitral stenosis: TEEshowed mild to moderate MR with heavy MAC and a restricted posterior leaflet. The mean gradient across the valve was 10 mmHg but MVA by PHT was > 2.  5. Hypokalemia - this has been persistent, severe problem for her. Will use spironolactone judiciously to help.  6. Hyponatremia  Plan/Discussion:    Despite her weight being up she does not have as much peripheral edema as I would have expected. Given her bleeding would hold diuretics for now and can consider restarting in next day or two. Would give 80 iv lasix after each unit of RBCs. Supp K+.   We will follow.   Length of Stay: 1 Glori Bickers MD 09/18/2013, 11:56 AM Advanced Heart Failure Team Pager [phone] (M-F; [place])  Please contact [facility] for night-coverage after hours (4p -7a ) and weekends on [email or web address]

## 2013-09-18 NOTE — H&P (Signed)
  Date: 09/18/2013  Patient name: [place] record number: [id number]  Date of birth: 1940-08-30   This patient has been seen and the plan of care was discussed with the house staff. Please see their note for complete details. I concur with their findings with the following additions/corrections:  Nicole Hansen presents with acute mental status changes likley due to diveticular bleed and anemia with hgb of 4.8. Much improved after 3 unit of RBC transfusion. Due to underlying congestive heart failure, will have her receive diuretics after transfusions so that her heart failure does not worsen. Will have GI evaluate and see if can do capsule endoscopy as inpatient. Patient is clinically stable, we will continue with Q12hr CBC checks to see if she needs further rbc transfusions. Appreciate cardiac/chf management input.  [name], MD 09/18/2013, 3:46 PM

## 2013-09-19 ENCOUNTER — Inpatient Hospital Stay (HOSPITAL_COMMUNITY): Admission: RE | Admit: 2013-09-19 | Payer: Medicare Other | Source: Ambulatory Visit

## 2013-09-19 ENCOUNTER — Encounter (HOSPITAL_COMMUNITY): Payer: Self-pay | Admitting: Gastroenterology

## 2013-09-19 LAB — BASIC METABOLIC PANEL WITH GFR
Anion gap: 13 (ref 5–15)
Anion gap: 14 (ref 5–15)
BUN: 28 mg/dL — ABNORMAL HIGH (ref 6–23)
BUN: 28 mg/dL — ABNORMAL HIGH (ref 6–23)
CO2: 27 meq/L (ref 19–32)
CO2: 26 meq/L (ref 19–32)
Calcium: 8 mg/dL — ABNORMAL LOW (ref 8.4–10.5)
Calcium: 8.2 mg/dL — ABNORMAL LOW (ref 8.4–10.5)
Chloride: 85 meq/L — ABNORMAL LOW (ref 96–112)
Chloride: 85 meq/L — ABNORMAL LOW (ref 96–112)
Creatinine, Ser: 0.89 mg/dL (ref 0.50–1.10)
Creatinine, Ser: 0.9 mg/dL (ref 0.50–1.10)
GFR calc Af Amer: 73 mL/min — ABNORMAL LOW
GFR calc Af Amer: 72 mL/min — ABNORMAL LOW
GFR calc non Af Amer: 63 mL/min — ABNORMAL LOW
GFR calc non Af Amer: 62 mL/min — ABNORMAL LOW
Glucose, Bld: 90 mg/dL (ref 70–99)
Glucose, Bld: 115 mg/dL — ABNORMAL HIGH (ref 70–99)
Potassium: 3.4 meq/L — ABNORMAL LOW (ref 3.7–5.3)
Potassium: 3.7 meq/L (ref 3.7–5.3)
Sodium: 125 meq/L — ABNORMAL LOW (ref 137–147)
Sodium: 125 meq/L — ABNORMAL LOW (ref 137–147)

## 2013-09-19 LAB — CBC
HCT: 23 % — ABNORMAL LOW (ref 36.0–46.0)
HCT: 23.9 % — ABNORMAL LOW (ref 36.0–46.0)
Hemoglobin: 7.6 g/dL — ABNORMAL LOW (ref 12.0–15.0)
Hemoglobin: 7.7 g/dL — ABNORMAL LOW (ref 12.0–15.0)
MCH: 29.6 pg (ref 26.0–34.0)
MCH: 28.1 pg (ref 26.0–34.0)
MCHC: 33 g/dL (ref 30.0–36.0)
MCHC: 32.2 g/dL (ref 30.0–36.0)
MCV: 89.5 fL (ref 78.0–100.0)
MCV: 87.2 fL (ref 78.0–100.0)
Platelets: 175 K/uL (ref 150–400)
Platelets: 179 K/uL (ref 150–400)
RBC: 2.57 MIL/uL — ABNORMAL LOW (ref 3.87–5.11)
RBC: 2.74 MIL/uL — ABNORMAL LOW (ref 3.87–5.11)
RDW: 15.9 % — ABNORMAL HIGH (ref 11.5–15.5)
RDW: 16 % — ABNORMAL HIGH (ref 11.5–15.5)
WBC: 5.5 K/uL (ref 4.0–10.5)
WBC: 6.1 K/uL (ref 4.0–10.5)

## 2013-09-19 LAB — GLUCOSE, CAPILLARY
Glucose-Capillary: 90 mg/dL (ref 70–99)
Glucose-Capillary: 109 mg/dL — ABNORMAL HIGH (ref 70–99)

## 2013-09-19 MED ORDER — TORSEMIDE 20 MG PO TABS
80.0000 mg | ORAL_TABLET | Freq: Two times a day (BID) | ORAL | Status: DC
  Administered 2013-09-19: 80 mg via ORAL
  Filled 2013-09-19 (×4): qty 4

## 2013-09-19 MED ORDER — SODIUM CHLORIDE 0.9 % IV SOLN: INTRAVENOUS | Status: DC

## 2013-09-19 MED ORDER — LEVOTHYROXINE SODIUM 200 MCG PO TABS
200.0000 ug | ORAL_TABLET | Freq: Every day | ORAL | Status: AC
  Administered 2013-09-20: 200 ug via ORAL
  Filled 2013-09-19 (×2): qty 1

## 2013-09-19 MED ORDER — ACETAMINOPHEN 325 MG PO TABS
650.0000 mg | ORAL_TABLET | Freq: Once | ORAL | Status: AC
  Administered 2013-09-19: 650 mg via ORAL
  Filled 2013-09-19: qty 2

## 2013-09-19 MED ORDER — ACETAMINOPHEN 325 MG PO TABS
650.0000 mg | ORAL_TABLET | Freq: Four times a day (QID) | ORAL | Status: DC | PRN
  Administered 2013-09-19: 650 mg via ORAL
  Filled 2013-09-19: qty 2

## 2013-09-19 MED ORDER — POTASSIUM CHLORIDE CRYS ER 20 MEQ PO TBCR: 30.0000 meq | EXTENDED_RELEASE_TABLET | Freq: Two times a day (BID) | ORAL | Status: DC

## 2013-09-19 MED ORDER — SPIRONOLACTONE 25 MG PO TABS
25.0000 mg | ORAL_TABLET | Freq: Every day | ORAL | Status: DC
  Administered 2013-09-19 – 2013-09-20 (×2): 25 mg via ORAL
  Filled 2013-09-19 (×2): qty 1

## 2013-09-19 MED ORDER — POTASSIUM CHLORIDE CRYS ER 20 MEQ PO TBCR
30.0000 meq | EXTENDED_RELEASE_TABLET | Freq: Once | ORAL | Status: AC
  Administered 2013-09-19: 30 meq via ORAL
  Filled 2013-09-19: qty 1

## 2013-09-19 NOTE — Progress Notes (Signed)
Pt last received 80mg  dose of torsemide at 1500. Spoke with Dr. Randell Patient about 2200 dose. Orders received to hold 2200 dose. Will continue to monitor.

## 2013-09-19 NOTE — Progress Notes (Signed)
Unassigned patient Subjective: Since I last evaluated the patient, she seems to be doing fairly well. Continues to have melena. Capsule study results not available as the [facility] system is down. Hope to have the results by tomorrow. Patient denies having any abdominal pain, nausea or vomiting.  Objective: Vital signs in last 24 hours: Temp:  [97.3 F (36.3 C)-98.3 F (36.8 C)] 97.9 F (36.6 C) (08/12 1608) Pulse Rate:  [79-86] 79 (08/12 1154) Resp:  [15-21] 21 (08/12 1154) BP: (93-109)/(46-63) 109/56 mmHg (08/12 1154) SpO2:  [94 %-100 %] 100 % (08/12 1154) Weight:  [84.3 kg (185 lb 13.6 oz)] 84.3 kg (185 lb 13.6 oz) (08/12 0500) Last BM Date: 09/19/13  Intake/Output from previous day: 08/11 0701 - 08/12 0700 In: 303 [I.V.:3; Blood:300] Out: 1300 [Urine:1300] Intake/Output this shift: Total I/O In: 1200 [P.O.:1200] Out: 1275 [Urine:1275]  General appearance: alert, cooperative, appears stated age and no distress Resp: clear to auscultation bilaterally Cardio: regular rate and rhythm, S1, S2 normal, no murmur, click, rub or gallop GI: soft, non-tender; bowel sounds normal; no masses,  no organomegaly Extremities: extremities normal, atraumatic, no cyanosis and pedal edema upto the shins  Lab Results:  Recent Labs  09/18/13 1030 09/18/13 1800 09/19/13 0830  WBC 7.1 7.1 5.5  HGB 7.4* 7.6* 7.6*  HCT 22.5* 23.3* 23.0*  PLT 187 184 175   BMET  Recent Labs  09/18/13 1800 09/19/13 0248 09/19/13 0830  NA 129* 125* 125*  K 3.4* 3.4* 3.7  CL 88* 85* 85*  CO2 26 27 26   GLUCOSE 119* 90 115*  BUN 32* 28* 28*  CREATININE 0.94 0.89 0.90  CALCIUM 8.2* 8.0* 8.2*   HStudies/Results: Dg Chest Port 1 View  09/17/2013   CLINICAL DATA:  One-day history of shortness of breath with a history of CHF and anemia  EXAM: PORTABLE CHEST - 1 VIEW  COMPARISON:  PA and lateral chest x-ray of August 28, 2013  FINDINGS: The lungs are adequately inflated. There is no focal infiltrate. The  interstitial markings are mildly increased. The costophrenic angles remain sharp. The cardiac silhouette is mildly enlarged and the central pulmonary vascularity is engorged. The patient has undergone previous CABG. There are mild degenerative changes of the left shoulder.  IMPRESSION: Congestive heart failure with mild pulmonary interstitial edema. The pleural effusion on the right has cleared since the study of August 28, 2013.   Electronically Signed   By: David  Martinique   On: 09/17/2013 17:56   Medications: I have reviewed the patient's current medications.  Assessment/Plan: Anemia/melena-etiology unclear; patient has colonic diverticulosis and gastric polyps. Will wait for the capsule results and make recommendations as needed.   LOS: 2 days   Clarann Helvey 09/19/2013, 5:21 PM

## 2013-09-19 NOTE — Progress Notes (Signed)
Advanced Heart Failure Team Rounding Note    HPI:    73 yo female with history of bioprosthetic AVR, diastolic CHF and GI bleeding admitted with recurrent GI bleed with Hgb 4. We are consulted for management of HF.  Recently discharged from hospital after treatment for GI and recurrent HF. Discharge weight was 174. I saw her in clinic on 8/5 and was doing well on oral diuretics. Weight down to 169.   Admitted with recurrent melena and weakness. Hgb found to be 4.8. Has been transfused. Hgb now 7.4 . No melena x 2 days.   No complaints this am. No further bleeding noted. No dyspnea   Objective:    Vital Signs:   Temp:  [97.7 F (36.5 C)-98.6 F (37 C)] 97.8 F (36.6 C) (08/12 0325) Pulse Rate:  [83-86] 85 (08/12 0325) Resp:  [15-22] 16 (08/12 0325) BP: (93-112)/(46-66) 93/46 mmHg (08/12 0325) SpO2:  [94 %-100 %] 94 % (08/12 0325) Last BM Date: 09/18/13 Filed Weights   09/17/13 1837 09/18/13 0500  Weight: 83.8 kg (184 lb 11.9 oz) 85.6 kg (188 lb 11.4 oz)    Physical Exam: General: Lying flat. NAD HEENT: normal  Neck: supple. JVP 10 + prominent CV waves . Carotids 2+ bilaterally; no bruits. No lymphadenopathy or thryomegaly appreciated.  Cor: PMI normal. Regular rate & rhythm. No rubs, gallops 2/6 SEM RUSB  Lungs: clear  Abdomen: soft, nontender, nondistended. No hepatosplenomegaly. No bruits or masses. Good bowel sounds.  Extremities: no cyanosis, clubbing, rash, R and LLE 2+ edema to knees +SCDs Neuro: alert & orientedx3, cranial nerves grossly intact. Moves all 4 extremities w/o difficulty. Affect pleasant.   Telemetry: NSR 80-90s  Labs: Basic Metabolic Panel:  Recent Labs Lab 09/12/13 1531 09/17/13 1259 09/18/13 0220 09/18/13 1800 09/19/13 0248  NA 133* 129* 129* 129* 125*  K 2.5* 3.2* 3.2* 3.4* 3.4*  CL 86* 84* 87* 88* 85*  CO2 33* 28 28 26 27   GLUCOSE 103* 103* 102* 119* 90  BUN 35* 43* 39* 32* 28*  CREATININE 0.82 0.93 0.90 0.94 0.89  CALCIUM 8.7 8.7  8.2* 8.2* 8.0*  MG  --   --  2.4  --   --     Liver Function Tests: No results found for this basename: AST, ALT, ALKPHOS, BILITOT, PROT, ALBUMIN,  in the last 168 hours No results found for this basename: LIPASE, AMYLASE,  in the last 168 hours No results found for this basename: AMMONIA,  in the last 168 hours  CBC:  Recent Labs Lab 09/12/13 1531 09/17/13 1259 09/18/13 0220 09/18/13 1030 09/18/13 1800  WBC 4.0 5.4 5.9 7.1 7.1  NEUTROABS  --  3.4  --   --   --   HGB 7.0* 4.8* 6.8* 7.4* 7.6*  HCT 22.3* 15.5* 20.9* 22.5* 23.3*  MCV 93.7 89.6 88.9 86.9 87.3  PLT 197 216 196 187 184    Cardiac Enzymes:  Recent Labs Lab 09/17/13 1259 09/18/13 1030  TROPONINI <0.30 <0.30    BNP: BNP (last 3 results)  Recent Labs  07/24/13 0610 08/28/13 1330 09/17/13 1259  PROBNP 837.4* 2110.0* 3720.0*    CBG:  Recent Labs Lab 09/18/13 0506 09/18/13 0724  GLUCAP 115* 109*    Coagulation Studies:  Recent Labs  09/18/13 0220  LABPROT 17.9*  INR 1.48    Other results: EKG: NSR 88 RBBB 1AVB  No ST-T wave abnormalities.    Imaging: Ct Head Wo Contrast  09/17/2013   CLINICAL DATA:  Dizziness  and weakness.  EXAM: CT HEAD WITHOUT CONTRAST  TECHNIQUE: Contiguous axial images were obtained from the base of the skull through the vertex without intravenous contrast.  COMPARISON:  05/17/2013  FINDINGS: Ventricles, cisterns and other CSF spaces are within normal. There is no mass, mass effect, shift of midline structures or acute hemorrhage. No evidence of acute infarction. Remaining bones and soft tissues are within normal.  IMPRESSION: No acute intracranial findings.   Electronically Signed   By: Marin Olp M.D.   On: 09/17/2013 13:52   Dg Chest Port 1 View  09/17/2013   CLINICAL DATA:  One-day history of shortness of breath with a history of CHF and anemia  EXAM: PORTABLE CHEST - 1 VIEW  COMPARISON:  PA and lateral chest x-ray of August 28, 2013  FINDINGS: The lungs are  adequately inflated. There is no focal infiltrate. The interstitial markings are mildly increased. The costophrenic angles remain sharp. The cardiac silhouette is mildly enlarged and the central pulmonary vascularity is engorged. The patient has undergone previous CABG. There are mild degenerative changes of the left shoulder.  IMPRESSION: Congestive heart failure with mild pulmonary interstitial edema. The pleural effusion on the right has cleared since the study of August 28, 2013.   Electronically Signed   By: David  Martinique   On: 09/17/2013 17:56        Assessment:  74 yo with history of bioprosthetic AVR and diastolic CHF presented with recurrent GI bleeding and chronic diastolic CHF.   1. Acute blood loss anemia:  2. Chronic diastolic CHF: R>L . EF 55-60% on 6/15 echo.  3. Bioprosthetic aortic valve:  Mean gradient 20  mmHg suggests perhaps mild to moderate AS from patient-prosthetic mismatch.  4. Mitral stenosis: TEEshowed mild to moderate MR with heavy MAC and a restricted posterior leaflet. The mean gradient across the valve was 10 mmHg but MVA by PHT was > 2.  5. Hypokalemia - this has been persistent, severe problem for her. Will use spironolactone judiciously to help.  6. Hyponatremia  Plan/Discussion:    Volume status up. No further bleeding. Hemodynamically stable. Will restart home demadex and spiro carefully. Can adjust as needed. Supp K+. Capsule endo pending.   Length of Stay: 2 Glori Bickers MD 09/19/2013, 5:40 AM Advanced Heart Failure Team Pager [phone] (M-F; 7a - 4p)  Please contact [facility] for night-coverage after hours (4p -7a ) and weekends on [email or web address]

## 2013-09-19 NOTE — Progress Notes (Signed)
  Date: 09/19/2013  Patient name: Nicole Hansen record number: [id number]  Date of birth: 03-Oct-1940   This patient has been seen and the plan of care was discussed with the house staff. Please see their note for complete details. I concur with their findings with the following additions/corrections:  Patient tolerating blood transfusions for her GI bleed however exam does show pulmonary crackles. We  Will restart diuretics at 1/2 dose and follow hct since she is still reporting to have melena. GI will provide results capsule endoscopy results as they become available.  [name], MD 09/19/2013, 8:37 PM

## 2013-09-19 NOTE — Progress Notes (Addendum)
Subjective:  Feels well. Has dizziness and feeling hungry. Had 1 tarry BM that was tarry black today. No cp/sob, palpitations.  Capsule study was done today. Is able to tolerate food afterwards.   Objective: Vital signs in last 24 hours: Filed Vitals:   09/19/13 0500 09/19/13 0728 09/19/13 0941 09/19/13 1154  BP:  96/50  109/56  Pulse:  80  79  Temp:  97.9 F (36.6 C)  97.3 F (36.3 C)  TempSrc:  Oral  Oral  Resp:  16  21  Height:      Weight: 84.3 kg (185 lb 13.6 oz)     SpO2:  100% 98% 100%   Weight change: 0.5 kg (1 lb 1.6 oz)  Intake/Output Summary (Last 24 hours) at 09/19/13 1406 Last data filed at 09/19/13 1246  Gross per 24 hour  Intake    723 ml  Output   1675 ml  Net   -952 ml   Vitals reviewed. General: resting in bed, NAD HEENT: PERRL, EOMI, no scleral icterus Cardiac: RRR, no rubs, has systolic ejection murmur. +JVD upto ears. Pulm: has some bibasilar crackles, no wheezes, rales, or rhonchi Abd: soft, nontender, nondistended, BS present Ext: warm and well perfused, 2+ petal edema upto shins bilaterally. Neuro: alert and oriented X3, cranial nerves II-XII grossly intact, strength and sensation to light touch equal in bilateral upper and lower extremities  Lab Results: Basic Metabolic Panel:  Recent Labs Lab 09/18/13 0220  09/19/13 0248 09/19/13 0830  NA 129*  < > 125* 125*  K 3.2*  < > 3.4* 3.7  CL 87*  < > 85* 85*  CO2 28  < > 27 26  GLUCOSE 102*  < > 90 115*  BUN 39*  < > 28* 28*  CREATININE 0.90  < > 0.89 0.90  CALCIUM 8.2*  < > 8.0* 8.2*  MG 2.4  --   --   --   < > = values in this interval not displayed. Liver Function Tests: No results found for this basename: AST, ALT, ALKPHOS, BILITOT, PROT, ALBUMIN,  in the last 168 hours No results found for this basename: LIPASE, AMYLASE,  in the last 168 hours No results found for this basename: AMMONIA,  in the last 168 hours CBC:  Recent Labs Lab 09/17/13 1259  09/18/13 1800 09/19/13 0830    WBC 5.4  < > 7.1 5.5  NEUTROABS 3.4  --   --   --   HGB 4.8*  < > 7.6* 7.6*  HCT 15.5*  < > 23.3* 23.0*  MCV 89.6  < > 87.3 89.5  PLT 216  < > 184 175  < > = values in this interval not displayed. Cardiac Enzymes:  Recent Labs Lab 09/17/13 1259 09/18/13 1030  TROPONINI <0.30 <0.30   BNP:  Recent Labs Lab 09/17/13 1259  PROBNP 3720.0*   D-Dimer: No results found for this basename: DDIMER,  in the last 168 hours CBG:  Recent Labs Lab 09/18/13 0506 09/18/13 0724 09/19/13 0726 09/19/13 1152  GLUCAP 115* 109* 90 109*  Coagulation:  Recent Labs Lab 09/18/13 0220  LABPROT 17.9*  INR 1.48  Urinalysis:  Recent Labs Lab 09/17/13 1351  COLORURINE YELLOW  LABSPEC 1.012  PHURINE 6.0  GLUCOSEU NEGATIVE  HGBUR NEGATIVE  BILIRUBINUR NEGATIVE  KETONESUR NEGATIVE  PROTEINUR NEGATIVE  UROBILINOGEN 4.0*  NITRITE NEGATIVE  LEUKOCYTESUR TRACE*   Misc. Labs:   Micro Results: Recent Results (from the past 240 hour(s))  MRSA PCR  SCREENING     Status: None   Collection Time    09/17/13  6:43 PM      Result Value Ref Range Status   MRSA by PCR NEGATIVE  NEGATIVE Final   Comment:            The GeneXpert MRSA Assay (FDA     approved for NASAL specimens     only), is one component of a     comprehensive MRSA colonization     surveillance program. It is not     intended to diagnose MRSA     infection nor to guide or     monitor treatment for     MRSA infections.   Studies/Results: Dg Chest Port 1 View  09/17/2013   CLINICAL DATA:  One-day history of shortness of breath with a history of CHF and anemia  EXAM: PORTABLE CHEST - 1 VIEW  COMPARISON:  PA and lateral chest x-ray of August 28, 2013  FINDINGS: The lungs are adequately inflated. There is no focal infiltrate. The interstitial markings are mildly increased. The costophrenic angles remain sharp. The cardiac silhouette is mildly enlarged and the central pulmonary vascularity is engorged. The patient has undergone  previous CABG. There are mild degenerative changes of the left shoulder.  IMPRESSION: Congestive heart failure with mild pulmonary interstitial edema. The pleural effusion on the right has cleared since the study of August 28, 2013.   Electronically Signed   By: David  Martinique   On: 09/17/2013 17:56   Medications: I have reviewed the patient's current medications. Scheduled Meds: . sodium chloride   Intravenous Once  . gabapentin  300 mg Oral QHS  . pantoprazole (PROTONIX) IV  40 mg Intravenous Q12H  . sodium chloride  3 mL Intravenous Q12H  . spironolactone  25 mg Oral Daily  . torsemide  80 mg Oral BID   Continuous Infusions: . sodium chloride     PRN Meds:. Assessment/Plan: Principal Problem:   Acute symptomatic blood loss anemia Active Problems:   Hypotension   Hypokalemia   Acute on chronic diastolic CHF (congestive heart failure)   Acute GI bleeding   Increased anion gap metabolic acidosis  Acute blood loss anemia likely 2/2 to GI Bleed  Patient still having melena. With hx of diverticular bleed, this is likely recurrence of that.  - GI consulted, capsuled EGD was done. F/up gi recs. Will read this afternoon - transfused 3 units PRBC's and Hgb currently stable. Getting BID CBC's. F/u CBC, transfuse if <7.0 - replete K+ as needed, has low K+ chronically.  - cont IV protonix.  Diastolic CHF- ECHO TEE 5/44/92 EF 60-65%  Has some crackles on bases and JVD .BNP is elevated. Volume management difficult with low BP - diuresing well (around -997 yesterday). - daily weights, I/O.  - started spironolatone 25 mg daily + demadex 80mg  BID per card recs. Should monitor BP as her BP has been running 90-100's (which is her baseline).   Hypokalemia  - replete K+ as needed, has low K+ chronically.  - started spirnolactone Hypernantremia -likely 2/2 to volume overload. Expecting to improve with diuresis.   PPX: SCD.  Diet: HH diet.  Code: full  Dispo: Disposition is deferred at this  time, awaiting improvement of current medical problems.  Anticipated discharge in approximately 2-3 day(s).   The patient does have a current PCP Rubie Maid, MD) and does need an [facility] hospital follow-up appointment after discharge.  The patient does not know have transportation  limitations that hinder transportation to clinic appointments.  .Services Needed at time of discharge: Y = Yes, Blank = No PT:   OT:   RN:   Equipment:   Other:     LOS: 2 days   Dellia Nims, MD 09/19/2013, 2:06 PM

## 2013-09-19 NOTE — Evaluation (Signed)
Physical Therapy Evaluation Patient Details Name: Nicole Hansen MRN: [id number] DOB: August 28, 1940 Today's Date: 09/19/2013   History of Present Illness  Nicole Hansen is a 73 year old woman with dHF, AI s/p AVR, and recent hospitalization for diverticular bleed who presents with two days of general fatigue and malaise. She was recently hospitalized in 08/2013 with a diverticular bleed that required 5 U PRBC transfusion during her admission.    Clinical Impression  Patient demonstrates deficits in functional mobility as indicated below. Will benefit from continued skilled PT to address deficits and maximize function. Will see as indicated and progress as tolerated.     Follow Up Recommendations Supervision for mobility/OOB    Equipment Recommendations  None recommended by PT    Recommendations for Other Services       Precautions / Restrictions Precautions Precautions: Fall Restrictions Weight Bearing Restrictions: No      Mobility  Bed Mobility               General bed mobility comments: received up in chair  Transfers Overall transfer level: Needs assistance Equipment used: Rolling walker (2 wheeled) Transfers: Sit to/from Stand Sit to Stand: Min guard         General transfer comment: VCs for safety and hand placement, patient with poor control of descent into chair, educated on safety   Ambulation/Gait Ambulation/Gait assistance: Supervision Ambulation Distance (Feet): 310 Feet Assistive device: Rolling walker (2 wheeled) Gait Pattern/deviations: Step-through pattern;Decreased stride length;Trunk flexed;Narrow base of support Gait velocity: decreased Gait velocity interpretation: Below normal speed for age/gender General Gait Details: steady with ambulation, easily distracted cues for attention to task  Stairs            Wheelchair Mobility    Modified Rankin (Stroke Patients Only)       Balance Overall balance assessment: Needs assistance         Standing balance support: During functional activity Standing balance-Leahy Scale: Fair                               Pertinent Vitals/Pain Pain Assessment: 0-10    Home Living Family/patient expects to be discharged to:: Private residence Living Arrangements: Spouse/significant other;Children Available Help at Discharge: Family;Available 24 hours/day Type of Home: House Home Access: Stairs to enter Entrance Stairs-Rails: Right Entrance Stairs-Number of Steps: 3 Home Layout: One level Home Equipment: Walker - 2 wheels;Cane - single point;Bedside commode;Toilet riser;Shower seat;Grab bars - tub/shower      Prior Function Level of Independence: Independent with assistive device(s);Needs assistance   Gait / Transfers Assistance Needed: Uses RW/cane for ambulation.  ADL's / Homemaking Assistance Needed: Daughter assists patient with bath - difficulty getting feet into tub when "swollen"        Hand Dominance   Dominant Hand: Right    Extremity/Trunk Assessment   Upper Extremity Assessment: Generalized weakness           Lower Extremity Assessment: Generalized weakness (increased edema BLEs)         Communication   Communication: No difficulties  Cognition Arousal/Alertness: Awake/alert Behavior During Therapy: WFL for tasks assessed/performed Overall Cognitive Status: Within Functional Limits for tasks assessed                      General Comments General comments (skin integrity, edema, etc.): modified orthostatics assessed VS stable, systolic 563O with activity    Exercises  Assessment/Plan    PT Assessment Patient needs continued PT services  PT Diagnosis Difficulty walking;Generalized weakness   PT Problem List Decreased strength;Decreased activity tolerance;Decreased balance;Decreased mobility;Cardiopulmonary status limiting activity  PT Treatment Interventions DME instruction;Gait training;Stair training;Functional  mobility training;Therapeutic activities;Therapeutic exercise;Balance training;Patient/family education   PT Goals (Current goals can be found in the Care Plan section) Acute Rehab PT Goals Patient Stated Goal: "to get better" PT Goal Formulation: With patient/family Time For Goal Achievement: 10/03/13 Potential to Achieve Goals: Good    Frequency Min 4X/week   Barriers to discharge        Co-evaluation               End of Session Equipment Utilized During Treatment: Gait belt Activity Tolerance: Patient tolerated treatment well Patient left: in chair;with call bell/phone within reach;with family/visitor present Nurse Communication: Mobility status         Time: 5465-0354 PT Time Calculation (min): 25 min   Charges:   PT Evaluation $Initial PT Evaluation Tier I: 1 Procedure PT Treatments $Gait Training: 8-22 mins $Therapeutic Activity: 8-22 mins   PT G CodesDuncan Dull 09/19/2013, 4:28 PM Alben Deeds, [place] DPT  [phone]

## 2013-09-20 LAB — BASIC METABOLIC PANEL
Anion gap: 13 (ref 5–15)
BUN: 24 mg/dL — ABNORMAL HIGH (ref 6–23)
CALCIUM: 7.8 mg/dL — AB (ref 8.4–10.5)
CHLORIDE: 90 meq/L — AB (ref 96–112)
CO2: 26 meq/L (ref 19–32)
Creatinine, Ser: 0.87 mg/dL (ref 0.50–1.10)
GFR calc Af Amer: 75 mL/min — ABNORMAL LOW (ref >90)
GFR calc non Af Amer: 65 mL/min — ABNORMAL LOW (ref >90)
Glucose, Bld: 90 mg/dL (ref 70–99)
Potassium: 3.5 mEq/L — ABNORMAL LOW (ref 3.7–5.3)
Sodium: 129 mEq/L — ABNORMAL LOW (ref 137–147)

## 2013-09-20 LAB — GLUCOSE, CAPILLARY: Glucose-Capillary: 90 mg/dL (ref 70–99)

## 2013-09-20 LAB — CBC
HEMATOCRIT: 23.5 % — AB (ref 36.0–46.0)
HEMOGLOBIN: 7.3 g/dL — AB (ref 12.0–15.0)
MCH: 28.5 pg (ref 26.0–34.0)
MCHC: 31.1 g/dL (ref 30.0–36.0)
MCV: 91.8 fL (ref 78.0–100.0)
Platelets: 159 10*3/uL (ref 150–400)
RBC: 2.56 MIL/uL — ABNORMAL LOW (ref 3.87–5.11)
RDW: 16.2 % — ABNORMAL HIGH (ref 11.5–15.5)
WBC: 5.2 10*3/uL (ref 4.0–10.5)

## 2013-09-20 LAB — MAGNESIUM: Magnesium: 2.1 mg/dL (ref 1.5–2.5)

## 2013-09-20 MED ORDER — LEVOTHYROXINE SODIUM 200 MCG PO TABS
200.0000 ug | ORAL_TABLET | ORAL | Status: DC
  Administered 2013-09-21: 200 ug via ORAL
  Filled 2013-09-20 (×2): qty 1

## 2013-09-20 MED ORDER — POTASSIUM CHLORIDE CRYS ER 20 MEQ PO TBCR
20.0000 meq | EXTENDED_RELEASE_TABLET | Freq: Once | ORAL | Status: AC
  Administered 2013-09-20: 20 meq via ORAL
  Filled 2013-09-20: qty 1

## 2013-09-20 MED ORDER — SPIRONOLACTONE 25 MG PO TABS
25.0000 mg | ORAL_TABLET | Freq: Two times a day (BID) | ORAL | Status: DC
  Administered 2013-09-20 – 2013-09-21 (×2): 25 mg via ORAL
  Filled 2013-09-20 (×4): qty 1

## 2013-09-20 MED ORDER — TORSEMIDE 20 MG PO TABS
80.0000 mg | ORAL_TABLET | Freq: Two times a day (BID) | ORAL | Status: DC
  Administered 2013-09-20 – 2013-09-21 (×3): 80 mg via ORAL
  Filled 2013-09-20 (×4): qty 4

## 2013-09-20 NOTE — Progress Notes (Signed)
Subjective:  Feels well. No longer has dizziness. Stool is brown now, no longer black.  No cp/sob, palpitations. Capsule study was done but couldn't be read yesterday for technical difficulties. Awaiting read.  Objective: Vital signs in last 24 hours: Filed Vitals:   09/20/13 0300 09/20/13 0455 09/20/13 0501 09/20/13 0800  BP:   101/56   Pulse: 88  83   Temp:  97.5 F (36.4 C)  98.3 F (36.8 C)  TempSrc:  Oral  Oral  Resp: 19  15   Height:      Weight:  82.9 kg (182 lb 12.2 oz)    SpO2: 94%  98%    Weight change: -1.4 kg (-3 lb 1.4 oz)  Intake/Output Summary (Last 24 hours) at 09/20/13 1249 Last data filed at 09/20/13 1049  Gross per 24 hour  Intake   1400 ml  Output   2852 ml  Net  -1452 ml   Vitals reviewed. General: resting in bed, NAD HEENT: PERRL, EOMI, no scleral icterus Cardiac: RRR, no rubs, has systolic ejection murmur. +JVD upto ears. Pulm: has some bibasilar crackles, no wheezes, rales, or rhonchi Abd: soft, nontender, nondistended, BS present Ext: warm and well perfused, 2+ petal edema upto shins bilaterally. Neuro: alert and oriented X3, cranial nerves II-XII grossly intact, strength and sensation to light touch equal in bilateral upper and lower extremities  Lab Results: Basic Metabolic Panel:  Recent Labs Lab 09/18/13 0220  09/19/13 0830 09/20/13 0425 09/20/13 0936  NA 129*  < > 125* 129*  --   K 3.2*  < > 3.7 3.5*  --   CL 87*  < > 85* 90*  --   CO2 28  < > 26 26  --   GLUCOSE 102*  < > 115* 90  --   BUN 39*  < > 28* 24*  --   CREATININE 0.90  < > 0.90 0.87  --   CALCIUM 8.2*  < > 8.2* 7.8*  --   MG 2.4  --   --   --  2.1  < > = values in this interval not displayed. Liver Function Tests: No results found for this basename: AST, ALT, ALKPHOS, BILITOT, PROT, ALBUMIN,  in the last 168 hours No results found for this basename: LIPASE, AMYLASE,  in the last 168 hours No results found for this basename: AMMONIA,  in the last 168  hours CBC:  Recent Labs Lab 09/17/13 1259  09/19/13 1835 09/20/13 0936  WBC 5.4  < > 6.1 5.2  NEUTROABS 3.4  --   --   --   HGB 4.8*  < > 7.7* 7.3*  HCT 15.5*  < > 23.9* 23.5*  MCV 89.6  < > 87.2 91.8  PLT 216  < > 179 159  < > = values in this interval not displayed. Cardiac Enzymes:  Recent Labs Lab 09/17/13 1259 09/18/13 1030  TROPONINI <0.30 <0.30   BNP:  Recent Labs Lab 09/17/13 1259  PROBNP 3720.0*   D-Dimer: No results found for this basename: DDIMER,  in the last 168 hours CBG:  Recent Labs Lab 09/18/13 0506 09/18/13 0724 09/19/13 0726 09/19/13 1152 09/20/13 0744  GLUCAP 115* 109* 90 109* 90  Coagulation:  Recent Labs Lab 09/18/13 0220  LABPROT 17.9*  INR 1.48  Urinalysis:  Recent Labs Lab 09/17/13 1351  COLORURINE YELLOW  LABSPEC 1.012  PHURINE 6.0  GLUCOSEU NEGATIVE  HGBUR NEGATIVE  BILIRUBINUR NEGATIVE  KETONESUR NEGATIVE  PROTEINUR NEGATIVE  UROBILINOGEN 4.0*  NITRITE NEGATIVE  LEUKOCYTESUR TRACE*   Misc. Labs:   Micro Results: Recent Results (from the past 240 hour(s))  MRSA PCR SCREENING     Status: None   Collection Time    09/17/13  6:43 PM      Result Value Ref Range Status   MRSA by PCR NEGATIVE  NEGATIVE Final   Comment:            The GeneXpert MRSA Assay (FDA     approved for NASAL specimens     only), is one component of a     comprehensive MRSA colonization     surveillance program. It is not     intended to diagnose MRSA     infection nor to guide or     monitor treatment for     MRSA infections.   Studies/Results: No results found. Medications: I have reviewed the patient's current medications. Scheduled Meds: . sodium chloride   Intravenous Once  . gabapentin  300 mg Oral QHS  . [START ON 09/21/2013] levothyroxine  200 mcg Oral Q24H  . pantoprazole (PROTONIX) IV  40 mg Intravenous Q12H  . sodium chloride  3 mL Intravenous Q12H  . spironolactone  25 mg Oral BID  . torsemide  80 mg Oral BID    Continuous Infusions:   PRN Meds:. Assessment/Plan: Principal Problem:   Acute symptomatic blood loss anemia Active Problems:   Hypotension   Hypokalemia   Acute on chronic diastolic CHF (congestive heart failure)   Acute GI bleeding   Increased anion gap metabolic acidosis  Acute blood loss anemia likely 2/2 to GI Bleed  Patient not longer having melena. With hx of diverticular bleed, this is likely recurrence of that.  - GI consulted, capsuled EGD was done but not read yet. F/up gi recs.  - transfused 3 units PRBC's and Hgb currently stable. Getting BID CBC's. F/u CBC, transfuse if <7.0 - replete K+ as needed, has low K+ chronically.  - cont IV protonix.  Diastolic CHF- ECHO TEE 0/34/74 EF 60-65%  Has some crackles on bases and JVD .BNP is elevated. Volume management difficult with low BP - diuresing well ~2.7 L yesterday. - daily weights, I/O.  - increase spironolatone 25 mg daily to BID + demadex 80mg  BID per card recs. -Her BP is holding up well with the diuretics.  - has lot of room for diueresis still as she is 15 lb more than her dry weight. Will cut down on diuresis as needed outpatient.  Hypokalemia  - replete K+ as needed, has low K+ chronically.  - spirolactone increased to 25mg  BID today from qd. Hypernantremia -likely 2/2 to volume overload. Expecting to improve with diuresis.  PPX: SCD.  Diet: HH diet.  Code: full  Dispo: Disposition is deferred at this time, awaiting improvement of current medical problems.  Anticipated discharge in approximately 2-3 day(s).   The patient does have a current PCP Rubie Maid, MD) and does need an [facility] hospital follow-up appointment after discharge.  The patient does not know have transportation limitations that hinder transportation to clinic appointments.  .Services Needed at time of discharge: Y = Yes, Blank = No PT:   OT:   RN:   Equipment:   Other:     LOS: 3 days   Dellia Nims, MD 09/20/2013, 12:49 PM

## 2013-09-20 NOTE — Progress Notes (Signed)
Advanced Heart Failure Team Rounding Note    HPI:    73 yo female with history of bioprosthetic AVR, diastolic CHF and GI bleeding admitted with recurrent GI bleed with Hgb 4. We are consulted for management of HF.  Recently discharged from hospital after treatment for GI and recurrent HF. Discharge weight was 174. I saw her in clinic on 8/5 and was doing well on oral diuretics. Weight down to 169.   Admitted with recurrent melena and weakness. Hgb found to be 4.8. Has been transfused. Hgb now 7.4 . No melena x 3 days.   No complaints this am. No further bleeding noted. Capsule endo being dowloaded today. Home demadex restarted yesterday (just got one dose). Good output. No dyspnea   Objective:    Vital Signs:   Temp:  [97.3 F (36.3 C)-98.3 F (36.8 C)] 98.3 F (36.8 C) (08/13 0800) Pulse Rate:  [25-90] 83 (08/13 0501) Resp:  [13-27] 15 (08/13 0501) BP: (101-126)/(54-77) 101/56 mmHg (08/13 0501) SpO2:  [91 %-100 %] 98 % (08/13 0501) Weight:  [82.9 kg (182 lb 12.2 oz)] 82.9 kg (182 lb 12.2 oz) (08/13 0455) Last BM Date: 09/19/13 Filed Weights   09/18/13 0500 09/19/13 0500 09/20/13 0455  Weight: 85.6 kg (188 lb 11.4 oz) 84.3 kg (185 lb 13.6 oz) 82.9 kg (182 lb 12.2 oz)    Physical Exam: General: sittingin chair NAD HEENT: normal  Neck: supple. JVP 10 + prominent CV waves . Carotids 2+ bilaterally; no bruits. No lymphadenopathy or thryomegaly appreciated.  Cor: PMI normal. Regular rate & rhythm. No rubs, gallops 2/6 SEM RUSB  Lungs: clear  Abdomen: soft, nontender, nondistended. No hepatosplenomegaly. No bruits or masses. Good bowel sounds.  Extremities: no cyanosis, clubbing, rash, R and LLE 2+ edema into thighs +SCDs Neuro: alert & orientedx3, cranial nerves grossly intact. Moves all 4 extremities w/o difficulty. Affect pleasant.   Telemetry: NSR 80-90s  Labs: Basic Metabolic Panel:  Recent Labs Lab 09/18/13 0220 09/18/13 1800 09/19/13 0248 09/19/13 0830  09/20/13 0425  NA 129* 129* 125* 125* 129*  K 3.2* 3.4* 3.4* 3.7 3.5*  CL 87* 88* 85* 85* 90*  CO2 28 26 27 26 26   GLUCOSE 102* 119* 90 115* 90  BUN 39* 32* 28* 28* 24*  CREATININE 0.90 0.94 0.89 0.90 0.87  CALCIUM 8.2* 8.2* 8.0* 8.2* 7.8*  MG 2.4  --   --   --   --     Liver Function Tests: No results found for this basename: AST, ALT, ALKPHOS, BILITOT, PROT, ALBUMIN,  in the last 168 hours No results found for this basename: LIPASE, AMYLASE,  in the last 168 hours No results found for this basename: AMMONIA,  in the last 168 hours  CBC:  Recent Labs Lab 09/17/13 1259 09/18/13 0220 09/18/13 1030 09/18/13 1800 09/19/13 0830 09/19/13 1835  WBC 5.4 5.9 7.1 7.1 5.5 6.1  NEUTROABS 3.4  --   --   --   --   --   HGB 4.8* 6.8* 7.4* 7.6* 7.6* 7.7*  HCT 15.5* 20.9* 22.5* 23.3* 23.0* 23.9*  MCV 89.6 88.9 86.9 87.3 89.5 87.2  PLT 216 196 187 184 175 179    Cardiac Enzymes:  Recent Labs Lab 09/17/13 1259 09/18/13 1030  TROPONINI <0.30 <0.30    BNP: BNP (last 3 results)  Recent Labs  07/24/13 0610 08/28/13 1330 09/17/13 1259  PROBNP 837.4* 2110.0* 3720.0*    CBG:  Recent Labs Lab 09/18/13 0506 09/18/13 0724 09/19/13 0726 09/19/13  1152 09/20/13 0744  GLUCAP 115* 109* 90 109* 90    Coagulation Studies:  Recent Labs  09/18/13 0220  LABPROT 17.9*  INR 1.48    Other results: EKG: NSR 88 RBBB 1AVB  No ST-T wave abnormalities.    Imaging: No results found.      Assessment:  73 yo with history of bioprosthetic AVR and diastolic CHF presented with recurrent GI bleeding and chronic diastolic CHF.   1. Acute blood loss anemia:  2. Chronic diastolic CHF: R>L . EF 55-60% on 6/15 echo.  3. Bioprosthetic aortic valve:  Mean gradient 20  mmHg suggests perhaps mild to moderate AS from patient-prosthetic mismatch.  4. Mitral stenosis: TEEshowed mild to moderate MR with heavy MAC and a restricted posterior leaflet. The mean gradient across the valve was  10 mmHg but MVA by PHT was > 2.  5. Hypokalemia - this has been persistent, severe problem for her. Will use spironolactone judiciously to help.  6. Hyponatremia  Plan/Discussion:    Responding well to home demadex regimen. BP stable. She is still about 15 pounds from dry weight. Would continue demadex at 80 bid. We can decrease as needed. Increase spiro to 25 bid to help with hypokalemia which she always struggles with. We have changed timing of her diuretic dosing so she is not up all night.   Consider low-dose lovenox for DVT prophylaxis.   Length of Stay: 3 Glori Bickers MD 09/20/2013, 9:52 AM Advanced Heart Failure Team Pager [phone] (M-F; 7a - 4p)  Please contact [facility] for night-coverage after hours (4p -7a ) and weekends on [email or web address]

## 2013-09-20 NOTE — Care Management Note (Signed)
    Page 1 of 1   09/21/2013     4:14:43 PM CARE MANAGEMENT NOTE 09/21/2013  Patient:  Nicole Hansen, Nicole Hansen   Account Number:  [id number]  Date Initiated:  09/18/2013  Documentation initiated by:  Marvetta Gibbons  Subjective/Objective Assessment:   Pt admitted with anemia- hgb 4.8     Action/Plan:   PTA pt lived at home   Anticipated DC Date:  09/20/2013   Anticipated DC Plan:  [place]  CM consult      Choice offered to / List presented to:             Status of service:  Completed, signed off Medicare Important Message given?  YES (If response is "NO", the following Medicare IM given date fields will be blank) Date Medicare IM given:  09/20/2013 Medicare IM given by:  Marvetta Gibbons Date Additional Medicare IM given:   Additional Medicare IM given by:    Discharge Disposition:  HOME/SELF CARE  Per UR Regulation:  Reviewed for med. necessity/level of care/duration of stay  If discussed at [place] of Stay Meetings, dates discussed:    Comments:

## 2013-09-20 NOTE — Progress Notes (Signed)
  Date: 09/20/2013  Patient name: Kidron record number: [id number]  Date of birth: Feb 24, 1940   This patient has been seen and the plan of care was discussed with the house staff. Please see their note for complete details. I concur with their findings with the following additions/corrections:  Patient appears to no longer have gi bleeding as her bowel movements have improved. No longer having melena. Her hct appears stable. She is also tolerating diuresis for management of heart failure. Appreciate cardiology-CHF team for their recommendations. Awaiting results from capsule study and can likely discharge home.  [name], MD 09/20/2013, 5:37 PM

## 2013-09-20 NOTE — ED Provider Notes (Signed)
Medical screening examination/treatment/procedure(s) were conducted as a shared visit with non-physician practitioner(s) or resident and myself. I personally evaluated the patient during the encounter and agree with the findings.  I have personally reviewed any xrays and/ or EKG's with the provider and I agree with interpretation.  Patient history of congestive heart failure GI bleed, recent admission for likely diverticular bleed presents with general weakness, shortness of breath, dizziness or worsen over the past 24 hours. Initially patient denied chest bleeding however admitted to black stools the past with 3 days. Melena in ER. Hemoglobin in the fours. Patient received 5 units of blood last admission. 2 units of blood ordered stat, blood pressure did trend down the 70s but improved with fluid bolus, blood starting. Resident discussed the case with gastroenterology and critical care for admission. Patient has 2 good work IV for this time. On exam patient pale, general mild weakness, abdomen soft no focal tenderness no guarding, equal strength bilateral, neck supple, dry mucous membranes, head atraumatic. Pt BP dropped to 70s in ED, ICU consulted for admission, GI consulted.    CRITICAL CARE Performed by: Mariea Clonts   Total critical care time: 45 min  Critical care time was exclusive of separately billable procedures and treating other patients.  Critical care was necessary to treat or prevent imminent or life-threatening deterioration.  Critical care was time spent personally by me on the following activities: development of treatment plan with patient and/or surrogate as well as nursing, discussions with consultants, evaluation of patient's response to treatment, examination of patient, obtaining history from patient or surrogate, ordering and performing treatments and interventions, ordering and review of laboratory studies, ordering and review of radiographic studies, pulse oximetry and  re-evaluation of patient's condition.  Acute GI bleed, blood loss anemia, hypotension, general weakness, congestive heart failure, dyspnea   Mariea Clonts, MD 09/20/13 [phone]

## 2013-09-20 NOTE — Progress Notes (Signed)
Physical Therapy Treatment Patient Details Name: Nicole Hansen MRN: [id number] DOB: 1940-03-12 Today's Date: 09/20/2013    History of Present Illness Nicole Hansen is a 73 year old woman with dHF, AI s/p AVR, and recent hospitalization for diverticular bleed who presents with two days of general fatigue and malaise. She was recently hospitalized in 08/2013 with a diverticular bleed that required 5 U PRBC transfusion during her admission.      PT Comments    Patient tolerated session well, focused on in room ambulation and high level balance and gait training as well as therapeutic exercises as patient did not want to go far from the room secondary to lasix. Patient progressing well continue with current POC.   Follow Up Recommendations  Supervision for mobility/OOB     Equipment Recommendations  None recommended by PT    Recommendations for Other Services       Precautions / Restrictions Precautions Precautions: Fall Restrictions Weight Bearing Restrictions: No    Mobility  Bed Mobility               General bed mobility comments: received up in chair  Transfers Overall transfer level: Needs assistance Equipment used: Rolling walker (2 wheeled);None Transfers: Sit to/from [employer] to Stand: Supervision Stand pivot transfers: Supervision       General transfer comment: some instability noted with transfer when not using assistive device  Ambulation/Gait Ambulation/Gait assistance: Supervision Ambulation Distance (Feet): 200 Feet (Gait training exercises) Assistive device: Rolling walker (2 wheeled);None Gait Pattern/deviations: Step-through pattern;Decreased stride length;Trunk flexed;Narrow base of support Gait velocity: decreased Gait velocity interpretation: Below normal speed for age/gender General Gait Details: see gait training exercises    Stairs            Wheelchair Mobility    Modified Rankin (Stroke Patients Only)        Balance             Standing balance-Leahy Scale: Fair                      Cognition Arousal/Alertness: Awake/alert Behavior During Therapy: WFL for tasks assessed/performed Overall Cognitive Status: Within Functional Limits for tasks assessed                      Exercises General Exercises - Lower Extremity Ankle Circles/Pumps: AROM;10 reps Long Arc Quad: AROM;10 reps Hip ABduction/ADduction: AROM;10 reps Hip Flexion/Marching: AROM;10 reps Mini-Sqauts: AROM;10 reps Other Exercises Other Exercises: Performed in room ambulation including 20 ft x6 with RW, 37ft x2 no device, tandem walking with min assist for stability, side step walking with HHA, backwards walking with RW x2    General Comments        Pertinent Vitals/Pain      Home Living                      Prior Function            PT Goals (current goals can now be found in the care plan section) Acute Rehab PT Goals Patient Stated Goal: "to get better" PT Goal Formulation: With patient/family Time For Goal Achievement: 10/03/13 Potential to Achieve Goals: Good Progress towards PT goals: Progressing toward goals    Frequency  Min 4X/week    PT Plan Current plan remains appropriate    Co-evaluation             End of Session Equipment Utilized During Treatment: Gait belt  Activity Tolerance: Patient tolerated treatment well Patient left: in chair;with call bell/phone within reach;with family/visitor present     Time: 8841-6606 PT Time Calculation (min): 24 min  Charges:  $Gait Training: 8-22 mins $Therapeutic Exercise: 8-22 mins                    G CodesDuncan Dull 24-Sep-2013, 4:51 PM Alben Deeds, [place] DPT  [phone]

## 2013-09-21 DIAGNOSIS — E870 Hyperosmolality and hypernatremia: Secondary | ICD-10-CM

## 2013-09-21 LAB — TYPE AND SCREEN
ABO/RH(D): O POS
Antibody Screen: NEGATIVE
UNIT DIVISION: 0
UNIT DIVISION: 0
Unit division: 0
Unit division: 0
Unit division: 0

## 2013-09-21 LAB — BASIC METABOLIC PANEL
ANION GAP: 15 (ref 5–15)
BUN: 21 mg/dL (ref 6–23)
CHLORIDE: 92 meq/L — AB (ref 96–112)
CO2: 26 mEq/L (ref 19–32)
Calcium: 7.8 mg/dL — ABNORMAL LOW (ref 8.4–10.5)
Creatinine, Ser: 0.96 mg/dL (ref 0.50–1.10)
GFR calc non Af Amer: 57 mL/min — ABNORMAL LOW (ref >90)
GFR, EST AFRICAN AMERICAN: 66 mL/min — AB (ref >90)
Glucose, Bld: 90 mg/dL (ref 70–99)
POTASSIUM: 3.7 meq/L (ref 3.7–5.3)
Sodium: 133 mEq/L — ABNORMAL LOW (ref 137–147)

## 2013-09-21 LAB — CBC
HEMATOCRIT: 22.9 % — AB (ref 36.0–46.0)
Hemoglobin: 7.3 g/dL — ABNORMAL LOW (ref 12.0–15.0)
MCH: 29.1 pg (ref 26.0–34.0)
MCHC: 31.9 g/dL (ref 30.0–36.0)
MCV: 91.2 fL (ref 78.0–100.0)
Platelets: 154 10*3/uL (ref 150–400)
RBC: 2.51 MIL/uL — AB (ref 3.87–5.11)
RDW: 16.2 % — ABNORMAL HIGH (ref 11.5–15.5)
WBC: 5.7 10*3/uL (ref 4.0–10.5)

## 2013-09-21 LAB — GLUCOSE, CAPILLARY: Glucose-Capillary: 83 mg/dL (ref 70–99)

## 2013-09-21 MED ORDER — DOCUSATE SODIUM 100 MG PO CAPS: 100.0000 mg | ORAL_CAPSULE | Freq: Two times a day (BID) | ORAL | Status: AC

## 2013-09-21 MED ORDER — SPIRONOLACTONE 25 MG PO TABS: 25.0000 mg | ORAL_TABLET | Freq: Two times a day (BID) | ORAL | Status: AC

## 2013-09-21 MED ORDER — PANTOPRAZOLE SODIUM 40 MG PO TBEC: 40.0000 mg | DELAYED_RELEASE_TABLET | Freq: Two times a day (BID) | ORAL | Status: AC

## 2013-09-21 NOTE — Progress Notes (Signed)
Subjective:  Feels well. No longer has dizziness. No more melena.  No cp/sob, palpitations. Capsule study was done showing gastritis and esophagitis. Feels ready to go home.  Objective: Vital signs in last 24 hours: Filed Vitals:   09/21/13 0750 09/21/13 0800 09/21/13 1200 09/21/13 1500  BP: 106/52     Pulse: 88     Temp:  98.6 F (37 C) 98.2 F (36.8 C) 97.8 F (36.6 C)  TempSrc:  Oral Oral Oral  Resp: 15     Height:      Weight:      SpO2: 95%      Weight change: -2.296 kg (-5 lb 1 oz)  Intake/Output Summary (Last 24 hours) at 09/21/13 2023 Last data filed at 09/21/13 0900  Gross per 24 hour  Intake    120 ml  Output    451 ml  Net   -331 ml   Vitals reviewed. General: resting in bed, NAD HEENT: PERRL, EOMI, no scleral icterus Cardiac: RRR, no rubs, has systolic ejection murmur.  Pulm: has some bibasilar crackles, no wheezes, rales, or rhonchi Abd: soft, nontender, nondistended, BS present Ext: warm and well perfused, 2+ petal edema upto shins bilaterally but improving. Neuro: alert and oriented X3, cranial nerves II-XII grossly intact, strength and sensation to light touch equal in bilateral upper and lower extremities  Lab Results: Basic Metabolic Panel:  Recent Labs Lab 09/18/13 0220  09/20/13 0425 09/20/13 0936 09/21/13 0258  NA 129*  < > 129*  --  133*  K 3.2*  < > 3.5*  --  3.7  CL 87*  < > 90*  --  92*  CO2 28  < > 26  --  26  GLUCOSE 102*  < > 90  --  90  BUN 39*  < > 24*  --  21  CREATININE 0.90  < > 0.87  --  0.96  CALCIUM 8.2*  < > 7.8*  --  7.8*  MG 2.4  --   --  2.1  --   < > = values in this interval not displayed. Liver Function Tests: No results found for this basename: AST, ALT, ALKPHOS, BILITOT, PROT, ALBUMIN,  in the last 168 hours No results found for this basename: LIPASE, AMYLASE,  in the last 168 hours No results found for this basename: AMMONIA,  in the last 168 hours CBC:  Recent Labs Lab 09/17/13 1259  09/20/13 0936  09/21/13 0258  WBC 5.4  < > 5.2 5.7  NEUTROABS 3.4  --   --   --   HGB 4.8*  < > 7.3* 7.3*  HCT 15.5*  < > 23.5* 22.9*  MCV 89.6  < > 91.8 91.2  PLT 216  < > 159 154  < > = values in this interval not displayed. Cardiac Enzymes:  Recent Labs Lab 09/17/13 1259 09/18/13 1030  TROPONINI <0.30 <0.30   BNP:  Recent Labs Lab 09/17/13 1259  PROBNP 3720.0*   D-Dimer: No results found for this basename: DDIMER,  in the last 168 hours CBG:  Recent Labs Lab 09/18/13 0506 09/18/13 0724 09/19/13 0726 09/19/13 1152 09/20/13 0744 09/21/13 0753  GLUCAP 115* 109* 90 109* 90 83  Coagulation:  Recent Labs Lab 09/18/13 0220  LABPROT 17.9*  INR 1.48  Urinalysis:  Recent Labs Lab 09/17/13 1351  COLORURINE YELLOW  LABSPEC 1.012  PHURINE 6.0  GLUCOSEU NEGATIVE  HGBUR NEGATIVE  BILIRUBINUR NEGATIVE  [place] NEGATIVE  UROBILINOGEN  4.0*  NITRITE NEGATIVE  LEUKOCYTESUR TRACE*   Misc. Labs:   Micro Results: Recent Results (from the past 240 hour(s))  MRSA PCR SCREENING     Status: None   Collection Time    09/17/13  6:43 PM      Result Value Ref Range Status   MRSA by PCR NEGATIVE  NEGATIVE Final   Comment:            The GeneXpert MRSA Assay (FDA     approved for NASAL specimens     only), is one component of a     comprehensive MRSA colonization     surveillance program. It is not     intended to diagnose MRSA     infection nor to guide or     monitor treatment for     MRSA infections.   Studies/Results: No results found. Medications: I have reviewed the patient's current medications. Scheduled Meds:  Continuous Infusions:  PRN Meds:. Assessment/Plan: Principal Problem:   Acute symptomatic blood loss anemia Active Problems:   Hypotension   Hypokalemia   Acute on chronic diastolic CHF (congestive heart failure)   Acute GI bleeding   Increased anion gap metabolic acidosis  Acute blood loss anemia likely 2/2 to GI Bleed    Patient not longer having melena. With hx of diverticular bleed, this is likely recurrence of that.  - GI consulted,  Capsule study was done showing gastritis and esophagitis.  - transfused 3 units PRBC's and Hgb currently stable around 7.3 - was on IV protonix --> will send her with PO protonix BID for 1 month.  - made f/up appointment with GI at [place].  Diastolic CHF- ECHO TEE 5/36/14 EF 60-65%  Has some crackles on bases and JVD .BNP is elevated. Volume management difficult with low BP - diuresing well. Lung is more clear. Leg swelling is better. - daily weights, I/O.  - cont spironolatone 25 mg daily to BID + demadex 80mg  BID per card recs. -Her BP is holding up well with the diuretics.  - has lot of room for diueresis still as she is 10 lb more than her dry weight. Will cut down on diuresis as needed outpatient.  - HF team ok with d/c today  Hypokalemia  - replete K+ as needed, has low K+ chronically.  - spirolactone 25mg  BID. Hypernantremia -likely 2/2 to volume overload. Improved with diuresis. PPX: SCD.  Diet: HH diet.  Code: full  Dispo: Disposition is deferred at this time, awaiting improvement of current medical problems.  Anticipated discharge in approximately 2-3 day(s).   The patient does have a current PCP Rubie Maid, MD) and does need an [facility] hospital follow-up appointment after discharge.  The patient does not know have transportation limitations that hinder transportation to clinic appointments.  .Services Needed at time of discharge: Y = Yes, Blank = No PT:   OT:   RN:   Equipment:   Other:     LOS: 4 days   Dellia Nims, MD 09/21/2013, 8:23 PM

## 2013-09-21 NOTE — Discharge Summary (Signed)
Name: Nicole Hansen MRN: [id number] DOB: [birth year] 73 y.o. PCP: Rubie Maid, MD  Date of Admission: 09/17/2013 12:20 PM Date of Discharge: 09/21/2013 Attending Physician: [name], MD  Discharge Diagnosis:  Principal Problem:   Acute symptomatic blood loss anemia Active Problems:   Hypotension   Hypokalemia   Acute on chronic diastolic CHF (congestive heart failure)   Acute GI bleeding   Increased anion gap metabolic acidosis  Discharge Medications:   Medication List         acetaminophen 325 MG tablet  Commonly known as:  TYLENOL  Take 650 mg by mouth every 6 (six) hours as needed (pain).     ferrous sulfate 325 (65 FE) MG tablet  Take 325 mg by mouth 3 (three) times daily with meals.     gabapentin 300 MG capsule  Commonly known as:  NEURONTIN  Take 300 mg by mouth at bedtime.     levothyroxine 200 MCG tablet  Commonly known as:  SYNTHROID, LEVOTHROID  Take 1 tablet (200 mcg total) by mouth daily before breakfast.     LORazepam 1 MG tablet  Commonly known as:  ATIVAN  Take 1 mg by mouth at bedtime as needed for anxiety or sleep. sleep     pantoprazole 40 MG tablet  Commonly known as:  PROTONIX  Take 1 tablet (40 mg total) by mouth 2 (two) times daily.     potassium chloride SA 20 MEQ tablet  Commonly known as:  K-DUR,KLOR-CON  Take 20 mEq by mouth 3 (three) times daily.     sorbitol 70 % Soln  Take 15 mLs by mouth 2 (two) times daily as needed for moderate constipation.     spironolactone 25 MG tablet  Commonly known as:  ALDACTONE  Take 1 tablet (25 mg total) by mouth 2 (two) times daily.     torsemide 20 MG tablet  Commonly known as:  DEMADEX  Take 4 tablets (80 mg total) by mouth 2 (two) times daily.        Disposition and follow-up:   Nicole Hansen was discharged from [facility] in Good condition.  At the hospital follow up visit please address:  1.  Please check her TSH, it was elevated on 07/2013. We kept her on  her home dose of levothyroxine 230mcg daily in the hospital.  2.  Labs / imaging needed at time of follow-up:   3.  Pending labs/ test needing follow-up:   Follow-up Appointments:     Follow-up Information   Follow up with Glori Bickers, MD On 09/27/2013. (You have appointment at the [facility] on 8/20 at 9:15am)    Specialty:  Cardiology   Contact information:   [place] [address] [phone]       Please follow up. (they will call you with an appointment for next week. )    Contact information:   [facility] [address] Kristeen Mans [address] [place] [address] [phone]       Discharge Instructions: Discharge Instructions   Diet - low sodium heart healthy    Complete by:  As directed      Increase activity slowly    Complete by:  As directed            Consultations: Treatment Team:  Rounding Lbcardiology, MD  Procedures Performed:  Dg Chest 2 View  08/28/2013   CLINICAL DATA:  Weakness.  EXAM: CHEST  2 VIEW  COMPARISON:  CT 07/10/2013.  Chest x-ray 07/09/2013.  FINDINGS: Mediastinum and hilar structures normal. Prior CABG. Prior aortic valve replacement. Cardiomegaly with pulmonary vascular prominence and interstitial prominence with bilateral pleural effusions right side greater than left. Findings consistent with congestive heart failure. No pneumothorax. No acute bony abnormality.  IMPRESSION: 1. Congestive heart failure with pulmonary interstitial edema and pleural effusions.  2.  Prior CABG.  Aortic valve replacement.   Electronically Signed   By: [place]   On: 08/28/2013 13:29   Ct Head Wo Contrast  09/17/2013   CLINICAL DATA:  Dizziness and weakness.  EXAM: CT HEAD WITHOUT CONTRAST  TECHNIQUE: Contiguous axial images were obtained from the base of the skull through the vertex without intravenous contrast.  COMPARISON:  05/17/2013  FINDINGS: Ventricles, cisterns and other CSF  spaces are within normal. There is no mass, mass effect, shift of midline structures or acute hemorrhage. No evidence of acute infarction. Remaining bones and soft tissues are within normal.  IMPRESSION: No acute intracranial findings.   Electronically Signed   By: Marin Olp M.D.   On: 09/17/2013 13:52   Dg Chest Port 1 View  09/17/2013   CLINICAL DATA:  One-day history of shortness of breath with a history of CHF and anemia  EXAM: PORTABLE CHEST - 1 VIEW  COMPARISON:  PA and lateral chest x-ray of August 28, 2013  FINDINGS: The lungs are adequately inflated. There is no focal infiltrate. The interstitial markings are mildly increased. The costophrenic angles remain sharp. The cardiac silhouette is mildly enlarged and the central pulmonary vascularity is engorged. The patient has undergone previous CABG. There are mild degenerative changes of the left shoulder.  IMPRESSION: Congestive heart failure with mild pulmonary interstitial edema. The pleural effusion on the right has cleared since the study of August 28, 2013.   Electronically Signed   By: David  [place]   On: 09/17/2013 17:56    2D Echo:   Cardiac Cath:   Admission HPI:   73 yo with Diastolic HF, AI s/p AVR, recent diverticular bleed who rpesents with 2 days of general fatigue and malaise. She hasn't been doing well since Saturday and it progressively worsened until today. She could barely talk to her family. She felt tired and weak. She noticed black stool for 2 days (dark black). No hematochezia, no hempytisis. Denies abdominal pain. Denies SOB/chest pain. Has been feeling dizzy. Her admission Hgb was 4.8 and SBP 70. PCCM as called. She received 1 unit blood and BP raised to 90's. Her mental status improved significantly and she felt like she was getting close to her baseline. Still feels tired but is more alert now and feels better. Denies fever. Has some dry cough.    Hospital Course by problem list:  Acute blood loss anemia likely 2/2  to GI Bleed  Patient Patient not longer having melena.- transfused 3 units PRBC's and Hgb currently stable around 7.3 - GI consulted, Capsule study was done showing gastritis and esophagitis.  - was on IV protonix --> will send her with PO protonix BID for 1 month.  - made f/up appointment with GI at [place]. Office will call her.  Diastolic CHF- ECHO TEE 5/32/99 EF 60-65%  Has some crackles on bases and JVD .BNP is elevated. Volume management difficult with low BP initially. BP improved with transfusion. - diuresis was started HF team recs. Diuresed well with spironolactone and demadex. Lung is more clear. Leg swelling is better.  - will cont spironolatone  25 mg daily to BID + demadex 80mg  BID per card recs. -Her BP is holding up well with the diuretics.  - has lot of room for diueresis still as she is 10 lb more than her dry weight. Will cut down on diuresis as needed outpatient. - HF team ok with d/c today   Hypokalemia  - replete K+ as needed, has low K+ chronically.  - spirolactone 25mg  BID.  Hypernantremia -likely 2/2 to volume overload. Improved with diuresis.   Discharge Vitals:   BP 106/52  Pulse 88  Temp(Src) 98.6 F (37 C) (Oral)  Resp 15  Ht 5\' 8"  (1.727 m)  Wt 80.604 kg (177 lb 11.2 oz)  BMI 27.03 kg/m2  SpO2 95%  Discharge Labs:  Results for orders placed during the hospital encounter of 09/17/13 (from the past 24 hour(s))  BASIC METABOLIC PANEL     Status: Abnormal   Collection Time    09/21/13  2:58 AM      Result Value Ref Range   Sodium 133 (*) 137 - 147 mEq/L   Potassium 3.7  3.7 - 5.3 mEq/L   Chloride 92 (*) 96 - 112 mEq/L   CO2 26  19 - 32 mEq/L   Glucose, Bld 90  70 - 99 mg/dL   BUN 21  6 - 23 mg/dL   Creatinine, Ser 0.96  0.50 - 1.10 mg/dL   Calcium 7.8 (*) 8.4 - 10.5 mg/dL   GFR calc non Af Amer 57 (*) >90 mL/min   GFR calc Af Amer 66 (*) >90 mL/min   Anion gap 15  5 - 15  CBC     Status: Abnormal   Collection Time    09/21/13  2:58 AM       Result Value Ref Range   WBC 5.7  4.0 - 10.5 K/uL   RBC 2.51 (*) 3.87 - 5.11 MIL/uL   Hemoglobin 7.3 (*) 12.0 - 15.0 g/dL   HCT 22.9 (*) 36.0 - 46.0 %   MCV 91.2  78.0 - 100.0 fL   MCH 29.1  26.0 - 34.0 pg   MCHC 31.9  30.0 - 36.0 g/dL   RDW 16.2 (*) 11.5 - 15.5 %   Platelets 154  150 - 400 K/uL  GLUCOSE, CAPILLARY     Status: None   Collection Time    09/21/13  7:53 AM      Result Value Ref Range   Glucose-Capillary 83  70 - 99 mg/dL    Signed: Dellia Nims, MD 09/21/2013, 2:14 PM    Services Ordered on Discharge:  Equipment Ordered on Discharge:

## 2013-09-21 NOTE — Discharge Instructions (Addendum)
It was a pleasure taking care of you. You were admitted with gastrointestinal bleed. EGD shows it might be from your esophagitis or gastritis. You will need to take acid reducing medication for at least 1 month. Avoid any NSAIDS such as ibuprofen or advil, motrin, etc. You can take colace as a stool softener.  You need to follow up with a GI doctor outpatient. They will call you to make appointment.  Gastrointestinal Bleeding Gastrointestinal (GI) bleeding means there is bleeding somewhere along the digestive tract, between the mouth and anus. CAUSES  There are many different problems that can cause GI bleeding. Possible causes include:  Esophagitis. This is inflammation, irritation, or swelling of the esophagus.  Hemorrhoids.These are veins that are full of blood (engorged) in the rectum. They cause pain, inflammation, and may bleed.  Anal fissures.These are areas of painful tearing which may bleed. They are often caused by passing hard stool.  Diverticulosis.These are pouches that form on the colon over time, with age, and may bleed significantly.  Diverticulitis.This is inflammation in areas with diverticulosis. It can cause pain, fever, and bloody stools, although bleeding is rare.  Polyps and cancer. Colon cancer often starts out as precancerous polyps.  Gastritis and ulcers.Bleeding from the upper gastrointestinal tract (near the stomach) may travel through the intestines and produce black, sometimes tarry, often bad smelling stools. In certain cases, if the bleeding is fast enough, the stools may not be black, but red. This condition may be life-threatening. SYMPTOMS   Vomiting bright red blood or material that looks like coffee grounds.  Bloody, black, or tarry stools. DIAGNOSIS  Your caregiver may diagnose your condition by taking your history and performing a physical exam. More tests may be needed, including:  X-rays and other imaging tests.  Esophagogastroduodenoscopy  (EGD). This test uses a flexible, lighted tube to look at your esophagus, stomach, and small intestine.  Colonoscopy. This test uses a flexible, lighted tube to look at your colon. TREATMENT  Treatment depends on the cause of your bleeding.   For bleeding from the esophagus, stomach, small intestine, or colon, the caregiver doing your EGD or colonoscopy may be able to stop the bleeding as part of the procedure.  Inflammation or infection of the colon can be treated with medicines.  Many rectal problems can be treated with creams, suppositories, or warm baths.  Surgery is sometimes needed.  Blood transfusions are sometimes needed if you have lost a lot of blood. If bleeding is slow, you may be allowed to go home. If there is a lot of bleeding, you will need to stay in the hospital for observation. HOME CARE INSTRUCTIONS   Take any medicines exactly as prescribed.  Keep your stools soft by eating foods that are high in fiber. These foods include whole grains, legumes, fruits, and vegetables. Prunes (1 to 3 a day) work well for many people.  Drink enough fluids to keep your urine clear or pale yellow. SEEK IMMEDIATE MEDICAL CARE IF:   Your bleeding increases.  You feel lightheaded, weak, or you faint.  You have severe cramps in your back or abdomen.  You pass large blood clots in your stool.  Your problems are getting worse. MAKE SURE YOU:   Understand these instructions.  Will watch your condition.  Will get help right away if you are not doing well or get worse. Document Released: 01/23/2000 Document Revised: 01/12/2012 Document Reviewed: 01/04/2011 [facility] Patient Information 2015 [place], [place]. This information is not intended to replace  advice given to you by your health care provider. Make sure you discuss any questions you have with your health care provider.

## 2013-09-21 NOTE — Progress Notes (Signed)
Physical Therapy Treatment Patient Details Name: Nicole Hansen MRN: [id number] DOB: 16-Mar-1940 Today's Date: 09/21/2013    History of Present Illness Nicole Hansen is a 73 year old woman with dHF, AI s/p AVR, and recent hospitalization for diverticular bleed who presents with two days of general fatigue and malaise. She was recently hospitalized in 08/2013 with a diverticular bleed that required 5 U PRBC transfusion during her admission.      PT Comments    Patient continues to progress with mobility and activity tolerance. Patient performed stair negotiation with rails. Ambulated increased distance and tolerated there-ex. Will continue to see as indicated and progress as tolerated.   Follow Up Recommendations  Supervision for mobility/OOB     Equipment Recommendations  None recommended by PT    Recommendations for Other Services       Precautions / Restrictions Precautions Precautions: Fall Restrictions Weight Bearing Restrictions: No    Mobility  Bed Mobility Overal bed mobility: Needs Assistance Bed Mobility: Rolling;Sidelying to Sit Rolling: Modified independent (Device/Increase time) Sidelying to sit: Modified independent (Device/Increase time)       General bed mobility comments: Increased time to perform  Transfers Overall transfer level: Needs assistance Equipment used: Rolling walker (2 wheeled);None   Sit to Stand: Supervision Stand pivot transfers: Supervision       General transfer comment: some instability noted with transfer when not using assistive device  Ambulation/Gait Ambulation/Gait assistance: Supervision Ambulation Distance (Feet): 620 Feet (seated rest break at 300 ft) Assistive device: Rolling walker (2 wheeled);None Gait Pattern/deviations: Step-through pattern;Decreased stride length;Trunk flexed;Narrow base of support Gait velocity: decreased Gait velocity interpretation: Below normal speed for age/gender General Gait Details: steady with  use of RW   Stairs Stairs: Yes Stairs assistance: Min guard Stair Management: Two rails;Alternating pattern Number of Stairs: 12 General stair comments: guard for safety   Wheelchair Mobility    Modified Rankin (Stroke Patients Only)       Balance             Standing balance-Leahy Scale: Fair                      Cognition Arousal/Alertness: Awake/alert Behavior During Therapy: WFL for tasks assessed/performed Overall Cognitive Status: Within Functional Limits for tasks assessed                      Exercises General Exercises - Lower Extremity Ankle Circles/Pumps: AROM;10 reps Long Arc Quad: AROM;10 reps    General Comments        Pertinent Vitals/Pain      Home Living                      Prior Function            PT Goals (current goals can now be found in the care plan section) Acute Rehab PT Goals Patient Stated Goal: "to get better" PT Goal Formulation: With patient/family Time For Goal Achievement: 10/03/13 Potential to Achieve Goals: Good    Frequency  Min 4X/week    PT Plan Current plan remains appropriate    Co-evaluation             End of Session Equipment Utilized During Treatment: Gait belt Activity Tolerance: Patient tolerated treatment well Patient left: in chair;with call bell/phone within reach;with family/visitor present     Time: 1145-1209 PT Time Calculation (min): 24 min  Charges:  $Gait Training: 23-37 mins  G CodesDuncan Dull 2013/09/28, 12:15 PM Alben Deeds, [place] DPT  [phone]

## 2013-09-21 NOTE — Progress Notes (Signed)
  Date: 09/21/2013  Patient name: Nicole Hansen record number: [id number]  Date of birth: 1940-06-28   This patient has been seen and the plan of care was discussed with the house staff. Please see their note for complete details. I concur with their findings with the following additions/corrections:  Patient is no longer having gi blood loss. Capsule study found to have evidence of gastritis and esophagitis, with recs to do 1 month of ppi, with thought as the etiology of melena. hct has been stable. Appreciate CHF consultation helping with diuretic regimen. Patient is on,  demadex 80 bid, spiro 25 daily and kcl 20 tid. Will discharge her on this regimen for her to follow up with them as an outpatient. She is stable for discharge home today   [name], MD 09/21/2013, 2:54 PM

## 2013-09-21 NOTE — Progress Notes (Signed)
Advanced Heart Failure Team Rounding Note    HPI:    73 yo female with history of bioprosthetic AVR, diastolic CHF and GI bleeding admitted with recurrent GI bleed with Hgb 4. We are consulted for management of HF.  Recently discharged from hospital after treatment for GI and recurrent HF. Discharge weight was 174. I saw her in clinic on 8/5 and was doing well on oral diuretics. Weight down to 169.   Admitted with recurrent melena and weakness. Hgb found to be 4.8. Has been transfused. Hgb now 7.4 . No melena x 3 days.   No complaints this am. No further bleeding noted. Capsule endo reportedly showed esophageal source of bleeding. Hgb stable.  Home demadex restarted yesterday.  Good output. Weight down another 5 pounds. No dyspnea    Objective:    Vital Signs:   Temp:  [97.8 F (36.6 C)-98.6 F (37 C)] 98.6 F (37 C) (08/14 0800) Pulse Rate:  [89-98] 89 (08/14 0400) Resp:  [10-22] 10 (08/14 0400) BP: (96-109)/(52-84) 96/54 mmHg (08/14 0319) SpO2:  [86 %-100 %] 100 % (08/14 0400) Last BM Date: 09/20/13 Filed Weights   09/19/13 0500 09/20/13 0455 09/20/13 1000  Weight: 84.3 kg (185 lb 13.6 oz) 82.9 kg (182 lb 12.2 oz) 80.604 kg (177 lb 11.2 oz)    Physical Exam: General: sittingin chair NAD HEENT: normal  Neck: supple. JVP 10 + prominent CV waves . Carotids 2+ bilaterally; no bruits. No lymphadenopathy or thryomegaly appreciated.  Cor: PMI normal. Regular rate & rhythm. No rubs, gallops 2/6 SEM RUSB  Lungs: clear  Abdomen: soft, nontender, nondistended. No hepatosplenomegaly. No bruits or masses. Good bowel sounds.  Extremities: no cyanosis, clubbing, rash, R and LLE 2+ edema into thighs +SCDs Neuro: alert & orientedx3, cranial nerves grossly intact. Moves all 4 extremities w/o difficulty. Affect pleasant.   Telemetry: NSR 80-90s  Labs: Basic Metabolic Panel:  Recent Labs Lab 09/18/13 0220 09/18/13 1800 09/19/13 0248 09/19/13 0830 09/20/13 0425 09/20/13 0936  09/21/13 0258  NA 129* 129* 125* 125* 129*  --  133*  K 3.2* 3.4* 3.4* 3.7 3.5*  --  3.7  CL 87* 88* 85* 85* 90*  --  92*  CO2 28 26 27 26 26   --  26  GLUCOSE 102* 119* 90 115* 90  --  90  BUN 39* 32* 28* 28* 24*  --  21  CREATININE 0.90 0.94 0.89 0.90 0.87  --  0.96  CALCIUM 8.2* 8.2* 8.0* 8.2* 7.8*  --  7.8*  MG 2.4  --   --   --   --  2.1  --     Liver Function Tests: No results found for this basename: AST, ALT, ALKPHOS, BILITOT, PROT, ALBUMIN,  in the last 168 hours No results found for this basename: LIPASE, AMYLASE,  in the last 168 hours No results found for this basename: AMMONIA,  in the last 168 hours  CBC:  Recent Labs Lab 09/17/13 1259  09/18/13 1800 09/19/13 0830 09/19/13 1835 09/20/13 0936 09/21/13 0258  WBC 5.4  < > 7.1 5.5 6.1 5.2 5.7  NEUTROABS 3.4  --   --   --   --   --   --   HGB 4.8*  < > 7.6* 7.6* 7.7* 7.3* 7.3*  HCT 15.5*  < > 23.3* 23.0* 23.9* 23.5* 22.9*  MCV 89.6  < > 87.3 89.5 87.2 91.8 91.2  PLT 216  < > 184 175 179 159 154  < > =  values in this interval not displayed.  Cardiac Enzymes:  Recent Labs Lab 09/17/13 1259 09/18/13 1030  TROPONINI <0.30 <0.30    BNP: BNP (last 3 results)  Recent Labs  07/24/13 0610 08/28/13 1330 09/17/13 1259  PROBNP 837.4* 2110.0* 3720.0*    CBG:  Recent Labs Lab 09/18/13 0724 09/19/13 0726 09/19/13 1152 09/20/13 0744 09/21/13 0753  GLUCAP 109* 90 109* 90 83    Coagulation Studies: No results found for this basename: LABPROT, INR,  in the last 72 hours  Other results: EKG: NSR 88 RBBB 1AVB  No ST-T wave abnormalities.    Imaging: No results found.      Assessment:  73 yo with history of bioprosthetic AVR and diastolic CHF presented with recurrent GI bleeding and chronic diastolic CHF.   1. Acute blood loss anemia:  2. Chronic diastolic CHF: R>L . EF 55-60% on 6/15 echo.  3. Bioprosthetic aortic valve:  Mean gradient 20  mmHg suggests perhaps mild to moderate AS from  patient-prosthetic mismatch.  4. Mitral stenosis: TEEshowed mild to moderate MR with heavy MAC and a restricted posterior leaflet. The mean gradient across the valve was 10 mmHg but MVA by PHT was > 2.  5. Hypokalemia - this has been persistent, severe problem for her. Will use spironolactone judiciously to help.  6. Hyponatremia  Plan/Discussion:    Responding well to home demadex regimen. BP stable. She is still about 10 pounds from dry weight but diuresing well on home regimen. From cardiac standpoint ok to send home on home diuretic regimen. Of demadex 80 bid, spiro 25 daily and kcl 20 tid.   We will see her back in HF Clinic on 8/20 at 9:15a  Length of Stay: 4 Glori Bickers MD 09/21/2013, 12:21 PM Advanced Heart Failure Team Pager [phone] (M-F; 7a - 4p)  Please contact [facility] for night-coverage after hours (4p -7a ) and weekends on [email or web address]

## 2013-09-24 ENCOUNTER — Telehealth (HOSPITAL_COMMUNITY): Payer: Self-pay | Admitting: Vascular Surgery

## 2013-09-24 NOTE — Telephone Encounter (Signed)
Per Dr Haroldine Laws decrease torsemide to 40 mg Twice daily keep f/u appt on Thur, if wt increases call back, pt is aware and agreeable

## 2013-09-24 NOTE — Telephone Encounter (Signed)
Pt left message she has been feeling dizzy.Marland Kitchen Please advise

## 2013-09-26 NOTE — Progress Notes (Signed)
Patient ID: Nicole Hansen, female   DOB: [birth year], 73 y.o.   MRN: [id number] Primary Cardiologist: Dr Wyline Copas PCP: Dr Bebe Shaggy GI: [facility]  HPI: Nicole Hansen is a 73 yo female with a history of AI s/p AVR (bovine), hypothyroidism s/p thyroidectomy, HTN, obesity, diastolic HF and recurrent anemia.   Admitted to [facility] June 1st  through June 18th with volume overload with lasix drip and metolazone. Diuresed 42 pounds and transitioned to torsemide 40 mg daily. Discharge weight was 173 pounds.  Readmitted in July 2015 for recurrent HF and recurrent diverticular bleeding. Diuresed. Transfused 5u PRBCs. Was placed on sorbitol to help soften stools. Has had negative EGD and colon recently. Rending capsule endo. Weight on d/c was 174.   Most recent admit was 09/17/2013 with hemoglobin 4.8. Received 4UPRBC. Had capsule Endoscopy. Diuresed with lasix drip and transitioned to torsemide 80 mg twice a day. Discharge weight was 177 pounds.   She returns for follow up. Complains of fatigue. Denies SOB/dizziness.  Sleeps on 2 pillows. Denies BRBPR. Has follow up with GI next week. Weight at home 162-164 pounds. aking all medications. Watching fluid and salt intake at home.  No dizziness. No further GI bleeding. [facility]   Labs 07/30/13 K 5.1 Creatine 0.87 Labs 07/26/13 K 4.2 Creatinine 0.87 Labs 09/07/13 K 3.4 Cr 1.02 Hgb 7.8 Labs 09/21/13 K 3.7 Creatinine 0.96  FH: Mother deceased: HTN, AAA  Father deceased: HTN, CHF  SH: Lives with husband in [place], No ETOH or tobacco abuse.  ROS: All systems negative except as listed in HPI, PMH and Problem List.  Past Medical History  Diagnosis Date  . CHF (congestive heart failure)   . Thyroid disease   . Neuropathy   . Diverticular disease     Current Outpatient Prescriptions  Medication Sig Dispense Refill  . acetaminophen (TYLENOL) 325 MG tablet Take 650 mg by mouth every 6 (six) hours as needed (pain).       Marland Kitchen docusate sodium (COLACE) 100 MG capsule Take 1  capsule (100 mg total) by mouth 2 (two) times daily.  60 capsule  0  . ferrous sulfate 325 (65 FE) MG tablet Take 325 mg by mouth 3 (three) times daily with meals.       . gabapentin (NEURONTIN) 300 MG capsule Take 300 mg by mouth at bedtime.       Marland Kitchen levothyroxine (SYNTHROID, LEVOTHROID) 200 MCG tablet Take 1 tablet (200 mcg total) by mouth daily before breakfast.  30 tablet  1  . LORazepam (ATIVAN) 1 MG tablet Take 1 mg by mouth at bedtime as needed for anxiety or sleep. sleep      . pantoprazole (PROTONIX) 40 MG tablet Take 1 tablet (40 mg total) by mouth 2 (two) times daily.  60 tablet  0  . potassium chloride SA (K-DUR,KLOR-CON) 20 MEQ tablet Take 20 mEq by mouth 3 (three) times daily.       . sorbitol 70 % SOLN Take 15 mLs by mouth 2 (two) times daily as needed for moderate constipation.  500 mL  1  . spironolactone (ALDACTONE) 25 MG tablet Take 1 tablet (25 mg total) by mouth 2 (two) times daily.  30 tablet  0  . torsemide (DEMADEX) 20 MG tablet Take 4 tablets (80 mg total) by mouth 2 (two) times daily.  48 tablet  0   No current facility-administered medications for this encounter.     PHYSICAL EXAM: Filed Vitals:   09/27/13 0922  BP: 92/44  Pulse: 89  Resp: 18  Weight: 172 lb 8 oz (78.245 kg)  SpO2: 98%    General:  Pale chronically ill appearing. No resp difficulty Daughter present  HEENT: normal Neck: supple. JVP 9-10 + prominent CV waves  . Carotids 2+ bilaterally; no bruits. No lymphadenopathy or thryomegaly appreciated. Cor: PMI normal. Regular rate & rhythm. No rubs, gallops 2/6 SEM RUSB Lungs: clear Abdomen: soft, nontender, nondistended. No hepatosplenomegaly. No bruits or masses. Good bowel sounds. Extremities: no cyanosis, clubbing, rash, R and LLE 1-2+  Woody edema to knees  Neuro: alert & orientedx3, cranial nerves grossly intact. Moves all 4 extremities w/o difficulty. Affect pleasant.   ASSESSMENT & PLAN: 1. Chronic Diastolic HF.  R>L --Complaining fatigue.  Volume status mildly elevated but she has not had medications today.  On torsemide 80 mg twice a day and 25 mg spironolactone twice a day.  --Check BMET and pro bnp now.  Pro BNP 4532  --2. Anemia d/t GI bleeding- Capsule Study --with gastritis and esophagitis.  Complaining of fatigue. Check CBC now. No BRBPR.  Hemoglobin 6.1 down over 1 gram..  3. Hypokalemia: Check BMET now. K 2.7 did not have potassium today. Given 60 meq of potassium   K 2.7 Creatinine 1.01  Pro BNP 4532 . ---> Given 60 meq of potassum in HF clinic. Hemoglobin 6.1 down again. Send to [facility] ED now for an evaluation. Marland Kitchen    Nicole Sanmiguel NP-C  9:32 AM

## 2013-09-27 ENCOUNTER — Encounter (HOSPITAL_COMMUNITY): Payer: Self-pay | Admitting: Emergency Medicine

## 2013-09-27 ENCOUNTER — Inpatient Hospital Stay (HOSPITAL_COMMUNITY)
Admission: EM | Admit: 2013-09-27 | Discharge: 2013-09-28 | DRG: 377 | Disposition: A | Discharge status: Home / Self Care | Payer: Medicare Other | Attending: Internal Medicine | Admitting: Internal Medicine

## 2013-09-27 ENCOUNTER — Ambulatory Visit (HOSPITAL_BASED_OUTPATIENT_CLINIC_OR_DEPARTMENT_OTHER)
Admit: 2013-09-27 | Discharge: 2013-09-27 | Disposition: A | Discharge status: Home / Self Care | Payer: Medicare Other | Attending: Internal Medicine | Admitting: Internal Medicine

## 2013-09-27 ENCOUNTER — Emergency Department (HOSPITAL_COMMUNITY): Payer: Medicare Other

## 2013-09-27 VITALS — BP 92/44 | HR 89 | Resp 18 | Wt 172.5 lb

## 2013-09-27 DIAGNOSIS — K922 Gastrointestinal hemorrhage, unspecified: Secondary | ICD-10-CM | POA: Diagnosis present

## 2013-09-27 DIAGNOSIS — Z954 Presence of other heart-valve replacement: Secondary | ICD-10-CM | POA: Diagnosis not present

## 2013-09-27 DIAGNOSIS — I359 Nonrheumatic aortic valve disorder, unspecified: Secondary | ICD-10-CM | POA: Diagnosis present

## 2013-09-27 DIAGNOSIS — R5383 Other fatigue: Secondary | ICD-10-CM | POA: Diagnosis present

## 2013-09-27 DIAGNOSIS — R5381 Other malaise: Secondary | ICD-10-CM | POA: Diagnosis present

## 2013-09-27 DIAGNOSIS — I959 Hypotension, unspecified: Secondary | ICD-10-CM | POA: Diagnosis present

## 2013-09-27 DIAGNOSIS — I5032 Chronic diastolic (congestive) heart failure: Secondary | ICD-10-CM

## 2013-09-27 DIAGNOSIS — E876 Hypokalemia: Secondary | ICD-10-CM | POA: Diagnosis present

## 2013-09-27 DIAGNOSIS — K767 Hepatorenal syndrome: Secondary | ICD-10-CM | POA: Diagnosis present

## 2013-09-27 DIAGNOSIS — E039 Hypothyroidism, unspecified: Secondary | ICD-10-CM | POA: Diagnosis present

## 2013-09-27 DIAGNOSIS — I350 Nonrheumatic aortic (valve) stenosis: Secondary | ICD-10-CM | POA: Diagnosis present

## 2013-09-27 DIAGNOSIS — D500 Iron deficiency anemia secondary to blood loss (chronic): Secondary | ICD-10-CM | POA: Diagnosis present

## 2013-09-27 DIAGNOSIS — D649 Anemia, unspecified: Secondary | ICD-10-CM | POA: Diagnosis present

## 2013-09-27 DIAGNOSIS — Z87891 Personal history of nicotine dependence: Secondary | ICD-10-CM | POA: Diagnosis not present

## 2013-09-27 HISTORY — DX: Basal cell carcinoma of skin, unspecified: C44.91

## 2013-09-27 HISTORY — DX: Atherosclerotic heart disease of native coronary artery without angina pectoris: I25.10

## 2013-09-27 HISTORY — DX: Anemia, unspecified: D64.9

## 2013-09-27 HISTORY — DX: Unspecified osteoarthritis, unspecified site: M19.90

## 2013-09-27 HISTORY — DX: Polyneuropathy, unspecified: G62.9

## 2013-09-27 HISTORY — DX: Personal history of other medical treatment: Z92.89

## 2013-09-27 HISTORY — DX: Other specified postprocedural states: R11.2

## 2013-09-27 HISTORY — DX: Hypothyroidism, unspecified: E03.9

## 2013-09-27 HISTORY — DX: Other specified postprocedural states: Z98.890

## 2013-09-27 HISTORY — DX: Gastro-esophageal reflux disease without esophagitis: K21.9

## 2013-09-27 LAB — PRO B NATRIURETIC PEPTIDE: Pro B Natriuretic peptide (BNP): 4532 pg/mL — ABNORMAL HIGH (ref 0–125)

## 2013-09-27 LAB — BASIC METABOLIC PANEL
Anion gap: 19 — ABNORMAL HIGH (ref 5–15)
BUN: 38 mg/dL — ABNORMAL HIGH (ref 6–23)
CHLORIDE: 89 meq/L — AB (ref 96–112)
CO2: 24 meq/L (ref 19–32)
Calcium: 8.7 mg/dL (ref 8.4–10.5)
Creatinine, Ser: 1.01 mg/dL (ref 0.50–1.10)
GFR calc Af Amer: 62 mL/min — ABNORMAL LOW (ref >90)
GFR calc non Af Amer: 54 mL/min — ABNORMAL LOW (ref >90)
GLUCOSE: 111 mg/dL — AB (ref 70–99)
Potassium: 2.7 mEq/L — CL (ref 3.7–5.3)
SODIUM: 132 meq/L — AB (ref 137–147)

## 2013-09-27 LAB — CBC
HCT: 22.3 % — ABNORMAL LOW (ref 36.0–46.0)
HEMATOCRIT: 19.2 % — AB (ref 36.0–46.0)
HEMOGLOBIN: 6.1 g/dL — AB (ref 12.0–15.0)
Hemoglobin: 7.3 g/dL — ABNORMAL LOW (ref 12.0–15.0)
MCH: 28.2 pg (ref 26.0–34.0)
MCH: 29.2 pg (ref 26.0–34.0)
MCHC: 31.8 g/dL (ref 30.0–36.0)
MCHC: 32.7 g/dL (ref 30.0–36.0)
MCV: 88.9 fL (ref 78.0–100.0)
MCV: 89.2 fL (ref 78.0–100.0)
PLATELETS: 154 10*3/uL (ref 150–400)
Platelets: 145 10*3/uL — ABNORMAL LOW (ref 150–400)
RBC: 2.16 MIL/uL — ABNORMAL LOW (ref 3.87–5.11)
RBC: 2.5 MIL/uL — AB (ref 3.87–5.11)
RDW: 17.2 % — ABNORMAL HIGH (ref 11.5–15.5)
RDW: 17.2 % — ABNORMAL HIGH (ref 11.5–15.5)
WBC: 6 10*3/uL (ref 4.0–10.5)
WBC: 6.8 10*3/uL (ref 4.0–10.5)

## 2013-09-27 LAB — RETICULOCYTES
RBC.: 1.37 MIL/uL — ABNORMAL LOW (ref 3.87–5.11)
RETIC COUNT ABSOLUTE: 106.9 10*3/uL (ref 19.0–186.0)
Retic Ct Pct: 7.8 % — ABNORMAL HIGH (ref 0.4–3.1)

## 2013-09-27 LAB — LACTATE DEHYDROGENASE: LDH: 183 U/L (ref 94–250)

## 2013-09-27 LAB — HAPTOGLOBIN: Haptoglobin: 173 mg/dL (ref 45–215)

## 2013-09-27 LAB — SAVE SMEAR

## 2013-09-27 LAB — PREPARE RBC (CROSSMATCH)

## 2013-09-27 MED ORDER — GABAPENTIN 300 MG PO CAPS
300.0000 mg | ORAL_CAPSULE | Freq: Every day | ORAL | Status: DC
  Administered 2013-09-27: 300 mg via ORAL
  Filled 2013-09-27 (×2): qty 1

## 2013-09-27 MED ORDER — ONDANSETRON HCL 4 MG PO TABS: 4.0000 mg | ORAL_TABLET | Freq: Four times a day (QID) | ORAL | Status: DC | PRN

## 2013-09-27 MED ORDER — FERROUS SULFATE 325 (65 FE) MG PO TABS
325.0000 mg | ORAL_TABLET | Freq: Three times a day (TID) | ORAL | Status: DC
  Administered 2013-09-28 (×3): 325 mg via ORAL
  Filled 2013-09-27 (×4): qty 1

## 2013-09-27 MED ORDER — DOCUSATE SODIUM 100 MG PO CAPS
100.0000 mg | ORAL_CAPSULE | Freq: Two times a day (BID) | ORAL | Status: DC
  Administered 2013-09-28: 100 mg via ORAL
  Filled 2013-09-27 (×3): qty 1

## 2013-09-27 MED ORDER — SODIUM CHLORIDE 0.9 % IJ SOLN: 3.0000 mL | INTRAMUSCULAR | Status: DC | PRN

## 2013-09-27 MED ORDER — TORSEMIDE 20 MG PO TABS
40.0000 mg | ORAL_TABLET | Freq: Once | ORAL | Status: AC
  Administered 2013-09-27: 40 mg via ORAL
  Filled 2013-09-27: qty 2

## 2013-09-27 MED ORDER — SPIRONOLACTONE 25 MG PO TABS
25.0000 mg | ORAL_TABLET | Freq: Two times a day (BID) | ORAL | Status: DC
  Administered 2013-09-27 – 2013-09-28 (×3): 25 mg via ORAL
  Filled 2013-09-27 (×4): qty 1

## 2013-09-27 MED ORDER — SODIUM CHLORIDE 0.9 % IJ SOLN
3.0000 mL | Freq: Two times a day (BID) | INTRAMUSCULAR | Status: DC
  Administered 2013-09-28: 3 mL via INTRAVENOUS

## 2013-09-27 MED ORDER — ACETAMINOPHEN 325 MG PO TABS
650.0000 mg | ORAL_TABLET | Freq: Four times a day (QID) | ORAL | Status: DC | PRN
  Administered 2013-09-28: 650 mg via ORAL
  Filled 2013-09-27: qty 2

## 2013-09-27 MED ORDER — SUCRALFATE 1 GM/10ML PO SUSP
1.0000 g | Freq: Three times a day (TID) | ORAL | Status: DC
  Administered 2013-09-27 – 2013-09-28 (×4): 1 g via ORAL
  Filled 2013-09-27 (×6): qty 10

## 2013-09-27 MED ORDER — SORBITOL 70 % SOLN
15.0000 mL | Freq: Two times a day (BID) | Status: DC | PRN
  Filled 2013-09-27: qty 30

## 2013-09-27 MED ORDER — ALBUTEROL SULFATE (2.5 MG/3ML) 0.083% IN NEBU: 2.5000 mg | INHALATION_SOLUTION | RESPIRATORY_TRACT | Status: DC | PRN

## 2013-09-27 MED ORDER — TORSEMIDE 20 MG PO TABS
80.0000 mg | ORAL_TABLET | Freq: Two times a day (BID) | ORAL | Status: DC
  Administered 2013-09-28: 80 mg via ORAL
  Filled 2013-09-27 (×2): qty 4

## 2013-09-27 MED ORDER — POTASSIUM CHLORIDE 20 MEQ/15ML (10%) PO LIQD: 40.0000 meq | Freq: Once | ORAL | Status: DC

## 2013-09-27 MED ORDER — SODIUM CHLORIDE 0.9 % IV SOLN: 250.0000 mL | INTRAVENOUS | Status: DC | PRN

## 2013-09-27 MED ORDER — PANTOPRAZOLE SODIUM 40 MG PO TBEC
40.0000 mg | DELAYED_RELEASE_TABLET | Freq: Two times a day (BID) | ORAL | Status: DC
  Administered 2013-09-27 – 2013-09-28 (×2): 40 mg via ORAL
  Filled 2013-09-27 (×2): qty 1

## 2013-09-27 MED ORDER — ONDANSETRON HCL 4 MG/2ML IJ SOLN: 4.0000 mg | Freq: Four times a day (QID) | INTRAMUSCULAR | Status: DC | PRN

## 2013-09-27 MED ORDER — POTASSIUM CHLORIDE CRYS ER 20 MEQ PO TBCR
60.0000 meq | EXTENDED_RELEASE_TABLET | Freq: Once | ORAL | Status: AC
  Administered 2013-09-28: 60 meq via ORAL
  Filled 2013-09-27: qty 3

## 2013-09-27 MED ORDER — SODIUM CHLORIDE 0.9 % IV SOLN
10.0000 mL/h | Freq: Once | INTRAVENOUS | Status: AC
  Administered 2013-09-27: 10 mL/h via INTRAVENOUS

## 2013-09-27 MED ORDER — SODIUM CHLORIDE 0.9 % IJ SOLN
3.0000 mL | Freq: Two times a day (BID) | INTRAMUSCULAR | Status: DC
  Administered 2013-09-27 – 2013-09-28 (×2): 3 mL via INTRAVENOUS

## 2013-09-27 MED ORDER — LEVOTHYROXINE SODIUM 200 MCG PO TABS
200.0000 ug | ORAL_TABLET | Freq: Every day | ORAL | Status: DC
  Administered 2013-09-28: 200 ug via ORAL
  Filled 2013-09-27 (×3): qty 1

## 2013-09-27 MED ORDER — SODIUM CHLORIDE 0.9 % IV SOLN
INTRAVENOUS | Status: AC
  Administered 2013-09-27: 17:00:00 via INTRAVENOUS

## 2013-09-27 MED ORDER — POTASSIUM CHLORIDE CRYS ER 20 MEQ PO TBCR
60.0000 meq | EXTENDED_RELEASE_TABLET | Freq: Once | ORAL | Status: AC
  Administered 2013-09-27: 60 meq via ORAL
  Filled 2013-09-27: qty 3

## 2013-09-27 MED ORDER — LORAZEPAM 1 MG PO TABS: 1.0000 mg | ORAL_TABLET | Freq: Every evening | ORAL | Status: DC | PRN

## 2013-09-27 NOTE — H&P (Signed)
Date: 09/27/2013               Patient Name:  Nicole Hansen MRN: [id number]  DOB: [birth year] Age / Sex: 73 y.o., female   PCP: Rubie Maid, MD         Medical Service: Internal Medicine Teaching Service         Attending Physician: Dr. Bartholomew Crews, MD    First Contact: Dr. Charlott Rakes Pager: [phone]  Second Contact: Dr. Adele Barthel Pager: [phone]       After Hours (After 5p/  First Contact Pager: [phone]  weekends / holidays): Second Contact Pager: [phone]   Chief Complaint: fatigue & weakness x 2 days  History of Present Illness: Ms. Larimer is a 73 year old female with diastolic CHF, AI s/p AVR, and recent diverticular bleed who presents today with fatigue & weakness.   Today, she went to her cardiologist for follow-up of CHF and complained of fatigue. Her BP was 92/44. Labs were remarkable for Hb 6.1, Crt 1.01, pro-BNP 4532. She was recently hospitalized 8/10-8/14 for acute blood loss 2/2 GI bleed. On admission, Hb was 4.8. She received pRBC x 3 units and was discharged with Hb at 7.3.   Her diet at home includes mainly fruits and salads. She does note feeling acidity after eating salads. She denies any change in her stools (chronic "black" likely 2/2 Fe tablets), abdominal pain, back pain, nausea, vomiting, diarrhea, palpitations, dyspnea, cough, fevers, dizziness, muscle cramping.  In the ED, CXR showed mild pulmonary edema and CHF. She received pRBC x 1.   Meds: Current Facility-Administered Medications  Medication Dose Route Frequency Provider Last Rate Last Dose  . 0.9 %  sodium chloride infusion   Intravenous STAT Francine Graven, DO 50 mL/hr at 09/27/13 1707      Allergies: Allergies as of 09/27/2013 - Review Complete 09/27/2013  Allergen Reaction Noted  . Oxycodone Itching 08/30/2013  . Penicillins Rash 07/09/2013   Past Medical History  Diagnosis Date  . CHF (congestive heart failure)   . Thyroid disease   . Neuropathy   . Diverticular  disease    Past Surgical History  Procedure Laterality Date  . Tee without cardioversion N/A 08/30/2013    Procedure: TRANSESOPHAGEAL ECHOCARDIOGRAM (TEE);  Surgeon: Larey Dresser, MD;  Location: [facility] ENDOSCOPY;  Service: Cardiovascular;  Laterality: N/A;  . Givens capsule study N/A 09/18/2013    Procedure: GIVENS CAPSULE STUDY;  Surgeon: Juanita Craver, MD;  Location: [place];  Service: Endoscopy;  Laterality: N/A;   History reviewed. No pertinent family history. History   Social History  . Marital Status: Married    Spouse Name: N/A    Number of Children: N/A  . Years of Education: N/A   Occupational History  . Not on file.   Social History Main Topics  . Smoking status: Former Research scientist (life sciences)  . Smokeless tobacco: Former Systems developer    Quit date: 07/10/1984  . Alcohol Use: No  . Drug Use: No  . Sexual Activity: Not on file   Other Topics Concern  . Not on file   Social History Narrative   Lives with Husband in [place].     Review of Systems: Review of Systems  Unable to perform ROS Constitutional: Positive for malaise/fatigue.  Gastrointestinal: Negative for heartburn, nausea, vomiting, abdominal pain, diarrhea, blood in stool and melena.  Neurological: Negative for dizziness.     Physical Exam: Blood pressure 106/55, pulse 89, temperature 97.2 F (36.2 C), temperature source Axillary,  resp. rate 20, height 5\' 7"  (1.702 m), weight 171 lb 8 oz (77.792 kg), SpO2 100.00%.  General: resting in bed, NAD HEENT: PERRL, EOMI, no scleral icterus Cardiac: RRR, no rubs, murmurs or gallops Pulm: clear to auscultation bilaterally, no wheezes, rales, or rhonchi Abd: soft, nontender, nondistended, BS present Ext: warm and well perfused, no pedal edema Neuro: alert and oriented X3, cranial nerves II-XII grossly intact, strength and sensation to light touch equal in bilateral upper and lower extremities   Lab results: Basic Metabolic Panel:  Recent Labs  09/27/13 0935  NA 132*    K 2.7*  CL 89*  CO2 24  GLUCOSE 111*  BUN 38*  CREATININE 1.01  CALCIUM 8.7   CBC:  Recent Labs  09/27/13 1051  WBC 6.0  HGB 6.1*  HCT 19.2*  MCV 88.9  PLT 145*   BNP:  Recent Labs  09/27/13 0935  PROBNP 4532.0*   Anemia Panel:  Recent Labs  09/27/13 1342  RETICCTPCT 7.8*   Imaging results:  Dg Chest Port 1 View  09/27/2013   CLINICAL DATA:  CHF, anemia, hypertension, history diverticulitis  EXAM: PORTABLE CHEST - 1 VIEW  COMPARISON:  Portable exam 1314 hr compared to 09/17/2013  FINDINGS: Enlargement of cardiac silhouette post CABG and AVR.  Atherosclerotic calcification of a tortuous thoracic aorta.  Slight pulmonary vascular congestion.  Interstitial infiltrates compatible with mild pulmonary edema and CHF, slightly improved.  No gross pleural effusion or pneumothorax.  IMPRESSION: Slightly improved CHF.   Electronically Signed   By: Lavonia Dana M.D.   On: 09/27/2013 13:25    Assessment & Plan by Problem: Ms. Hainline is a 73 year old female with diastolic CHF, AI s/p AVR, hypothyroidism, and recent diverticular bleed who presents today with fatigue, weakness likely 2/2 hypokalemia and anemia.   #Fatigue & weakness: Hb is down 1 point and K 2.7 both of which could be causes. GI bleed is a possible cause though she does not report symptoms correlating with it. Hemolytic anemia is a possibility that warrants further workup. -Check reticulocyte, LDH, haptoglobin, smear -Recheck CBC in AM -Continue Protonix 40mg  -Try carafate -Consider diet recommendations for her to reduce her risk of exacerbating her gastritis  #Diastolic CHF: Her weight today is 171 vs 177 at her last discharge. At home, her weight ranges 162-164. She has not received torsemide today.  -Give torsemide 40mg  tonight and resume her 80mg  BID tomorrow -Continue spironolactone 25mg  -Check BMET and replete K  -Check daily weights & monitor I&O  #Hypothyroidism: Continue Synthroid 233mcg.  #FEN:   -Diet: 2g Na  #DVT prophylaxis: -SCD  #CODE STATUS: full code   Dispo: Disposition is deferred at this time, awaiting improvement of current medical problems. Anticipated discharge in approximately 1-2 day(s).   The patient does have a current PCP Rubie Maid, MD) and does not need an [facility] hospital follow-up appointment after discharge.  The patient does not have transportation limitations that hinder transportation to clinic appointments.  Signed: Charlott Rakes, MD 09/27/2013, 5:15 PM

## 2013-09-27 NOTE — ED Provider Notes (Signed)
CSN: [id number]     Arrival date & time 09/27/13  1157 History   First MD Initiated Contact with Patient 09/27/13 1247     Chief Complaint  Patient presents with  . Anemia  . Hypotension     HPI Pt was seen at 1250. Per pt and her family, c/o gradual onset and persistence of constant generalized fatigue/weakness for the past 2 days. Pt was evaluated by her Cards MD today for routine f/u for her dx CHF and was noted to be anemic and hypotensive. Pt has significant hx of GI bleeding with multiple admissions for same. Pt denies any change in her chronic "black" stools since her hospital discharge on 09/21/13. Pt denies abd pain, no back pain, no N/V/D, no blood in stools, no CP/palpitations, no SOB/cough, no fevers.      Past Medical History  Diagnosis Date  . CHF (congestive heart failure)   . Thyroid disease   . Neuropathy   . Diverticular disease    Past Surgical History  Procedure Laterality Date  . Tee without cardioversion N/A 08/30/2013    Procedure: TRANSESOPHAGEAL ECHOCARDIOGRAM (TEE);  Surgeon: Larey Dresser, MD;  Location: [facility] ENDOSCOPY;  Service: Cardiovascular;  Laterality: N/A;  . Givens capsule study N/A 09/18/2013    Procedure: GIVENS CAPSULE STUDY;  Surgeon: Juanita Craver, MD;  Location: [facility];  Service: Endoscopy;  Laterality: N/A;    History  Substance Use Topics  . Smoking status: Former Research scientist (life sciences)  . Smokeless tobacco: Former Systems developer    Quit date: 07/10/1984  . Alcohol Use: No    Review of Systems ROS: Statement: All systems negative except as marked or noted in the HPI; Constitutional: Negative for fever and chills. +generalized weakness/fatigue.; ; Eyes: Negative for eye pain, redness and discharge. ; ; ENMT: Negative for ear pain, hoarseness, nasal congestion, sinus pressure and sore throat. ; ; Cardiovascular: Negative for chest pain, palpitations, diaphoresis, dyspnea and peripheral edema. ; ; Respiratory: Negative for cough, wheezing and stridor. ; ;  Gastrointestinal: Negative for nausea, vomiting, diarrhea, abdominal pain, blood in stool, hematemesis, jaundice and rectal bleeding. . ; ; Genitourinary: Negative for dysuria, flank pain and hematuria. ; ; Musculoskeletal: Negative for back pain and neck pain. Negative for swelling and trauma.; ; Skin: Negative for pruritus, rash, abrasions, blisters, bruising and skin lesion.; ; Neuro: Negative for headache, lightheadedness and neck stiffness. Negative for altered level of consciousness , altered mental status, extremity weakness, paresthesias, involuntary movement, seizure and syncope.      Allergies  Oxycodone and Penicillins  Home Medications   Prior to Admission medications   Medication Sig Start Date End Date Taking? Authorizing Provider  docusate sodium (COLACE) 100 MG capsule Take 1 capsule (100 mg total) by mouth 2 (two) times daily. 09/21/13  Yes Tasrif Ahmed, MD  ferrous sulfate 325 (65 FE) MG tablet Take 325 mg by mouth 3 (three) times daily with meals.    Yes Historical Provider, MD  gabapentin (NEURONTIN) 300 MG capsule Take 300 mg by mouth at bedtime.  05/31/13  Yes Historical Provider, MD  levothyroxine (SYNTHROID, LEVOTHROID) 200 MCG tablet Take 1 tablet (200 mcg total) by mouth daily before breakfast. 07/26/13  Yes Barton Dubois, MD  LORazepam (ATIVAN) 1 MG tablet Take 1 mg by mouth at bedtime as needed for anxiety or sleep. sleep 06/01/13  Yes Historical Provider, MD  pantoprazole (PROTONIX) 40 MG tablet Take 1 tablet (40 mg total) by mouth 2 (two) times daily. 09/21/13  Yes Tasrif Ahmed,  MD  potassium chloride SA (K-DUR,KLOR-CON) 20 MEQ tablet Take 20 mEq by mouth 3 (three) times daily.  07/02/13  Yes Historical Provider, MD  sorbitol 70 % SOLN Take 15 mLs by mouth 2 (two) times daily as needed for moderate constipation. 09/07/13  Yes Julious Oka, MD  spironolactone (ALDACTONE) 25 MG tablet Take 1 tablet (25 mg total) by mouth 2 (two) times daily. 09/21/13  Yes Tasrif Ahmed, MD   torsemide (DEMADEX) 20 MG tablet Take 4 tablets (80 mg total) by mouth 2 (two) times daily. 09/07/13  Yes Julious Oka, MD  acetaminophen (TYLENOL) 325 MG tablet Take 650 mg by mouth every 6 (six) hours as needed (pain).     Historical Provider, MD   BP 111/48  Pulse 85  Temp(Src) 97.8 F (36.6 C) (Oral)  Resp 15  Ht 5\' 7"  (1.702 m)  Wt 173 lb (78.472 kg)  BMI 27.09 kg/m2  SpO2 95% Physical Exam 1255: Physical examination:  Nursing notes reviewed; Vital signs and O2 SAT reviewed;  Constitutional: Well developed, Well nourished, Well hydrated, In no acute distress; Head:  Normocephalic, atraumatic; Eyes: EOMI, PERRL, No scleral icterus. Conjunctiva pale.; ENMT: Mouth and pharynx normal, Mucous membranes moist; Neck: Supple, Full range of motion, No lymphadenopathy; Cardiovascular: Regular rate and rhythm, No gallop; Respiratory: Breath sounds clear & equal bilaterally, No wheezes.  Speaking full sentences with ease, Normal respiratory effort/excursion; Chest: Nontender, Movement normal; Abdomen: Soft, Nontender, Nondistended, Normal bowel sounds; Genitourinary: No CVA tenderness; Extremities: Pulses normal, No tenderness, No edema, No calf edema or asymmetry.; Neuro: AA&Ox3, Major CN grossly intact.  Speech clear. No gross focal motor or sensory deficits in extremities.; Skin: Color pale, Warm, Dry.   ED Course  Procedures    EKG Interpretation None      MDM  MDM Reviewed: previous chart, nursing note and vitals Reviewed previous: labs Interpretation: labs and x-ray Total time providing critical care: 30-74 minutes. This excludes time spent performing separately reportable procedures and services. Consults: admitting MD   CRITICAL CARE Performed by: Alfonzo Feller Total critical care time: 35 Critical care time was exclusive of separately billable procedures and treating other patients. Critical care was necessary to treat or prevent imminent or life-threatening  deterioration. Critical care was time spent personally by me on the following activities: development of treatment plan with patient and/or surrogate as well as nursing, discussions with consultants, evaluation of patient's response to treatment, examination of patient, obtaining history from patient or surrogate, ordering and performing treatments and interventions, ordering and review of laboratory studies, ordering and review of radiographic studies, pulse oximetry and re-evaluation of patient's condition.    Results for orders placed during the hospital encounter of 09/27/13  RETICULOCYTES      Result Value Ref Range   Retic Ct Pct 7.8 (*) 0.4 - 3.1 %   RBC. 1.37 (*) 3.87 - 5.11 MIL/uL   Retic Count, Manual 106.9  19.0 - 186.0 K/uL    Dg Chest Port 1 View 09/27/2013   CLINICAL DATA:  CHF, anemia, hypertension, history diverticulitis  EXAM: PORTABLE CHEST - 1 VIEW  COMPARISON:  Portable exam 1314 hr compared to 09/17/2013  FINDINGS: Enlargement of cardiac silhouette post CABG and AVR.  Atherosclerotic calcification of a tortuous thoracic aorta.  Slight pulmonary vascular congestion.  Interstitial infiltrates compatible with mild pulmonary edema and CHF, slightly improved.  No gross pleural effusion or pneumothorax.  IMPRESSION: Slightly improved CHF.   Electronically Signed   By: Crist Infante.D.  On: 09/27/2013 13:25    Results for JAYA, LAPKA (MRN [id number]) as of 09/27/2013 14:52  Ref. Range 09/27/2013 09:35 09/27/2013 10:51  Sodium Latest Range: 137-147 mEq/L 132 (L)   Potassium Latest Range: 3.7-5.3 mEq/L 2.7 (LL)   Chloride Latest Range: 96-112 mEq/L 89 (L)   CO2 Latest Range: 19-32 mEq/L 24   BUN Latest Range: 6-23 mg/dL 38 (H)   Creatinine Latest Range: 0.50-1.10 mg/dL 1.01   Calcium Latest Range: 8.4-10.5 mg/dL 8.7   GFR calc non Af Amer Latest Range: >90 mL/min 54 (L)   GFR calc Af Amer Latest Range: >90 mL/min 62 (L)   Glucose Latest Range: 70-99 mg/dL 111 (H)   Anion gap  Latest Range: 5-15  19 (H)   Pro B Natriuretic peptide (BNP) Latest Range: 0-125 pg/mL 4532.0 (H)   WBC Latest Range: 4.0-10.5 K/uL  6.0  RBC Latest Range: 3.87-5.11 MIL/uL  2.16 (L)  Hemoglobin Latest Range: 12.0-15.0 g/dL  6.1 (LL)  HCT Latest Range: 36.0-46.0 %  19.2 (L)  MCV Latest Range: 78.0-100.0 fL  88.9  MCH Latest Range: 26.0-34.0 pg  28.2  MCHC Latest Range: 30.0-36.0 g/dL  31.8  RDW Latest Range: 11.5-15.5 %  17.2 (H)  Platelets Latest Range: 150-400 K/uL  145 (L)     1340:  H/H lower than previous. Will transfuse PRBC's. Dx and testing d/w pt and family.  Questions answered.  Verb understanding, agreeable to admit.  T/C to [facility] Resident, case discussed, including:  HPI, pertinent PM/SHx, VS/PE, dx testing, ED course and treatment:  Agreeable to admit, requests to write temporary orders, obtain tele bed to Dr. Zenovia Jarred service.     Francine Graven, DO 09/29/13 1503

## 2013-09-27 NOTE — ED Notes (Signed)
Pt sent here from her cardiologist with low Hgb and hypotension. Pt pale.

## 2013-09-27 NOTE — Addendum Note (Signed)
Encounter addended by: Renee Pain, RN on: 09/27/2013  1:00 PM<BR>     Documentation filed: Inpatient MAR

## 2013-09-27 NOTE — ED Notes (Signed)
Dr. Thurnell Garbe at bedside.  Family at bedside.

## 2013-09-27 NOTE — ED Notes (Signed)
Blood consent form signed at bedside.

## 2013-09-28 DIAGNOSIS — D649 Anemia, unspecified: Secondary | ICD-10-CM

## 2013-09-28 DIAGNOSIS — R5381 Other malaise: Secondary | ICD-10-CM

## 2013-09-28 DIAGNOSIS — I5030 Unspecified diastolic (congestive) heart failure: Secondary | ICD-10-CM

## 2013-09-28 DIAGNOSIS — E876 Hypokalemia: Secondary | ICD-10-CM

## 2013-09-28 DIAGNOSIS — R5383 Other fatigue: Secondary | ICD-10-CM

## 2013-09-28 DIAGNOSIS — I509 Heart failure, unspecified: Secondary | ICD-10-CM

## 2013-09-28 DIAGNOSIS — E039 Hypothyroidism, unspecified: Secondary | ICD-10-CM

## 2013-09-28 LAB — BASIC METABOLIC PANEL
ANION GAP: 15 (ref 5–15)
Anion gap: 13 (ref 5–15)
BUN: 29 mg/dL — AB (ref 6–23)
BUN: 28 mg/dL — ABNORMAL HIGH (ref 6–23)
CHLORIDE: 90 meq/L — AB (ref 96–112)
CO2: 27 meq/L (ref 19–32)
CO2: 28 meq/L (ref 19–32)
CREATININE: 0.97 mg/dL (ref 0.50–1.10)
Calcium: 8.4 mg/dL (ref 8.4–10.5)
Calcium: 8.5 mg/dL (ref 8.4–10.5)
Chloride: 91 mEq/L — ABNORMAL LOW (ref 96–112)
Creatinine, Ser: 0.87 mg/dL (ref 0.50–1.10)
GFR calc Af Amer: 75 mL/min — ABNORMAL LOW (ref >90)
GFR calc Af Amer: 66 mL/min — ABNORMAL LOW (ref >90)
GFR calc non Af Amer: 65 mL/min — ABNORMAL LOW (ref >90)
GFR calc non Af Amer: 57 mL/min — ABNORMAL LOW (ref >90)
GLUCOSE: 93 mg/dL (ref 70–99)
Glucose, Bld: 93 mg/dL (ref 70–99)
POTASSIUM: 2.9 meq/L — AB (ref 3.7–5.3)
Potassium: 3.5 mEq/L — ABNORMAL LOW (ref 3.7–5.3)
SODIUM: 131 meq/L — AB (ref 137–147)
Sodium: 133 mEq/L — ABNORMAL LOW (ref 137–147)

## 2013-09-28 LAB — CBC
HEMATOCRIT: 21.2 % — AB (ref 36.0–46.0)
HEMOGLOBIN: 6.8 g/dL — AB (ref 12.0–15.0)
MCH: 29.1 pg (ref 26.0–34.0)
MCHC: 32.1 g/dL (ref 30.0–36.0)
MCV: 90.6 fL (ref 78.0–100.0)
Platelets: 140 10*3/uL — ABNORMAL LOW (ref 150–400)
RBC: 2.34 MIL/uL — ABNORMAL LOW (ref 3.87–5.11)
RDW: 17.4 % — ABNORMAL HIGH (ref 11.5–15.5)
WBC: 5.1 10*3/uL (ref 4.0–10.5)

## 2013-09-28 LAB — MAGNESIUM: Magnesium: 2.2 mg/dL (ref 1.5–2.5)

## 2013-09-28 LAB — PREPARE RBC (CROSSMATCH)

## 2013-09-28 LAB — PATHOLOGIST SMEAR REVIEW

## 2013-09-28 MED ORDER — POTASSIUM CHLORIDE CRYS ER 20 MEQ PO TBCR
40.0000 meq | EXTENDED_RELEASE_TABLET | ORAL | Status: AC
  Administered 2013-09-28 (×2): 40 meq via ORAL
  Filled 2013-09-28 (×2): qty 2

## 2013-09-28 MED ORDER — FUROSEMIDE 10 MG/ML IJ SOLN
INTRAMUSCULAR | Status: AC
  Administered 2013-09-28: 20 mg
  Filled 2013-09-28: qty 4

## 2013-09-28 MED ORDER — SODIUM CHLORIDE 0.9 % IV SOLN
Freq: Once | INTRAVENOUS | Status: AC
  Administered 2013-09-28: 14:00:00 via INTRAVENOUS

## 2013-09-28 MED ORDER — SUCRALFATE 1 GM/10ML PO SUSP: 1.0000 g | Freq: Three times a day (TID) | ORAL | Status: AC

## 2013-09-28 MED ORDER — SODIUM CHLORIDE 0.9 % IV SOLN
INTRAVENOUS | Status: DC
  Administered 2013-09-27: 23:00:00 via INTRAVENOUS

## 2013-09-28 MED ORDER — FUROSEMIDE 10 MG/ML IJ SOLN
20.0000 mg | Freq: Once | INTRAMUSCULAR | Status: AC
  Administered 2013-09-28: 20 mg via INTRAVENOUS

## 2013-09-28 MED ORDER — POTASSIUM CHLORIDE CRYS ER 20 MEQ PO TBCR: 20.0000 meq | EXTENDED_RELEASE_TABLET | Freq: Once | ORAL | Status: DC

## 2013-09-28 NOTE — Progress Notes (Signed)
  Date: 09/28/2013  Patient name: Nicole Hansen record number: [id number]  Date of birth: 1940/05/15   I have seen and evaluated Arlis Porta and discussed their care with the Residency Team. Ms Cumbo is well known to the IM team. She was admitted with acute on chronic anemia (6.1) and hypokalemia (2.7). Her K is being replaced and she got 1 unit PRBC with post transfusion HgB 7.3, However, she had a subsequent HgB which was 6.8. Her prior to this hospitalization W/U has inc an EGD which showed gastric polyps and a capsule showing gastritis and esophagitis. Pt is anxious to return home and F/U as an outpt.   On exam, she is in NAD. She has a syst murmur. Chronic LE venous edema and skin changes.   Assessment and Plan: I have seen and evaluated the patient as outlined above. I agree with the formulated Assessment and Plan as detailed in the residents' admission note, with the following changes:   1. Acute on chronic anemia likely 2/2 UGI bleed - consider Heyde's syndrome as pt has mild to moderate AS. Regardless, pt has not had a reversible bleeding site located on study. She is hemodynamically stable. Her HgB decreased 0.5 grams after transfusion so one additional unit PRBC will be given. Afterwards, she may be D/C'd home and F/U outpt.   2. Hypokalemia - replete and F/U outpt.  Bartholomew Crews, MD 8/21/20151:09 PM

## 2013-09-28 NOTE — Progress Notes (Signed)
Utilization review completed. Danette Weinfeld, RN, BSN. 

## 2013-09-28 NOTE — Care Management Note (Addendum)
  Page 1 of 1   09/28/2013     5:56:49 PM CARE MANAGEMENT NOTE 09/28/2013  Patient:  Nicole Hansen, Nicole Hansen   Account Number:  [id number]  Date Initiated:  09/28/2013  Documentation initiated by:  Mariann Laster  Subjective/Objective Assessment:   symptomatic anemia, GI Bleed     Action/Plan:   CM to follow for disposition needs   Anticipated DC Date:  09/28/2013   Anticipated DC Plan:  HOME/SELF CARE         Choice offered to / List presented to:             Status of service:  Completed, signed off Medicare Important Message given?   (If response is "NO", the following Medicare IM given date fields will be blank) Date Medicare IM given:   Medicare IM given by:   Date Additional Medicare IM given:   Additional Medicare IM given by:    Discharge Disposition:  HOME/SELF CARE  Per UR Regulation:  Reviewed for med. necessity/level of care/duration of stay  If discussed at [place] of Stay Meetings, dates discussed:    Comments:  Shaela Boer RN, BSN, MSHL, CCM  Nurse - Case Manager,  (Unit [place])  [phone]  09/28/2013 Social:  From home with husband.  Supportive DTR PT RECS:  None HFC:  Yes Dispsotion Plan:  home/self care.

## 2013-09-28 NOTE — Progress Notes (Signed)
Patient is discharged to home. Daughter is here to assist with transport. Pt leaving unit via wheelchair. DC IV and tele. VSS, pt has no complaints, no signs and symptoms of distress. F/U appts and new prescriptions explained. Pt verbalizes understanding.

## 2013-09-28 NOTE — Progress Notes (Signed)
Subjective: Feels better. No complaints. Wants to go home and have hemoglobin monitored and PRN transfusion outpatient. Discussed that we will talk to GI and also her PCP to see if that's reasonable.  Denies cp/sob/n/v/melena/hematochezia/hemoptysis.   Objective: Vital signs in last 24 hours: Filed Vitals:   09/27/13 2019 09/28/13 0054 09/28/13 0511 09/28/13 1000  BP: 110/53 94/42 98/68  95/55  Pulse: 97 90 86 77  Temp:  98 F (36.7 C) 98.6 F (37 C)   TempSrc:  Oral Oral   Resp:  16 18   Height:      Weight:   77.837 kg (171 lb 9.6 oz)   SpO2:  98% 100%    Weight change:   Intake/Output Summary (Last 24 hours) at 09/28/13 1041 Last data filed at 09/28/13 0908  Gross per 24 hour  Intake    580 ml  Output   1026 ml  Net   -446 ml   Vitals reviewed. General: resting in bed, NAD HEENT: PERRL, EOMI, no scleral icterus Cardiac: RRR, no rubs, systolic ejection murmur. Pulm: clear to auscultation bilaterally, no wheezes, rales, or rhonchi Abd: soft, nontender, nondistended, BS present Ext: warm and well perfused, 1+ pedal edema (better than past) Neuro: alert and oriented X3, cranial nerves II-XII grossly intact, strength and sensation to light touch equal in bilateral upper and lower extremities  Lab Results: Basic Metabolic Panel:  Recent Labs Lab 09/27/13 0935 09/28/13 0345  NA 132* 131*  K 2.7* 2.9*  CL 89* 91*  CO2 24 27  GLUCOSE 111* 93  BUN 38* 29*  CREATININE 1.01 0.87  CALCIUM 8.7 8.4   Liver Function Tests: No results found for this basename: AST, ALT, ALKPHOS, BILITOT, PROT, ALBUMIN,  in the last 168 hours No results found for this basename: LIPASE, AMYLASE,  in the last 168 hours No results found for this basename: AMMONIA,  in the last 168 hours CBC:  Recent Labs Lab 09/27/13 1051 09/27/13 2149  WBC 6.0 6.8  HGB 6.1* 7.3*  HCT 19.2* 22.3*  MCV 88.9 89.2  PLT 145* 154   Cardiac Enzymes: No results found for this basename: CKTOTAL, CKMB,  CKMBINDEX, TROPONINI,  in the last 168 hours BNP:  Recent Labs Lab 09/27/13 0935  PROBNP 4532.0*  Anemia Panel:  Recent Labs Lab 09/27/13 1342  RETICCTPCT 7.8*   Urine Drug Screen: Drugs of Abuse  No results found for this basename: labopia, cocainscrnur, labbenz, amphetmu, thcu, labbarb    Alcohol Level: No results found for this basename: ETH,  in the last 168 hours Urinalysis: No results found for this basename: COLORURINE, APPERANCEUR, LABSPEC, PHURINE, GLUCOSEU, HGBUR, BILIRUBINUR, KETONESUR, PROTEINUR, UROBILINOGEN, NITRITE, LEUKOCYTESUR,  in the last 168 hours  Micro Results: No results found for this or any previous visit (from the past 240 hour(s)). Studies/Results: Dg Chest Port 1 View  09/27/2013   CLINICAL DATA:  CHF, anemia, hypertension, history diverticulitis  EXAM: PORTABLE CHEST - 1 VIEW  COMPARISON:  Portable exam 1314 hr compared to 09/17/2013  FINDINGS: Enlargement of cardiac silhouette post CABG and AVR.  Atherosclerotic calcification of a tortuous thoracic aorta.  Slight pulmonary vascular congestion.  Interstitial infiltrates compatible with mild pulmonary edema and CHF, slightly improved.  No gross pleural effusion or pneumothorax.  IMPRESSION: Slightly improved CHF.   Electronically Signed   By: Lavonia Dana M.D.   On: 09/27/2013 13:25   Medications: I have reviewed the patient's current medications. Scheduled Meds: . docusate sodium  100 mg Oral BID  .  ferrous sulfate  325 mg Oral TID WC  . gabapentin  300 mg Oral QHS  . levothyroxine  200 mcg Oral QAC breakfast  . pantoprazole  40 mg Oral BID  . sodium chloride  3 mL Intravenous Q12H  . sodium chloride  3 mL Intravenous Q12H  . spironolactone  25 mg Oral BID  . sucralfate  1 g Oral TID WC & HS  . torsemide  80 mg Oral BID   Continuous Infusions: . sodium chloride 10 mL/hr at 09/27/13 2311   PRN Meds:.sodium chloride, acetaminophen, albuterol, LORazepam, ondansetron (ZOFRAN) IV, ondansetron,  sodium chloride, sorbitol Assessment/Plan:  73 yo female with hx of dCHF, gastritis/esophagitis, comes in with fatigue likely due to recurrent symptomatic anemia 2/2 to GI bleed.  Symptomatic anemia likely 2/2 to GI bleed - has hx of GI bleed but not having obvious bleeding currently. Does have hx of gastritits and esophagitis. Has been on PPI but still dropping hgb. This will be problematic since she has recurrent admissions with low hgb without clear reason. Capsule study last admission didn't show active source of bleeding. Could have AVN with hx of AS.  Hemolysis labs including ldl,haptoglobin,smear negative. Normal retic.  - will discuss with GI for further rec in terms of repeating EGD. GI Dr. Benson Norway recommends giving 1 more unit of blood and sending home with close GI follow up.  - has outpatient appt at GI office on August 25th, 2015 at [facility]  - has PCP appointment August 24th to check CBC. - continue protonix BID  Diastolic CHF- ECHO TEE 08/13/21 EF 60-65%   Currently lower than her discharge weight of 177. Today 171 weight.   - continue home diuretic: torsemide 80mg  BID, spironolactone 25mg  BID - replete K+, takes K at home but still low. May be difficulty chewing tablet but discussed importance of taking it. - checking mag and replete as needed. - continue to check daily weight and monitor I&O - diuresing well currently - f/up with heart failure clinic as she was doing  Hypokalemia  - replete K+, has low K+ chronically.  - spirolactone 25mg  BID.  hypothyroidism - continue synthyroid 298mcg.   #FEN:  -Diet: 2g Na  #DVT prophylaxis: -SCD #CODE STATUS: full code   Dispo: Disposition is deferred at this time, awaiting improvement of current medical problems.  Anticipated discharge in approximately 1 day(s).   The patient does have a current PCP Rubie Maid, MD) and does need an [facility] hospital follow-up appointment after discharge.  The patient does not have  transportation limitations that hinder transportation to clinic appointments.  .Services Needed at time of discharge: Y = Yes, Blank = No PT:   OT:   RN:   Equipment:   Other:     LOS: 1 day   Dellia Nims, MD 09/28/2013, 10:41 AM

## 2013-09-28 NOTE — Discharge Instructions (Signed)
It was a pleasure taking care of you. You were admitted with symptomatic anemia. We transfused 1 unit of blood and your hemoglobin is now 7.3. We also gave you potassium as it was low. You should follow up with your GI and primary care doctor very closely.

## 2013-09-28 NOTE — Evaluation (Signed)
Physical Therapy Evaluation Patient Details Name: Nicole Hansen MRN: [id number] DOB: 08-04-1940 Today's Date: 09/28/2013   History of Present Illness  73 y/o F with Diastolic HF, AI s/p AVR, recent diverticular bleed who pesents with 2 days of general fatigue and malaise. She hasn't been doing well since Saturday PTA and it progressively worsened until day of admission. She could barely talk to her family and felt tired and weak. She noticed black stool for 2 days. Has been feeling dizzy. Her admission Hgb was 4.8 and SBP 70. She received 1 unit blood and BP raised to 90's.   Clinical Impression  Pt admitted with the above. Pt currently with functional limitations due to the deficits listed below (see PT Problem List). At the time of PT eval pt states she feels very close to her baseline of function. No unsteadiness or LOB noted during gait training. Pt will benefit from skilled PT to increase their independence and safety with mobility to allow discharge to the venue listed below.    Follow Up Recommendations Supervision for mobility/OOB;No PT follow up    Equipment Recommendations  None recommended by PT    Recommendations for Other Services       Precautions / Restrictions Precautions Precautions: Fall Restrictions Weight Bearing Restrictions: No      Mobility  Bed Mobility               General bed mobility comments: Pt sitting EOB upon PT arrival.   Transfers Overall transfer level: Needs assistance Equipment used: Rolling walker (2 wheeled) Transfers: Sit to/from Stand Sit to Stand: Supervision         General transfer comment: VC's for hand placement on seated surface for safety.   Ambulation/Gait Ambulation/Gait assistance: Supervision Ambulation Distance (Feet): 175 Feet Assistive device: Rolling walker (2 wheeled);None Gait Pattern/deviations: Step-through pattern;Decreased stride length Gait velocity: decreased Gait velocity interpretation: Below normal  speed for age/gender General Gait Details: Pt has more difficulty clearing floor with L foot vs. R foot. No unsteadiness or foot drop noted, however there was a notable difference in swing-through. Pt states this is due to her peripheral neuropathy.   Stairs            Wheelchair Mobility    Modified Rankin (Stroke Patients Only)       Balance Overall balance assessment: Needs assistance Sitting-balance support: Feet supported;No upper extremity supported Sitting balance-Leahy Scale: Good     Standing balance support: No upper extremity supported Standing balance-Leahy Scale: Fair Standing balance comment: Pt able to stand statically without UE assist.                              Pertinent Vitals/Pain Pain Assessment: No/denies pain    Home Living Family/patient expects to be discharged to:: Private residence Living Arrangements: Spouse/significant other;Children Available Help at Discharge: Family;Available 24 hours/day Type of Home: House Home Access: Stairs to enter Entrance Stairs-Rails: Right Entrance Stairs-Number of Steps: 3 Home Layout: One level Home Equipment: Walker - 2 wheels;Cane - single point;Bedside commode;Toilet riser;Shower seat;Grab bars - tub/shower      Prior Function Level of Independence: Independent with assistive device(s);Needs assistance   Gait / Transfers Assistance Needed: Uses RW/cane for ambulation.           Hand Dominance   Dominant Hand: Right    Extremity/Trunk Assessment   Upper Extremity Assessment: Overall WFL for tasks assessed;Defer to OT evaluation  Lower Extremity Assessment: Overall WFL for tasks assessed;LLE deficits/detail      Cervical / Trunk Assessment: Normal  Communication   Communication: No difficulties  Cognition Arousal/Alertness: Awake/alert Behavior During Therapy: WFL for tasks assessed/performed Overall Cognitive Status: Within Functional Limits for tasks  assessed                      General Comments      Exercises        Assessment/Plan    PT Assessment Patient needs continued PT services  PT Diagnosis Difficulty walking;Generalized weakness   PT Problem List Decreased strength;Decreased activity tolerance;Decreased balance;Decreased mobility;Cardiopulmonary status limiting activity  PT Treatment Interventions DME instruction;Gait training;Stair training;Functional mobility training;Therapeutic activities;Therapeutic exercise;Balance training;Patient/family education   PT Goals (Current goals can be found in the Care Plan section) Acute Rehab PT Goals Patient Stated Goal: "to get better" PT Goal Formulation: With patient/family Time For Goal Achievement: 10/12/13 Potential to Achieve Goals: Good    Frequency Min 3X/week   Barriers to discharge        Co-evaluation               End of Session Equipment Utilized During Treatment: Gait belt Activity Tolerance: Patient tolerated treatment well Patient left: in chair;with call bell/phone within reach Nurse Communication: Mobility status         Time: 0931-1019 PT Time Calculation (min): 48 min   Charges:   PT Evaluation $Initial PT Evaluation Tier I: 1 Procedure PT Treatments $Gait Training: 8-22 mins $Therapeutic Activity: 23-37 mins   PT G CodesJolyn Lent 09/28/2013, 1:44 PM  Jolyn Lent, PT, DPT Acute Rehabilitation Services Pager: [phone]

## 2013-09-29 LAB — TYPE AND SCREEN
ABO/RH(D): O POS
ANTIBODY SCREEN: NEGATIVE
UNIT DIVISION: 0
Unit division: 0

## 2013-09-30 NOTE — Discharge Summary (Signed)
Name: Nicole Hansen MRN: [id number] DOB: 1941/01/14 73 y.o. PCP: Rubie Maid, MD  Date of Admission: 09/27/2013 12:40 PM Date of Discharge: 09/30/2013 Attending Physician: No att. providers found  Discharge Diagnosis: 1.  Principal Problem:   Symptomatic anemia Active Problems:   Hypotension   Hypokalemia   Chronic diastolic heart failure   Aortic stenosis  Discharge Medications:   Medication List         acetaminophen 325 MG tablet  Commonly known as:  TYLENOL  Take 650 mg by mouth every 6 (six) hours as needed (pain).     docusate sodium 100 MG capsule  Commonly known as:  COLACE  Take 1 capsule (100 mg total) by mouth 2 (two) times daily.     ferrous sulfate 325 (65 FE) MG tablet  Take 325 mg by mouth 3 (three) times daily with meals.     gabapentin 300 MG capsule  Commonly known as:  NEURONTIN  Take 300 mg by mouth at bedtime.     levothyroxine 200 MCG tablet  Commonly known as:  SYNTHROID, LEVOTHROID  Take 1 tablet (200 mcg total) by mouth daily before breakfast.     LORazepam 1 MG tablet  Commonly known as:  ATIVAN  Take 1 mg by mouth at bedtime as needed for anxiety or sleep. sleep     pantoprazole 40 MG tablet  Commonly known as:  PROTONIX  Take 1 tablet (40 mg total) by mouth 2 (two) times daily.     potassium chloride SA 20 MEQ tablet  Commonly known as:  K-DUR,KLOR-CON  Take 20 mEq by mouth 3 (three) times daily.     sorbitol 70 % Soln  Take 15 mLs by mouth 2 (two) times daily as needed for moderate constipation.     spironolactone 25 MG tablet  Commonly known as:  ALDACTONE  Take 1 tablet (25 mg total) by mouth 2 (two) times daily.     sucralfate 1 GM/10ML suspension  Commonly known as:  CARAFATE  Take 10 mLs (1 g total) by mouth 4 (four) times daily -  with meals and at bedtime.     torsemide 20 MG tablet  Commonly known as:  DEMADEX  Take 4 tablets (80 mg total) by mouth 2 (two) times daily.        Disposition and follow-up:     Nicole Hansen was discharged from [facility] in Stable condition.  At the hospital follow up visit please address:  1.  She needs GI workup outpatient for her recurrent GI bleed and symptomatic anemia likely 2/2 to that.  2.  Labs / imaging needed at time of follow-up: CBC 10/01/2013 at PCP office.  3.  Pending labs/ test needing follow-up:   Follow-up Appointments:     Follow-up Information   Follow up with Rubie Maid, MD. Schedule an appointment as soon as possible for a visit on 10/01/2013. (please go to get your hemoglobin level checked.)    Specialty:  Family Medicine   Contact information:   [address] [place] [address] [phone]       Follow up with GI Doctor On 10/02/2013. (@ 3:00 PM)    Contact information:   [facility]  [address], [place], [place]  [phone]      Discharge Instructions: Discharge Instructions   Call MD for:  difficulty breathing, headache or visual disturbances    Complete by:  As directed      Call MD  for:  extreme fatigue    Complete by:  As directed      Call MD for:  persistant dizziness or light-headedness    Complete by:  As directed      Call MD for:  persistant nausea and vomiting    Complete by:  As directed      Call MD for:  temperature >100.4    Complete by:  As directed      Diet - low sodium heart healthy    Complete by:  As directed      Increase activity slowly    Complete by:  As directed            Consultations:    Procedures Performed:  Ct Head Wo Contrast  09/17/2013   CLINICAL DATA:  Dizziness and weakness.  EXAM: CT HEAD WITHOUT CONTRAST  TECHNIQUE: Contiguous axial images were obtained from the base of the skull through the vertex without intravenous contrast.  COMPARISON:  05/17/2013  FINDINGS: Ventricles, cisterns and other CSF spaces are within normal. There is no mass, mass effect, shift of midline structures or acute hemorrhage. No  evidence of acute infarction. Remaining bones and soft tissues are within normal.  IMPRESSION: No acute intracranial findings.   Electronically Signed   By: Marin Olp M.D.   On: 09/17/2013 13:52   Dg Chest Port 1 View  09/27/2013   CLINICAL DATA:  CHF, anemia, hypertension, history diverticulitis  EXAM: PORTABLE CHEST - 1 VIEW  COMPARISON:  Portable exam 1314 hr compared to 09/17/2013  FINDINGS: Enlargement of cardiac silhouette post CABG and AVR.  Atherosclerotic calcification of a tortuous thoracic aorta.  Slight pulmonary vascular congestion.  Interstitial infiltrates compatible with mild pulmonary edema and CHF, slightly improved.  No gross pleural effusion or pneumothorax.  IMPRESSION: Slightly improved CHF.   Electronically Signed   By: Lavonia Dana M.D.   On: 09/27/2013 13:25   Dg Chest Port 1 View  09/17/2013   CLINICAL DATA:  One-day history of shortness of breath with a history of CHF and anemia  EXAM: PORTABLE CHEST - 1 VIEW  COMPARISON:  PA and lateral chest x-ray of August 28, 2013  FINDINGS: The lungs are adequately inflated. There is no focal infiltrate. The interstitial markings are mildly increased. The costophrenic angles remain sharp. The cardiac silhouette is mildly enlarged and the central pulmonary vascularity is engorged. The patient has undergone previous CABG. There are mild degenerative changes of the left shoulder.  IMPRESSION: Congestive heart failure with mild pulmonary interstitial edema. The pleural effusion on the right has cleared since the study of August 28, 2013.   Electronically Signed   By: David  Martinique   On: 09/17/2013 17:56    2D Echo:   Cardiac Cath:   Admission HPI:   Ms. Jantz is a 73 year old female with diastolic CHF, AI s/p AVR, and recent diverticular bleed who presents today with fatigue & weakness.  Today, she went to her cardiologist for follow-up of CHF and complained of fatigue. Her BP was 92/44. Labs were remarkable for Hb 6.1, Crt 1.01, pro-BNP  4532. She was recently hospitalized 8/10-8/14 for acute blood loss 2/2 GI bleed. On admission, Hb was 4.8. She received pRBC x 3 units and was discharged with Hb at 7.3.  Her diet at home includes mainly fruits and salads. She does note feeling acidity after eating salads. She denies any change in her stools (chronic "black" likely 2/2 Fe tablets), abdominal pain, back pain, nausea,  vomiting, diarrhea, palpitations, dyspnea, cough, fevers, dizziness, muscle cramping.  In the ED, CXR showed mild pulmonary edema and CHF. She received pRBC x 1.    Hospital Course by problem list:  74 yo female with hx of dCHF, gastritis/esophagitis, comes in with fatigue likely due to recurrent symptomatic anemia 2/2 to GI bleed.  Symptomatic anemia likely 2/2 to GI bleed - has hx of GI bleed but not having obvious bleeding currently. Does have hx of gastritits and esophagitis. Has been on PPI but still dropping hgb. This will be problematic since she has recurrent admissions with low hgb without clear reason. Capsule study last admission didn't show active source of bleeding. Could have AVN with hx of AS.  Hemolysis labs including ldl,haptoglobin,smear negative. Normal retic.  - discussed with GI for further rec in terms of repeating EGD. GI Dr. Benson Norway recommends giving 1 more unit of blood and sending home with close GI follow up.  - has outpatient appt at GI office on August 25th, 2015 at [facility].  - has PCP appointment August 24th to check CBC.  - continue protonix BID  Diastolic CHF- ECHO TEE 0/16/01 EF 60-65%  Currently lower than her discharge weight of 177. Today 171 weight.  - continue home diuretic: torsemide 80mg  BID, spironolactone 25mg  BID  - replete K+, takes K at home but still low. May be difficulty chewing tablet but discussed importance of taking it.  - checking mag and replete as needed.  - continue to check daily weight and monitor I&O - diuresing well currently  - f/up with heart failure clinic as she  was doing  Hypokalemia  - replete K+, has low K+ chronically.  - spirolactone 25mg  BID.  hypothyroidism - continue synthyroid 226mcg.    Discharge Vitals:   BP 111/64  Pulse 87  Temp(Src) 97.1 F (36.2 C) (Oral)  Resp 18  Ht 5\' 7"  (1.702 m)  Wt 77.837 kg (171 lb 9.6 oz)  BMI 26.87 kg/m2  SpO2 100%  Discharge Labs:  Lab Results:  Basic Metabolic Panel:   Recent Labs  Lab  09/27/13 0935  09/28/13 0345   NA  132*  131*   K  2.7*  2.9*   CL  89*  91*   CO2  24  27   GLUCOSE  111*  93   BUN  38*  29*   CREATININE  1.01  0.87   CALCIUM  8.7  8.4    Liver Function Tests:  No results found for this basename: AST, ALT, ALKPHOS, BILITOT, PROT, ALBUMIN, in the last 168 hours  No results found for this basename: LIPASE, AMYLASE, in the last 168 hours  No results found for this basename: AMMONIA, in the last 168 hours  CBC:   Recent Labs  Lab  09/27/13 1051  09/27/13 2149   WBC  6.0  6.8   HGB  6.1*  7.3*   HCT  19.2*  22.3*   MCV  88.9  89.2   PLT  145*  154    Cardiac Enzymes:  No results found for this basename: CKTOTAL, CKMB, CKMBINDEX, TROPONINI, in the last 168 hours  BNP:   Recent Labs  Lab  09/27/13 0935   PROBNP  4532.0*   Anemia Panel:   Recent Labs  Lab  09/27/13 1342   RETICCTPCT  7.8*    Urine Drug Screen:  Drugs of Abuse  No results found for this basename: labopia, cocainscrnur, labbenz, amphetmu, thcu, labbarb  Alcohol Level:  No results found for this basename: ETH, in the last 168 hours  Urinalysis:  No results found for this basename: COLORURINE, APPERANCEUR, LABSPEC, [place], GLUCOSEU, HGBUR, BILIRUBINUR, KETONESUR, PROTEINUR, UROBILINOGEN, NITRITE, LEUKOCYTESUR, in the last 168 hours  Micro Results:  No results found for this or any previous visit (from the past 240 hour(s)).  Studies/Results:  Dg Chest Port 1 View  09/27/2013 CLINICAL DATA: CHF, anemia, hypertension, history diverticulitis EXAM: PORTABLE CHEST - 1 VIEW  COMPARISON: Portable exam 1314 hr compared to 09/17/2013 FINDINGS: Enlargement of cardiac silhouette post CABG and AVR. Atherosclerotic calcification of a tortuous thoracic aorta. Slight pulmonary vascular congestion. Interstitial infiltrates compatible with mild pulmonary edema and CHF, slightly improved. No gross pleural effusion or pneumothorax. IMPRESSION: Slightly improved CHF. Electronically Signed By: Lavonia Dana M.D. On: 09/27/2013 13:25    Signed: Dellia Nims, MD 09/30/2013, 4:02 PM    Services Ordered on Discharge:  Equipment Ordered on Discharge:

## 2013-10-10 ENCOUNTER — Other Ambulatory Visit (HOSPITAL_COMMUNITY): Payer: Self-pay | Admitting: Internal Medicine

## 2013-10-10 ENCOUNTER — Encounter (HOSPITAL_COMMUNITY): Payer: Medicare Other

## 2013-10-23 ENCOUNTER — Inpatient Hospital Stay (HOSPITAL_COMMUNITY): Admission: RE | Admit: 2013-10-23 | Payer: Medicare Other | Source: Ambulatory Visit

## 2013-12-28 ENCOUNTER — Encounter (HOSPITAL_COMMUNITY): Payer: Medicare Other

## 2015-04-04 IMAGING — CT CT HEAD W/O CM
1 of 2 series · 13 of 30 positions shown, 17 images · non-contrast
Comparison: 05/17/2013

CLINICAL DATA: Dizziness and weakness.

EXAM:
CT HEAD WITHOUT CONTRAST
TECHNIQUE: Contiguous axial images were obtained from the base of the skull
through the vertex without intravenous contrast.

[Series 2: head 5.0 h30s · axial · 0.45mm/px · z∈[-160,-25]mm · 13 of 33 slices shown, 17 images]
[im 3/33  brain]
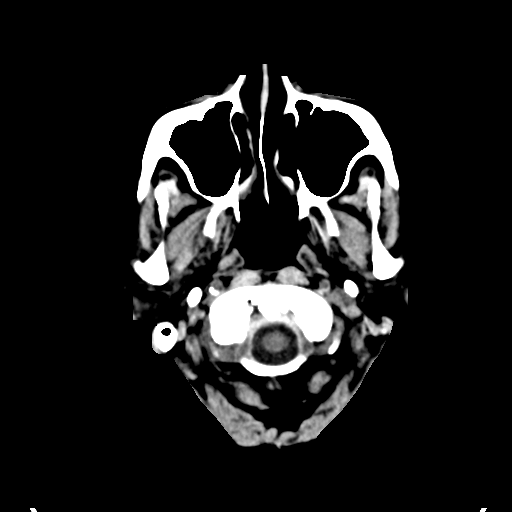
[im 3/33  bone]
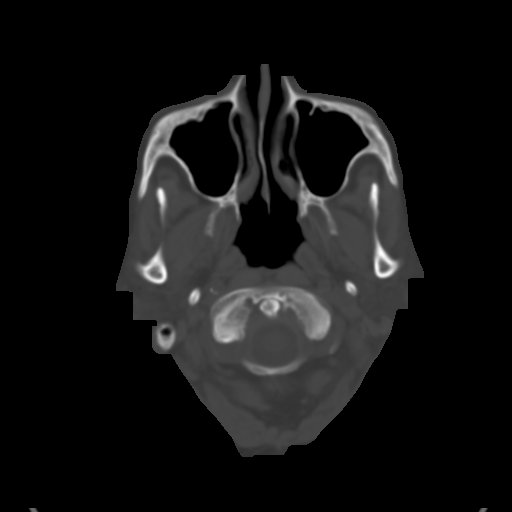
[im 5/33  brain]
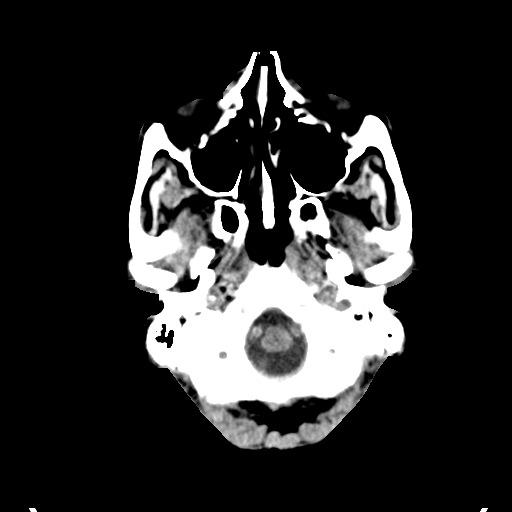
[im 7/33  brain]
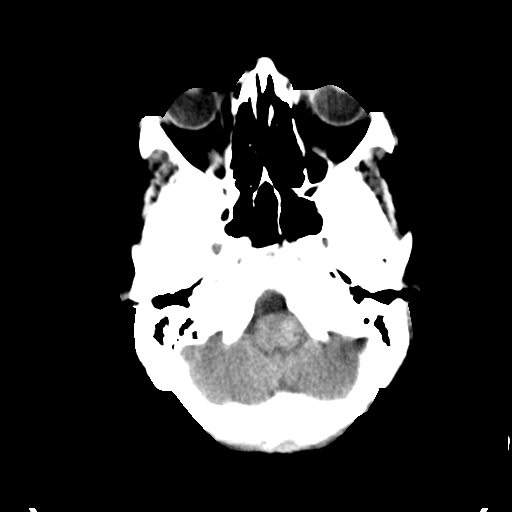
[im 10/33  brain]
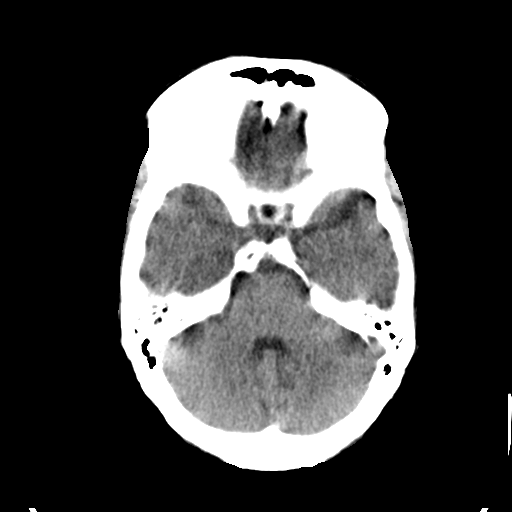
[im 12/33  brain]
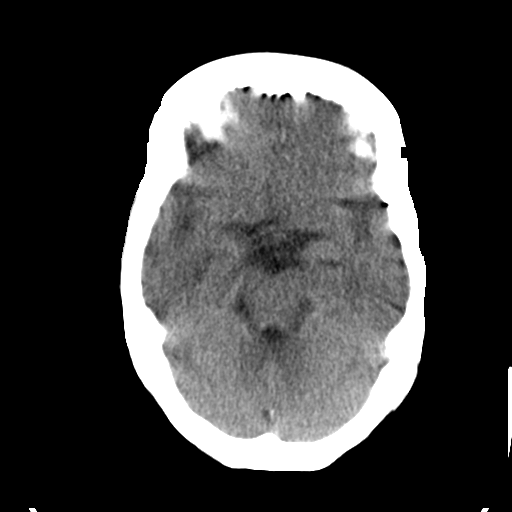
[im 12/33  bone]
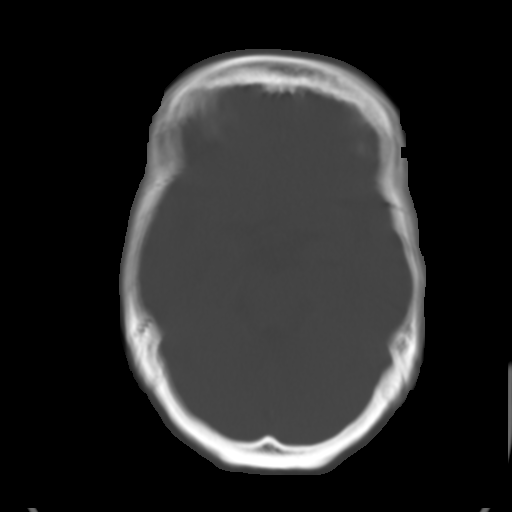
[im 14/33  brain]
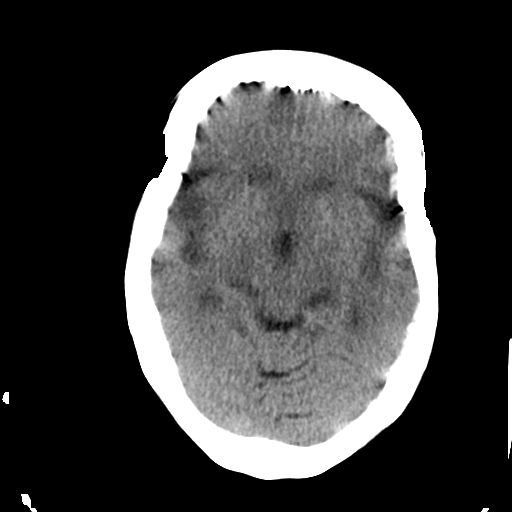
[im 17/33  brain]
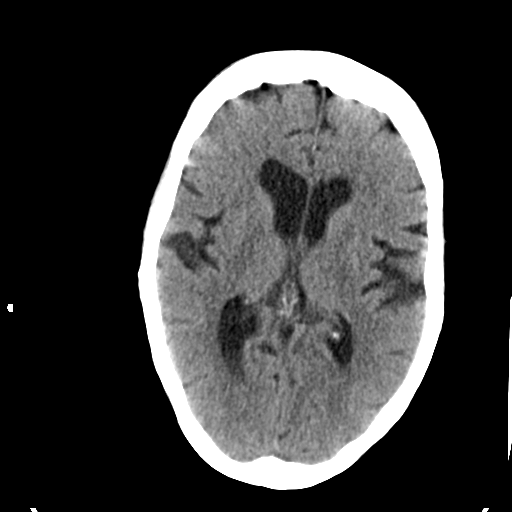
[im 19/33  brain]
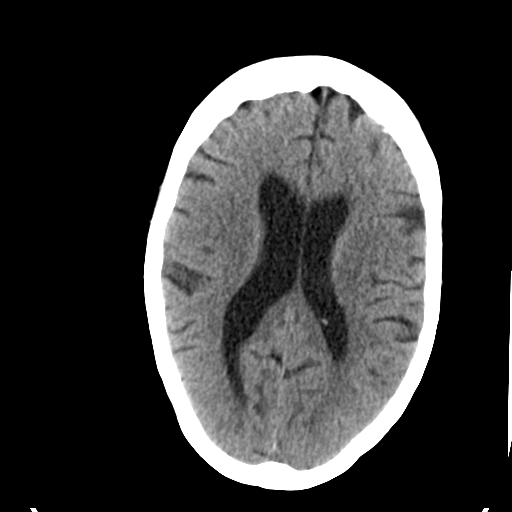
[im 21/33  brain]
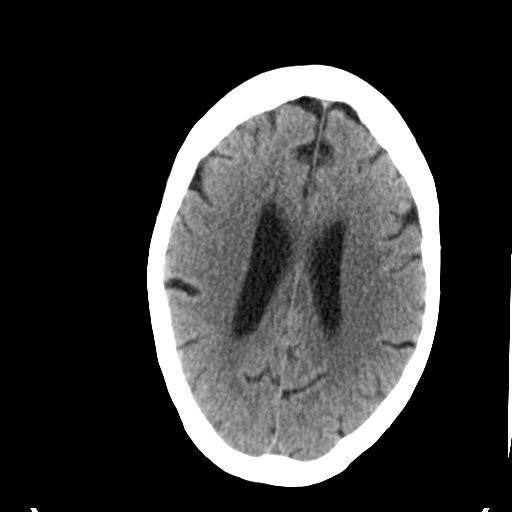
[im 21/33  bone]
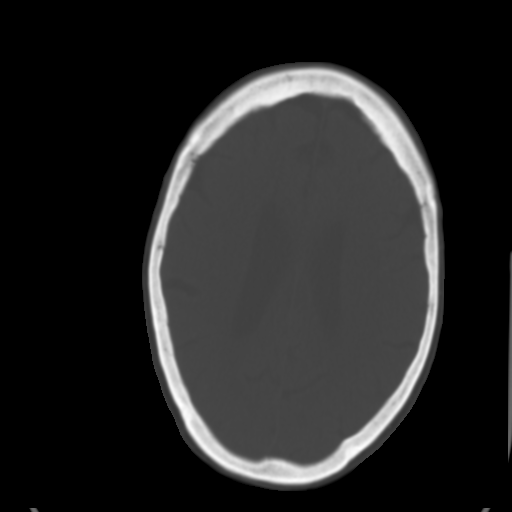
[im 23/33  brain]
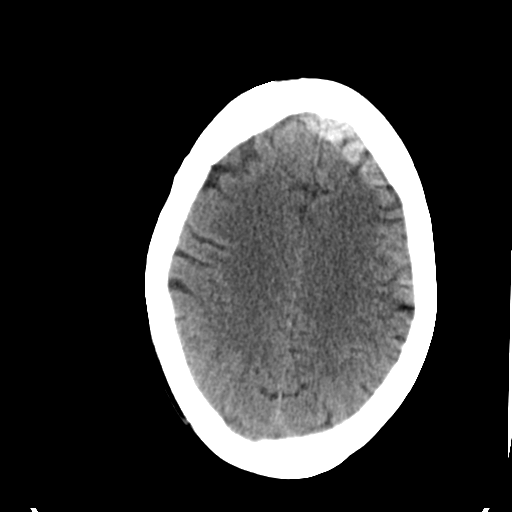
[im 26/33  brain]
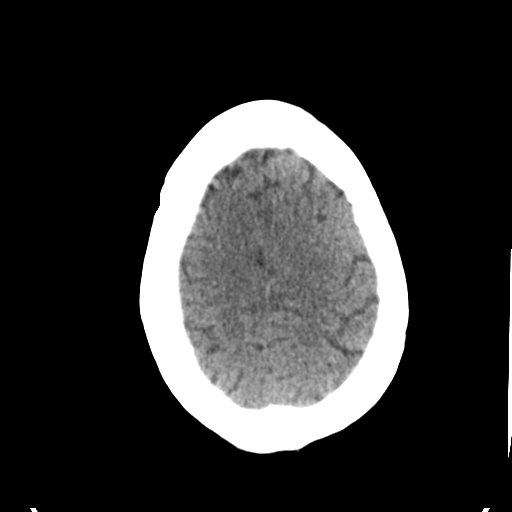
[im 28/33  brain]
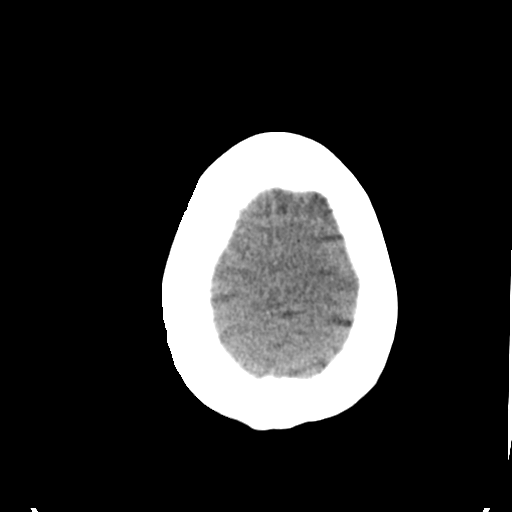
[im 30/33  brain]
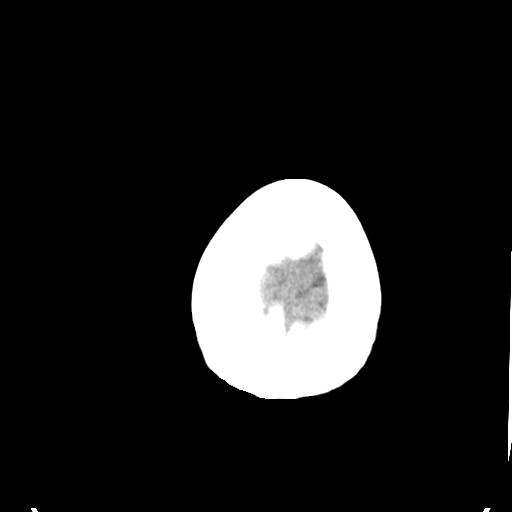
[im 30/33  bone]
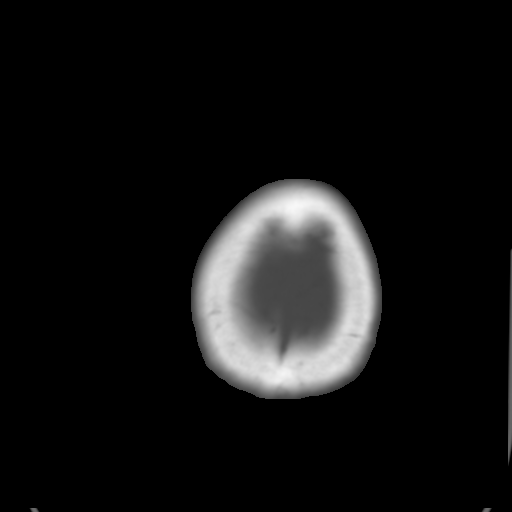

[13 of 30 positions shown; findings below may reference images not displayed]

FINDINGS: Ventricles, cisterns and other CSF spaces are within normal. There
is no mass, mass effect, shift of midline structures or acute
hemorrhage. No evidence of acute infarction. Remaining bones and
soft tissues are within normal.
IMPRESSION: No acute intracranial findings.

## 2017-10-17 ENCOUNTER — Other Ambulatory Visit (HOSPITAL_BASED_OUTPATIENT_CLINIC_OR_DEPARTMENT_OTHER): Payer: Self-pay | Admitting: Emergency Medicine

## 2017-10-17 DIAGNOSIS — R609 Edema, unspecified: Secondary | ICD-10-CM

## 2018-04-05 ENCOUNTER — Other Ambulatory Visit (HOSPITAL_COMMUNITY): Payer: Medicare Other

## 2018-04-05 ENCOUNTER — Inpatient Hospital Stay
Admission: RE | Admit: 2018-04-05 | Discharge: 2018-04-28 | Disposition: A | Discharge status: Rehabilitation Facility | Payer: Medicare Other | Source: Ambulatory Visit | Attending: Internal Medicine | Admitting: Internal Medicine

## 2018-04-05 DIAGNOSIS — K567 Ileus, unspecified: Secondary | ICD-10-CM

## 2018-04-05 DIAGNOSIS — R109 Unspecified abdominal pain: Secondary | ICD-10-CM

## 2018-04-05 DIAGNOSIS — I359 Nonrheumatic aortic valve disorder, unspecified: Secondary | ICD-10-CM

## 2018-04-06 LAB — COMPREHENSIVE METABOLIC PANEL
ALT: 14 U/L (ref 0–44)
AST: 26 U/L (ref 15–41)
Albumin: 1.8 g/dL — ABNORMAL LOW (ref 3.5–5.0)
Alkaline Phosphatase: 71 U/L (ref 38–126)
Anion gap: 10 (ref 5–15)
BUN: 7 mg/dL — ABNORMAL LOW (ref 8–23)
CHLORIDE: 101 mmol/L (ref 98–111)
CO2: 24 mmol/L (ref 22–32)
Calcium: 8.1 mg/dL — ABNORMAL LOW (ref 8.9–10.3)
Creatinine, Ser: 0.4 mg/dL — ABNORMAL LOW (ref 0.44–1.00)
GFR calc Af Amer: >60 mL/min (ref >60)
GFR calc non Af Amer: >60 mL/min (ref >60)
GLUCOSE: 73 mg/dL (ref 70–99)
POTASSIUM: 4.4 mmol/L (ref 3.5–5.1)
Sodium: 135 mmol/L (ref 135–145)
Total Bilirubin: 0.6 mg/dL (ref 0.3–1.2)
Total Protein: 4.9 g/dL — ABNORMAL LOW (ref 6.5–8.1)

## 2018-04-06 LAB — CBC WITH DIFFERENTIAL/PLATELET
Abs Immature Granulocytes: 0.02 10*3/uL (ref 0.00–0.07)
Basophils Absolute: 0 10*3/uL (ref 0.0–0.1)
Basophils Relative: 1 %
Eosinophils Absolute: 0.1 10*3/uL (ref 0.0–0.5)
Eosinophils Relative: 2 %
HCT: 29.8 % — ABNORMAL LOW (ref 36.0–46.0)
HEMOGLOBIN: 9 g/dL — AB (ref 12.0–15.0)
Immature Granulocytes: 1 %
Lymphocytes Relative: 16 %
Lymphs Abs: 0.5 10*3/uL — ABNORMAL LOW (ref 0.7–4.0)
MCH: 29.5 pg (ref 26.0–34.0)
MCHC: 30.2 g/dL (ref 30.0–36.0)
MCV: 97.7 fL (ref 80.0–100.0)
MONO ABS: 0.4 10*3/uL (ref 0.1–1.0)
MONOS PCT: 15 %
NEUTROS ABS: 2 10*3/uL (ref 1.7–7.7)
Neutrophils Relative %: 65 %
Platelets: 136 10*3/uL — ABNORMAL LOW (ref 150–400)
RBC: 3.05 MIL/uL — ABNORMAL LOW (ref 3.87–5.11)
RDW: 19.2 % — ABNORMAL HIGH (ref 11.5–15.5)
WBC: 3 10*3/uL — ABNORMAL LOW (ref 4.0–10.5)
nRBC: 0 % (ref 0.0–0.2)

## 2018-04-07 MED ORDER — MELATONIN 3 MG PO TABS
6.00 | ORAL_TABLET | ORAL | Status: DC
Start: 2018-04-05 — End: 2018-04-07

## 2018-04-07 MED ORDER — ATORVASTATIN CALCIUM 10 MG PO TABS
20.00 | ORAL_TABLET | ORAL | Status: DC
Start: 2018-04-05 — End: 2018-04-07

## 2018-04-07 MED ORDER — GABAPENTIN 300 MG PO CAPS
300.00 | ORAL_CAPSULE | ORAL | Status: DC
Start: 2018-04-05 — End: 2018-04-07

## 2018-04-07 MED ORDER — MEGESTROL ACETATE 400 MG/10ML PO SUSP
400.00 | ORAL | Status: DC
Start: 2018-04-06 — End: 2018-04-07

## 2018-04-07 MED ORDER — GENERIC EXTERNAL MEDICATION
1.00 | Status: DC
Start: 2018-04-06 — End: 2018-04-07

## 2018-04-07 MED ORDER — HEPARIN SODIUM (PORCINE) 5000 UNIT/ML IJ SOLN
5000.00 | INTRAMUSCULAR | Status: DC
Start: 2018-04-05 — End: 2018-04-07

## 2018-04-07 MED ORDER — LEVOTHYROXINE SODIUM 50 MCG PO TABS
100.00 | ORAL_TABLET | ORAL | Status: DC
Start: 2018-04-06 — End: 2018-04-07

## 2018-04-07 MED ORDER — BENZOCAINE-MENTHOL 6-10 MG MT LOZG
1.00 | LOZENGE | OROMUCOSAL | Status: DC
Start: ? — End: 2018-04-07

## 2018-04-07 MED ORDER — SODIUM CHLORIDE FLUSH 0.9 % IV SOLN
20.00 | INTRAVENOUS | Status: DC
Start: ? — End: 2018-04-07

## 2018-04-07 MED ORDER — GENERIC EXTERNAL MEDICATION
500.00 | Status: DC
Start: ? — End: 2018-04-07

## 2018-04-07 MED ORDER — GUAIFENESIN 100 MG/5ML PO SYRP
200.00 | ORAL_SOLUTION | ORAL | Status: DC
Start: ? — End: 2018-04-07

## 2018-04-07 MED ORDER — ONDANSETRON HCL 4 MG/2ML IJ SOLN
4.00 | INTRAMUSCULAR | Status: DC
Start: ? — End: 2018-04-07

## 2018-04-07 MED ORDER — ASPIRIN EC 81 MG PO TBEC
81.00 | DELAYED_RELEASE_TABLET | ORAL | Status: DC
Start: 2018-04-06 — End: 2018-04-07

## 2018-04-07 MED ORDER — SIMETHICONE 80 MG PO CHEW
80.00 | CHEWABLE_TABLET | ORAL | Status: DC
Start: 2018-04-05 — End: 2018-04-07

## 2018-04-07 MED ORDER — TAMSULOSIN HCL 0.4 MG PO CAPS
0.40 | ORAL_CAPSULE | ORAL | Status: DC
Start: 2018-04-06 — End: 2018-04-07

## 2018-04-07 MED ORDER — SENNOSIDES-DOCUSATE SODIUM 8.6-50 MG PO TABS
2.00 | ORAL_TABLET | ORAL | Status: DC
Start: 2018-04-05 — End: 2018-04-07

## 2018-04-07 MED ORDER — GENERIC EXTERNAL MEDICATION
100.00 | Status: DC
Start: 2018-04-06 — End: 2018-04-07

## 2018-04-07 MED ORDER — BISACODYL 10 MG RE SUPP
10.00 | RECTAL | Status: DC
Start: ? — End: 2018-04-07

## 2018-04-07 MED ORDER — LIDOCAINE 4 % EX PTCH
1.00 | MEDICATED_PATCH | CUTANEOUS | Status: DC
Start: 2018-04-05 — End: 2018-04-07

## 2018-04-07 MED ORDER — ACETAMINOPHEN 500 MG PO TABS
1000.00 | ORAL_TABLET | ORAL | Status: DC
Start: ? — End: 2018-04-07

## 2018-04-07 MED ORDER — GENERIC EXTERNAL MEDICATION
296.00 | Status: DC
Start: ? — End: 2018-04-07

## 2018-04-07 MED ORDER — SODIUM CHLORIDE FLUSH 0.9 % IV SOLN
20.00 | INTRAVENOUS | Status: DC
Start: 2018-04-06 — End: 2018-04-07

## 2018-04-07 MED ORDER — IPRATROPIUM-ALBUTEROL 0.5-2.5 (3) MG/3ML IN SOLN
3.00 | RESPIRATORY_TRACT | Status: DC
Start: 2018-04-05 — End: 2018-04-07

## 2018-04-07 MED ORDER — POLYETHYLENE GLYCOL 3350 17 G PO PACK
17.00 | PACK | ORAL | Status: DC
Start: 2018-04-06 — End: 2018-04-07

## 2018-04-07 MED ORDER — ALBUTEROL SULFATE (2.5 MG/3ML) 0.083% IN NEBU
2.50 | INHALATION_SOLUTION | RESPIRATORY_TRACT | Status: DC
Start: ? — End: 2018-04-07

## 2018-04-07 MED ORDER — LIDOCAINE 4 % EX PTCH
1.00 | MEDICATED_PATCH | CUTANEOUS | Status: DC
Start: 2018-04-06 — End: 2018-04-07

## 2018-04-07 MED ORDER — OSELTAMIVIR PHOSPHATE 75 MG PO CAPS
75.00 | ORAL_CAPSULE | ORAL | Status: DC
Start: 2018-04-05 — End: 2018-04-07

## 2018-04-08 LAB — BASIC METABOLIC PANEL
Anion gap: 7 (ref 5–15)
BUN: 6 mg/dL — ABNORMAL LOW (ref 8–23)
CO2: 32 mmol/L (ref 22–32)
Calcium: 7.6 mg/dL — ABNORMAL LOW (ref 8.9–10.3)
Chloride: 97 mmol/L — ABNORMAL LOW (ref 98–111)
Creatinine, Ser: 0.47 mg/dL (ref 0.44–1.00)
GFR calc non Af Amer: >60 mL/min (ref >60)
Glucose, Bld: 87 mg/dL (ref 70–99)
Potassium: 3.4 mmol/L — ABNORMAL LOW (ref 3.5–5.1)
Sodium: 136 mmol/L (ref 135–145)

## 2018-04-13 LAB — BASIC METABOLIC PANEL
Anion gap: 9 (ref 5–15)
BUN: 8 mg/dL (ref 8–23)
CO2: 28 mmol/L (ref 22–32)
Calcium: 8 mg/dL — ABNORMAL LOW (ref 8.9–10.3)
Chloride: 97 mmol/L — ABNORMAL LOW (ref 98–111)
Creatinine, Ser: 0.47 mg/dL (ref 0.44–1.00)
GFR calc Af Amer: >60 mL/min (ref >60)
GFR calc non Af Amer: >60 mL/min (ref >60)
Glucose, Bld: 83 mg/dL (ref 70–99)
Potassium: 3.7 mmol/L (ref 3.5–5.1)
Sodium: 134 mmol/L — ABNORMAL LOW (ref 135–145)

## 2018-04-20 ENCOUNTER — Other Ambulatory Visit (HOSPITAL_COMMUNITY): Payer: Medicare Other

## 2018-04-20 LAB — URINALYSIS, ROUTINE W REFLEX MICROSCOPIC
Bilirubin Urine: NEGATIVE
Glucose, UA: NEGATIVE mg/dL
Ketones, ur: NEGATIVE mg/dL
LEUKOCYTE UA: NEGATIVE
NITRITE: NEGATIVE
Protein, ur: 100 mg/dL — AB
Specific Gravity, Urine: 1.025 (ref 1.005–1.030)
pH: 5 (ref 5.0–8.0)

## 2018-04-21 LAB — CBC
HCT: 31.5 % — ABNORMAL LOW (ref 36.0–46.0)
Hemoglobin: 9.1 g/dL — ABNORMAL LOW (ref 12.0–15.0)
MCH: 28.9 pg (ref 26.0–34.0)
MCHC: 28.9 g/dL — ABNORMAL LOW (ref 30.0–36.0)
MCV: 100 fL (ref 80.0–100.0)
Platelets: 121 10*3/uL — ABNORMAL LOW (ref 150–400)
RBC: 3.15 MIL/uL — ABNORMAL LOW (ref 3.87–5.11)
RDW: 19.2 % — ABNORMAL HIGH (ref 11.5–15.5)
WBC: 5.7 10*3/uL (ref 4.0–10.5)
nRBC: 0 % (ref 0.0–0.2)

## 2018-04-21 LAB — BASIC METABOLIC PANEL
Anion gap: 7 (ref 5–15)
BUN: 12 mg/dL (ref 8–23)
CALCIUM: 8.4 mg/dL — AB (ref 8.9–10.3)
CO2: 28 mmol/L (ref 22–32)
Chloride: 100 mmol/L (ref 98–111)
Creatinine, Ser: 0.63 mg/dL (ref 0.44–1.00)
GFR calc Af Amer: >60 mL/min (ref >60)
GFR calc non Af Amer: >60 mL/min (ref >60)
Glucose, Bld: 97 mg/dL (ref 70–99)
Potassium: 4 mmol/L (ref 3.5–5.1)
Sodium: 135 mmol/L (ref 135–145)

## 2018-04-22 ENCOUNTER — Other Ambulatory Visit (HOSPITAL_COMMUNITY): Payer: Medicare Other

## 2018-04-22 LAB — URINE CULTURE: Culture: >=100000 — AB

## 2018-11-09 DEATH — deceased
